# Patient Record
Sex: Male | Born: 1935
Health system: Southern US, Community
[De-identification: ages and names within clinical notes are randomized; demographics above are authoritative.]

## PROBLEM LIST (undated history)

## (undated) DIAGNOSIS — C801 Malignant (primary) neoplasm, unspecified: Secondary | ICD-10-CM

## (undated) DIAGNOSIS — F411 Generalized anxiety disorder: Secondary | ICD-10-CM

## (undated) DIAGNOSIS — D649 Anemia, unspecified: Secondary | ICD-10-CM

## (undated) DIAGNOSIS — M1990 Unspecified osteoarthritis, unspecified site: Secondary | ICD-10-CM

## (undated) DIAGNOSIS — K219 Gastro-esophageal reflux disease without esophagitis: Secondary | ICD-10-CM

## (undated) DIAGNOSIS — I10 Essential (primary) hypertension: Secondary | ICD-10-CM

## (undated) HISTORY — DX: Malignant (primary) neoplasm, unspecified: C80.1

## (undated) HISTORY — DX: Unspecified osteoarthritis, unspecified site: M19.90

## (undated) HISTORY — DX: Gastro-esophageal reflux disease without esophagitis: K21.9

## (undated) HISTORY — DX: Anemia, unspecified: D64.9

## (undated) HISTORY — DX: Generalized anxiety disorder: F41.1

## (undated) HISTORY — DX: Essential (primary) hypertension: I10

---

## 1999-01-29 ENCOUNTER — Ambulatory Visit (HOSPITAL_COMMUNITY): Admission: RE | Admit: 1999-01-29 | Discharge: 1999-01-29 | Payer: Self-pay | Admitting: Cardiovascular Disease

## 2002-09-29 ENCOUNTER — Ambulatory Visit (HOSPITAL_COMMUNITY): Admission: RE | Admit: 2002-09-29 | Discharge: 2002-09-29 | Payer: Self-pay | Admitting: Internal Medicine

## 2002-09-29 ENCOUNTER — Encounter: Payer: Self-pay | Admitting: Internal Medicine

## 2002-10-27 ENCOUNTER — Ambulatory Visit (HOSPITAL_COMMUNITY): Admission: RE | Admit: 2002-10-27 | Discharge: 2002-10-28 | Payer: Self-pay | Admitting: Internal Medicine

## 2004-04-10 ENCOUNTER — Inpatient Hospital Stay (HOSPITAL_COMMUNITY): Admission: EM | Admit: 2004-04-10 | Discharge: 2004-04-15 | Payer: Self-pay | Admitting: Internal Medicine

## 2004-05-10 ENCOUNTER — Encounter: Admission: RE | Admit: 2004-05-10 | Discharge: 2004-05-10 | Payer: Self-pay | Admitting: Orthopedic Surgery

## 2004-06-06 ENCOUNTER — Encounter: Admission: RE | Admit: 2004-06-06 | Discharge: 2004-06-06 | Payer: Self-pay | Admitting: Orthopedic Surgery

## 2004-06-25 ENCOUNTER — Inpatient Hospital Stay (HOSPITAL_COMMUNITY): Admission: RE | Admit: 2004-06-25 | Discharge: 2004-06-29 | Payer: Self-pay | Admitting: Orthopedic Surgery

## 2004-06-25 ENCOUNTER — Encounter (INDEPENDENT_AMBULATORY_CARE_PROVIDER_SITE_OTHER): Payer: Self-pay | Admitting: Specialist

## 2004-07-13 ENCOUNTER — Encounter: Payer: Self-pay | Admitting: Internal Medicine

## 2004-07-22 ENCOUNTER — Ambulatory Visit: Payer: Self-pay | Admitting: Infectious Diseases

## 2004-07-25 ENCOUNTER — Ambulatory Visit (HOSPITAL_COMMUNITY): Admission: RE | Admit: 2004-07-25 | Discharge: 2004-07-25 | Payer: Self-pay | Admitting: Infectious Diseases

## 2004-08-12 ENCOUNTER — Ambulatory Visit: Payer: Self-pay | Admitting: Infectious Diseases

## 2004-09-16 ENCOUNTER — Ambulatory Visit: Payer: Self-pay | Admitting: Infectious Diseases

## 2004-11-18 ENCOUNTER — Ambulatory Visit: Payer: Self-pay | Admitting: Infectious Diseases

## 2004-11-21 ENCOUNTER — Ambulatory Visit: Payer: Self-pay | Admitting: Cardiology

## 2004-12-03 ENCOUNTER — Ambulatory Visit: Payer: Self-pay

## 2005-02-05 ENCOUNTER — Ambulatory Visit: Payer: Self-pay | Admitting: Cardiology

## 2005-02-20 ENCOUNTER — Ambulatory Visit: Payer: Self-pay

## 2005-03-06 ENCOUNTER — Ambulatory Visit: Payer: Self-pay | Admitting: Cardiology

## 2005-03-14 ENCOUNTER — Ambulatory Visit: Payer: Self-pay

## 2005-03-17 ENCOUNTER — Ambulatory Visit: Payer: Self-pay | Admitting: Infectious Diseases

## 2005-06-20 ENCOUNTER — Ambulatory Visit: Payer: Self-pay | Admitting: Internal Medicine

## 2005-09-22 ENCOUNTER — Ambulatory Visit: Payer: Self-pay | Admitting: Infectious Diseases

## 2006-03-30 ENCOUNTER — Ambulatory Visit: Payer: Self-pay | Admitting: Infectious Diseases

## 2006-06-24 ENCOUNTER — Ambulatory Visit: Payer: Self-pay | Admitting: Cardiology

## 2006-08-25 ENCOUNTER — Other Ambulatory Visit: Payer: Self-pay

## 2006-08-25 ENCOUNTER — Emergency Department: Payer: Self-pay | Admitting: Unknown Physician Specialty

## 2006-10-20 DIAGNOSIS — I2510 Atherosclerotic heart disease of native coronary artery without angina pectoris: Secondary | ICD-10-CM

## 2006-10-20 DIAGNOSIS — I499 Cardiac arrhythmia, unspecified: Secondary | ICD-10-CM | POA: Insufficient documentation

## 2006-10-20 DIAGNOSIS — I10 Essential (primary) hypertension: Secondary | ICD-10-CM | POA: Insufficient documentation

## 2006-10-20 DIAGNOSIS — M009 Pyogenic arthritis, unspecified: Secondary | ICD-10-CM | POA: Insufficient documentation

## 2006-10-22 ENCOUNTER — Encounter (INDEPENDENT_AMBULATORY_CARE_PROVIDER_SITE_OTHER): Payer: Self-pay | Admitting: Infectious Diseases

## 2006-11-16 ENCOUNTER — Ambulatory Visit: Payer: Self-pay | Admitting: Infectious Diseases

## 2007-04-19 ENCOUNTER — Telehealth: Payer: Self-pay | Admitting: Internal Medicine

## 2007-06-17 ENCOUNTER — Ambulatory Visit: Payer: Self-pay | Admitting: Infectious Diseases

## 2007-06-24 ENCOUNTER — Ambulatory Visit: Payer: Self-pay | Admitting: Cardiology

## 2008-08-11 ENCOUNTER — Ambulatory Visit: Payer: Self-pay | Admitting: Cardiology

## 2008-08-22 ENCOUNTER — Ambulatory Visit: Payer: Self-pay

## 2009-01-22 ENCOUNTER — Telehealth: Payer: Self-pay | Admitting: Cardiology

## 2009-05-23 ENCOUNTER — Encounter (INDEPENDENT_AMBULATORY_CARE_PROVIDER_SITE_OTHER): Payer: Self-pay | Admitting: *Deleted

## 2009-06-25 ENCOUNTER — Encounter: Payer: Self-pay | Admitting: Cardiology

## 2009-08-16 DIAGNOSIS — K219 Gastro-esophageal reflux disease without esophagitis: Secondary | ICD-10-CM | POA: Insufficient documentation

## 2009-08-16 DIAGNOSIS — M1990 Unspecified osteoarthritis, unspecified site: Secondary | ICD-10-CM | POA: Insufficient documentation

## 2009-08-16 DIAGNOSIS — E785 Hyperlipidemia, unspecified: Secondary | ICD-10-CM

## 2009-08-16 DIAGNOSIS — D649 Anemia, unspecified: Secondary | ICD-10-CM | POA: Insufficient documentation

## 2009-08-16 DIAGNOSIS — F411 Generalized anxiety disorder: Secondary | ICD-10-CM | POA: Insufficient documentation

## 2009-08-17 ENCOUNTER — Ambulatory Visit: Payer: Self-pay | Admitting: Cardiology

## 2009-12-25 ENCOUNTER — Encounter: Payer: Self-pay | Admitting: Cardiology

## 2010-08-01 ENCOUNTER — Encounter: Payer: Self-pay | Admitting: Cardiology

## 2010-08-20 ENCOUNTER — Ambulatory Visit: Payer: Self-pay | Admitting: Cardiology

## 2010-11-03 ENCOUNTER — Encounter: Payer: Self-pay | Admitting: Orthopedic Surgery

## 2010-11-14 NOTE — Assessment & Plan Note (Signed)
Summary: yearly   CC:  check up.  History of Present Illness: [name] is a very pleasant gentleman who has a history of coronary artery disease status post coronary bypassing graft in May 1997.  He also has a history of atrial flutter ablation. Last Myoview was performed in November of 2009. There was mild apical thinning but no scar or ischemia. Ejection fraction was 60%. Previous abdominal ultrasound in May 2006 showed no significant renal artery stenosis and normal caliber aorta.  I last saw him in Nov 2010. Since then he denies any dyspnea on exertion, orthopnea, PND, pedal edema, palpitations, syncope or exertional chest pain.  Current Medications (verified): 1)  Labetalol Hcl 200 Mg Tabs (Labetalol Hcl) .Marland Kitchen.. 1 By Mouth Daily 2)  Ranitidine Hcl 150 Mg Caps (Ranitidine Hcl) .Marland Kitchen.. 1 By Mouth Two Times A Day 3)  Amlodipine Besylate 10 Mg Tabs (Amlodipine Besylate) .Marland Kitchen.. 1 By Mouth Daily 4)  Klor-Con M20 20 Meq Cr-Tabs (Potassium Chloride Crys Cr) .Marland Kitchen.. 1 By Mouth Daily 5)  Lorazepam 2 Mg Tabs (Lorazepam) .... 1/2 By Mouth At Bedtime 6)  Aspirin 81 Mg  Tabs (Aspirin) .Marland Kitchen.. 1 By Mouth Daily 7)  Doxazosin Mesylate 2 Mg Tabs (Doxazosin Mesylate) .Marland Kitchen.. 1 By Mouth Daily 8)  Lipitor 40 Mg Tabs (Atorvastatin Calcium) .Marland Kitchen.. 1 By Mouth Daily 9)  Diazepam 2 Mg Tabs (Diazepam) .Marland Kitchen.. 1 By Mouth As Needed 10)  Losartan Potassium-Hctz 100-25 Mg Tabs (Losartan Potassium-Hctz) .Marland Kitchen.. 1 By Mouth Daily 11)  Eye Drops .... As Directed  Allergies: No Known Drug Allergies  Past History:  Past Medical History: HYPERTENSION (ICD-401.9) CORONARY ARTERY DISEASE (ICD-414.00) HYPERLIPIDEMIA (ICD-272.4) ANEMIA (ICD-285.9) ANXIETY (ICD-300.00) GERD (ICD-530.81) OSTEOARTHRITIS (ICD-715.90) Group B streptococcal septic arthritis  Past Surgical History: Status post coronary bypassing graft in May 1997.He had a LIMA to the LAD, a sequential  saphenous vein graft to the diagonal and obtuse marginal and a saphenous vein  graft to the right coronary artery and a saphenous vein graft to the second diagonal.  His most recent Myoview was performed in February  2006.  His ejection fraction was 57%.  There is apical thinning versus a small prior infarct, but there was no ischemia  History of atrial flutter ablation.  Right total hip replacement.  Social History: Reviewed history from 08/17/2009 and no changes required. Tobacco Use - No.  Alcohol Use - yes  Review of Systems       Problems with Low Back and Hip Pain but no fevers or chills, productive cough, hemoptysis, dysphasia, odynophagia, melena, hematochezia, dysuria, hematuria, rash, seizure activity, orthopnea, PND, pedal edema, claudication. Remaining systems are negative.   Vital Signs:  Patient profile:   77 year old male Height:      68 inches Weight:      205 pounds BMI:     31.28 Pulse rate:   75 / minute Resp:     14 per minute BP sitting:   115 / 69  (left arm)  Vitals Entered By: Kem Parkinson (August 20, 2010 10:27 AM)  Physical Exam  General:  Well-developed well-nourished in no acute distress.  Skin is warm and dry.  HEENT is normal.  Neck is supple. No thyromegaly.  Chest is clear to auscultation with normal expansion.  Cardiovascular exam is regular rate and rhythm.  Abdominal exam nontender or distended. No masses palpated. Extremities show no edema. neuro grossly intact    Impression & Recommendations:  Problem # 1:  HYPERTENSION (ICD-401.9) Blood pressure  controlled on present medications. Will continue. Potassium and renal function monitored by primary care. His updated medication list for this problem includes:    Labetalol Hcl 200 Mg Tabs (Labetalol hcl) .Marland Kitchen... 1 by mouth daily    Amlodipine Besylate 10 Mg Tabs (Amlodipine besylate) .Marland Kitchen... 1 by mouth daily    Aspirin 81 Mg Tabs (Aspirin) .Marland Kitchen... 1 by mouth daily    Doxazosin Mesylate 2 Mg Tabs (Doxazosin mesylate) .Marland Kitchen... 1 by mouth daily    Losartan  Potassium-hctz 100-25 Mg Tabs (Losartan potassium-hctz) .Marland Kitchen... 1 by mouth daily  Problem # 2:  CORONARY ARTERY DISEASE (ICD-414.00) Continue aspirin, beta blocker and statin. Plan Myoview when he returns in one year. His updated medication list for this problem includes:    Labetalol Hcl 200 Mg Tabs (Labetalol hcl) .Marland Kitchen... 1 by mouth daily    Amlodipine Besylate 10 Mg Tabs (Amlodipine besylate) .Marland Kitchen... 1 by mouth daily    Aspirin 81 Mg Tabs (Aspirin) .Marland Kitchen... 1 by mouth daily  Problem # 3:  HYPERLIPIDEMIA (ICD-272.4) Continue statin. Lipids and liver monitored by primary care. His updated medication list for this problem includes:    Lipitor 40 Mg Tabs (Atorvastatin calcium) .Marland Kitchen... 1 by mouth daily  Problem # 4:  GERD (ICD-530.81)  His updated medication list for this problem includes:    Ranitidine Hcl 150 Mg Caps (Ranitidine hcl) .Marland Kitchen... 1 by mouth two times a day  Patient Instructions: 1)  Your physician recommends that you continue on your current medications as directed. Please refer to the Current Medication list given to you today. 2)  Your physician wants you to follow-up in: 1 year. You will receive a reminder letter in the mail two months in advance. If you don't receive a letter, please call our office to schedule the follow-up appointment.

## 2011-02-25 NOTE — Assessment & Plan Note (Signed)
[facility]                            CARDIOLOGY OFFICE NOTE   DARRELLE, WIBERG                    MRN:          [id number]  DATE:08/11/2008                            DOB:          April 27, 1933    Mr. Cartaya is a very pleasant gentleman who has a history of coronary  artery disease status post coronary bypassing graft in May 1997.  He  also has a history of atrial flutter ablation.  Since I last saw him, he  is doing well from a symptomatic standpoint.  There is no dyspnea, chest  pain, palpitations, or syncope, and there is no pedal edema.  Note, he  does not smoke.  He is exercising routinely and following a diet.   His medications include:  1. Lipitor 40 mg p.o. daily.  2. Potassium 20 mEq p.o. daily.  3. Labetalol 200 mg p.o. b.i.d.  4. Doxazosin 2 mg p.o. daily.  5. Lorazepam 1 mg p.o. daily.  6. Benicar/hydrochlorothiazide 40/25 daily.  7. Aspirin 81 mg p.o. daily.  8. Zantac 150 mg p.o. daily.  9. Norvasc 10 mg p.o. daily.   PHYSICAL EXAMINATION:  VITAL SIGNS:  Blood pressure 115/70, his pulse is  63.  He weighs 205 pounds.  HEENT:  Normal.  NECK:  Supple with no bruits.  CHEST:  Clear.  CARDIOVASCULAR:  Regular rate and rhythm.  ABDOMEN:  No tenderness.  EXTREMITIES:  No edema.   His electrocardiogram shows a sinus rhythm at a rate of 63.  There are  no ST changes noted.   DIAGNOSES:  1. Coronary artery disease status post coronary bypassing graft - Mr.      Arboleda is doing well from a symptomatic standpoint.  However, his      most recent Myoview was performed on December 03, 2004.  We will      therefore proceed with a Myoview for risk stratification.  If he      has no ischemia, then we will continue with medical therapy.  This      will include aspirin, statin, beta-blocker, and angiotensin      receptor blocker.  2. History of atrial flutter ablation.  3. Hypertension - his blood pressure is adequately controlled on  his      present medications.  I will have his most recent BMET forwarded to      [place] from Dr. Elisabeth Cara office.  4..  Hyperlipidemia - he will continue on his statins.  We will have his  most recent lipids and liver forwarded to [place].   The patient continue with risk factor modification.  We will see him  back in 1 year.     Madolyn Frieze Jens Som, MD, [facility]  Electronically Signed    BSC/MedQ  DD: 08/11/2008  DT: 08/12/2008  Job #: [id number]   cc:   Julieanne Manson

## 2011-02-25 NOTE — Assessment & Plan Note (Signed)
[facility]                            CARDIOLOGY OFFICE NOTE   [name]                    MRN:          [id number]  DATE:06/24/2007                            DOB:          1932-11-13    [name] is a very pleasant gentleman who has a history of coronary  disease status post coronary bypass and graft.  He has also had a  previous atrial flutter ablation.  Note his coronary bypass and graft  was performed in May 1997.  He had a LIMA to the LAD, a sequential  saphenous vein graft to the diagonal and obtuse marginal and a saphenous  vein graft to the right coronary artery and a saphenous vein graft to  the second diagonal.  His most recent Myoview was performed in February  2006.  His ejection fraction was 57%.  There is apical thinning versus a  small prior infarct, but there was no ischemia.  Since I last saw him,  he denies any dyspnea on exertion, orthopnea, PND, pedal edema,  palpitations, presyncope, syncope, or chest pain.   MEDICATIONS:  1. Keflex 500 mg p.o. b.i.d.  2. Lipitor 40 mg p.o. daily.  3. KCl 20 mEq p.o. daily.  4. Labetalol 20 mg p.o. b.i.d.  5. Doxazosin 2 mg p.o. daily.  6. Lorazepam 1 mg p.o. daily.  7. Benicar HCT 40/25 daily.  8. Aspirin 81 mg p.o. daily.  9. Zantac 150 mg p.o. b.i.d.  10.Norvasc 10 mg p.o. daily.   PHYSICAL EXAM:  Blood pressure 130/79.  His pulse is 71.  He weighs 219  pounds.  HEENT:  Normal.  NECK:  Supple with no bruits.  CHEST:  Clear.  CARDIOVASCULAR:  Regular rate and rhythm.  ABDOMEN:  Benign.  EXTREMITIES:  No edema.   His electrocardiogram shows sinus rhythm at a rate of 68.  There is left  axis deviation, but there are no ST changes noted.   DIAGNOSES:  1. Coronary artery disease status post coronary artery bypass and      graft - [name] is doing well from a symptomatic standpoint with      no chest pain or shortness of breath.  We will continue with      medical  therapy including his aspirin, statin, beta blocker, and      adrenergic receptor binder.  We will most likely perform a Myoview      when I see him back.  2. History of atrial flutter ablation - The patient remains in sinus      rhythm.  3. Hypertension - His blood pressure is well controlled on his present      medications.  4. Hyperlipidemia - He will continue on his statin.   The patient will continue with risk factor modification including diet  and exercise.  Note, he does not smoke.  I will have his most recent  laboratories forwarded to [place] from Dr. Elisabeth Fowler office including his  most recent renal function, liver functions, and cholesterol panel.  We  will see him back in [place] in 1 year.  Madolyn Frieze Richard Som, MD, [facility]  Electronically Signed    BSC/MedQ  DD: 06/24/2007  DT: 06/24/2007  Job #: [id number]   cc:   [name]

## 2011-02-28 NOTE — Op Note (Signed)
   [name], [name]                       ACCOUNT NO.:  [id number]   MEDICAL RECORD NO.:  [id number]                   PATIENT TYPE:  OIB   LOCATION:  2887                                 FACILITY:  [facility]   PHYSICIAN:  Duke Salvia, M.D. [facility]           DATE OF BIRTH:  [birth year]   DATE OF PROCEDURE:  09/29/2002  DATE OF DISCHARGE:                                 OPERATIVE REPORT   PREOPERATIVE DIAGNOSIS:  Atrial flutter.   POSTOPERATIVE DIAGNOSIS:  Sinus rhythm.   DESCRIPTION OF PROCEDURE:  The patient was sedated under the care of Dr.  Noreene Larsson, receiving 150 mg of sodium penthothal.  A 200-joule shock delivered  synchronously into atrial flutter resulted in termination of flutter and  restoration of sinus rhythm.   The patient tolerated the procedure without apparent complications.   The patient will be discharged.                                               Duke Salvia, M.D. [facility]    SCK/MEDQ  D:  09/29/2002  T:  09/30/2002  Job:  [id number]   cc:   Julieanne Manson  [address]  [place]  [place] [address]  Fax: [phone]

## 2011-02-28 NOTE — Assessment & Plan Note (Signed)
[facility]                              CARDIOLOGY OFFICE NOTE   Richard, Fowler                    MRN:          [id number]  DATE:06/24/2006                            DOB:          Feb 12, 1933    [name] is a pleasant gentleman who has a history of coronary artery  disease, status-post coronary artery bypass grafting.  Since I last saw him  there is no dyspnea, chest pain, palpitations, or syncope.  He is exercising  routinely and trying to follow a diet.  His medications include:  1. Cephalexin 500 mg p.o. b.i.d.  2. Lipitor 40 mg p.o. daily.  3. Potassium 20 mEq p.o. q day.  4. Labetalol 200 mg p.o. b.i.d.  5. Doxazosin 2 mg p.o. q day.  6. Zantac 300 mg p.o. b.i.d.  7. Lorazepam 1 mg p.o. q day.  8. Norvasc 5 mg p.o. q day.  9. Benicar HCT 40/25 q day.  10.Aspirin 81 mg p.o. q day.   PHYSICAL EXAM:  GENERAL:  Exam today shows a blood pressure of 130/80 and a  pulse of 67.  NECK:  His neck is supple with no bruits.  CHEST:  His chest is clear.  CARDIOVASCULAR:  His cardiovascular exam is regular rate and rhythm.  EXTREMITIES:  His extremities show no edema.   His echocardiogram shows normal sinus rhythm at a rate of 67.  There are no  significant ST changes noted.   DIAGNOSES:  1. Coronary artery disease status-post coronary artery bypass grafting.  2. History of atrial flutter ablation.  3. Hypertension.  4. Hyperlipidemia.   PLAN:  [name] is doing well form a symptomatic standpoint with no chest  pain or shortness of breath.  His blood pressure is well-controlled today.  His Lipitor was recently increased and we will have his most-recent lipids  and liver forwarded to [place] for our records.  Our goal LDL  should be less than 70 given his history of coronary disease.  He will  continue with his exercise program and we discussed the importance of diet.  He will see [place] back in 12 months.     Madolyn Frieze Richard Som, MD, [facility]    BSC/MedQ  DD:  06/24/2006  DT:  06/24/2006  Job #:  [id number]   cc:   Julieanne Manson

## 2011-02-28 NOTE — Discharge Summary (Signed)
NAMECLARENCE, [name]                       ACCOUNT NO.:  [id number]   MEDICAL RECORD NO.:  [id number]                   PATIENT TYPE:  INP   LOCATION:  0472                                 FACILITY:  [facility]   PHYSICIAN:  Madlyn Frankel. Charlann Boxer, M.D.               DATE OF BIRTH:  [birth year]   DATE OF ADMISSION:  06/25/2004  DATE OF DISCHARGE:  06/29/2004                                 DISCHARGE SUMMARY   ADMISSION DIAGNOSES:  1.  Severe osteoarthritis of the right hip.  2.  Gastroesophageal reflux disease.  3.  Hypertension.  4.  Status post ablation for arrhythmias.  5.  Anxiety.   DISCHARGE DIAGNOSES:  1.  Severe osteoarthritis of the right hip.  2.  Gastroesophageal reflux disease.  3.  Hypertension.  4.  Status post ablation for arrhythmias.  5.  Anxiety.  6.  Postoperative anemia.   OPERATION:  On June 25, 2004, the patient underwent right total hip  replacement arthroplasty with metal on metal system, D.L. Idolina Primer, P.A.-C.  assisted.   BRIEF HISTORY:  This 77 year old white male with progressive and  deteriorating __________ arthritis of the  right hip has had more and more  difficulty getting about. He is a relatively young man as far as his day to  day activity is concerned but he has had interference with his activities  and is quite upset with this. He was cleared preoperatively both by his  cardiologist at [facility] as well as Julieanne Manson and his family  physician in [place].  He has had Cortizone injections to the hip but  this has not helped him at all. After failed conservative measures and  severe osteoarthritis noted on x-ray and MRI.  It was felt that he would  benefit from surgical intervention and was admitted for the above procedure.   It was noted that he had complete destruction of the femoral head when x-  rays compared between July of this year and September.  It was planned to  have cultures done at the time of surgery.   HOSPITAL COURSE:  The patient tolerated the surgical procedure quite well.  Intraoperative cultures were taken, the Gram's stain was negative; however,  isolated cultures showed group B strep sensitive to penicillin, ampicillin,  vancomycin.  The patient was on vancomycin throughout his hospital stay.  The patient had a moderate amount of pain  postoperatively which was  expected but had marked relief in his overall discomfort that he had  preoperatively.  He was able to perform physical therapy howbeit quite  slowly.  The patient developed a pronounced postoperative anemia with a  hemoglobin of 7.6, hematocrit was 23.0.  We transfused 3 units of bank blood  bringing his hemoglobin up to 10.7, hematocrit was 31.7.  The patient felt  better after his transfusion.   The patient was placed on Coumadin protocol, the calf remained soft  postoperatively and neurovascularly  he remained intact in the operative  extremity and wound remained dry.   The patient had made arrangements preoperatively to go to [facility] nursing  facility in [place].  The family was in favor of this as the patient is  single/divorced and lives alone.  All of his family works.  We therefore  made that our direction. It was found out that they could receive the  patient on a Saturday if the discharge summary was done today.  We therefore  anticipate his transfer there tomorrow.   Laboratory values in the hospital showed a CBC preoperatively showing a mild  anemia with hemoglobin of 10.9, hematocrit 32.3.  Final hemoglobin was as  mentioned above.  Blood chemistries stayed essentially normal.  The Gram's  smear was negative with no organism seen; however, the culture showed few  group B strep isolated.  Susceptibility to ampicillin, penicillin and  vancomycin was noted.  Chest x-ray showed no acute cardiopulmonary findings,  streaky areas of atelectasis with scarring change and post surgical changes  related to a bypass  surgery.  Electrocardiogram showed normal sinus rhythm.   CONDITION ON ANTICIPATED DISCHARGE:  Improved and stable.   PLAN:  The patient is transferred to [facility] by ambulance for his safety.  He was to continue with his home medications and diet under the direction of  Dr. Sullivan Lone.   DISCHARGE MEDICATIONS:  1.  Amoxicillin 500 mg, #50, 1 q.8 hours.  2.  Trinsicon #60, 1 b.i.d.  3.  Robaxin 500 mg #30, 1 q.6 hours.  4.  Vicodin #60, 1-2q. 4-6 hours p.r.n. pain.  5.  Prescriptions written for a walker or crutches and 3 in 1 commode. Also      a prescription written for Coumadin protocol to keep the INR around 2      for four weeks after date of surgery.   Dry dressing to the hip may be used p.r.n.  The patient is to continue his  hip precautions and is 50% weightbearing of the right lower extremity. TED  hose may be used until he is really up and walking. Return to see Dr. Charlann Boxer  approximately 2-3 weeks after date of surgery. He may shower four days from  the surgical date, use dry dressing p.r.n.     Dooley L. Cherlynn June.                 Madlyn Frankel Charlann Boxer, M.D.    DLU/MEDQ  D:  06/28/2004  T:  06/28/2004  Job:  [id number]   cc:   Julieanne Manson  [address]  [place]  [place] [address]  Fax: [phone]

## 2011-02-28 NOTE — Op Note (Signed)
[name], [name]                       ACCOUNT NO.:  [id number]   MEDICAL RECORD NO.:  [id number]                   PATIENT TYPE:  INP   LOCATION:  0472                                 FACILITY:  [facility]   PHYSICIAN:  Madlyn Frankel. Charlann Boxer, M.D.               DATE OF BIRTH:  September 16, 1933   DATE OF PROCEDURE:  06/25/2004  DATE OF DISCHARGE:                                 OPERATIVE REPORT   PREOPERATIVE DIAGNOSIS:  Right hip arthritis.   POSTOPERATIVE DIAGNOSIS:  Right hip arthritis associated with significant  collapse of the femoral head associated with a marked inflammatory process  within the synovium.   PROCEDURE:  Right total hip replacement.   COMPONENTS USED:  DePuy hip system with a 52 mm Pinnacle cup, a 36 neutral  metal liner, a 2015 36+8 Ashrom stem with a 20D large sleeve and a 36+0  metal ball.   SURGEON:  Madlyn Frankel. Charlann Boxer, M.D.   ASSISTANTDruscilla Brownie. Underwood, P.A.-C.   ANESTHESIA:  Spinal plus MAC.   BLOOD LOSS:  400.   IV FLUIDS:  Two liters.   DRAINS:  One.   COMPLICATIONS:  None apparent.   INDICATIONS FOR PROCEDURE:  [name] is a pleasant 77 year old gentleman  who has been followed over the past several months with an initial  consultation in the [facility] setting.  This is for acute onset of hip  pain.  The patient is followed extensively with extensive workup and  evaluation, which includes two MRIs, hip injection, two separate cortisone  injections in an effort to provide relief.  It has been prescribed to Mr.  Fowler that his presentation is rather atypical; however, based on his  presentation and failure of conservative measures and review of his  radiographs, it was concluded that he had a significant degenerative joint  disease process that was going to require surgical intervention.   Of note, in the interval between my last office visit and the procedure  today, radiographs were obtained at [facility] per protocol.  These  radiographs had indicated some interval collapse of the femoral head,  indicating avascular necrosis.  Upon review of these radiographs directly,  there was an incredibly significant difference between the x-rays that had  been obtained in our office two months ago to now.  Nonetheless, prior to  today's surgical procedure, risks and benefits of the procedure had been  outlined, and this included to the atypical presentation, including risks of  infection, hip dislocation, prostatic failure.  We did discuss bearing  surfaces, given his age and health as well as his activity level.  We  discussed bearing options for hard-bearing surfaces for potential longevity.  He decided he would like a metal component.  We also reviewed DVT  complications of anesthesia and nerve injury.  After reviewing these risks,  he consented for the procedure.   PROCEDURE IN DETAIL:  Patient was brought to  the operating theater.  Once  adequate anesthesia and preoperative antibiotics with 1 gm of Ancef were  administered, the patient was positioned in the left lateral decubitus  position with the right side up.   Please note that prior to the onset of the procedure, review of his  radiographs indicated a pretty significant and progressive collapse of the  femoral head.  Given the interval two month period of significant collapse,  I was concerned about the possibility of infection cause this versus some  avascular necrosis process or an inflammatory process.  For this nature, the  approach to the hip was carefully carried out with the idea of potentially  irrigating the hip with an open capsulotomy and closing it up if infection  was present.  Following exposure of the hip in a standard fashion with a  lateral-based incision, posterior approach to the hip and short external  rotators were taken down separate, at first, just the conjoint tendon and  piriformis.  With the capsule exposed, a capsulotomy was made.   There was  noted to be an enlarged effusion prior to entering the joint; however, upon  entering the joint, the fluid that was expressed was blood-based.  There was  no evidence of any purulent material.  There was evidence of debris,  probably cartilage fragments.  Despite this, several samples of fluid were  sent to pathology for a STAT review of Gram's stain culture.  Given the  significant process in the hip, the decision was made to proceed with the  case, the idea being that if it was infected, this would need to be treated  anyway, and that we would plan on placing his antibiotic-laden cemented  total hip components, both femoral and acetabulum, treat this for six weeks,  and return for a total hip replacement; however, upon the cultures coming  back negative, no evidence of any bacteria, and we would proceed with total  hip.  With this in mind, a neck osteotomy was made, based off the anatomic  landmarks and review of his radiographs on the contralateral side.  Attention was first directed to the acetabulum.  The patient's hip joint was  noted to be extensively involved with a boggy, inflamed, and hyperemic  synovium.  This was extensive around the joint.  This was also noted  preoperatively on MRIs.  Following debridement of the labrum and some of the  synovial lining, the acetabular cartilage was reamed.  Reaming commenced  with a 45 reamer and was carried up to a 51 reamer.  A 52 mm cup was then  impacted into position.  The two cancellous bone screws were then placed.  A  trial liner was placed.  At this point, attention was directed to the  femoral side.  The femur was prepared per Ashrom protocol.  Note please that  in the interval, the STAT Gram's stain came back as being negative for  bacteria and rare white blood cells.  With this in mind, it was felt that,  based on the blood-tinged fluid and lack of clinical concern for infection, we proceed with the case, as it was  noninfected.  The femur was prepared for  Ashrom component with distal reaming to axial reaming to 15-1/2, three-  quarters of the way down.  The proximal femur was prepared with reaming to a  size D.  Milling was carried out to a large.  The trial stem was placed.  Note that the sleeve was positioned to  approximately 15 degrees anteversion  relationship to the leg.  The stem was placed in a slight bit more of  anteversion.  Trial reduction was carried out and the hip found to be very  stable with a 36+0 ball.  At this point, trial components were removed.  A  final metal liner was placed.  Note that the acetabular position was  approximately 40 degrees of abduction and 15 degrees of anteversion.  This  was positioned in such a way to be just beneath the anterior wall.  A final  20D large sleeve was positioned, followed by the placement of a 2215 36+8  sleeve with approximately 5 degrees of anteversion added to the position of  the sleeve.  The final 36 posterior ball was then impacted onto a clean and  dried trunnion, and the hip reduced.  The hip was copiously irrigated, and  further debridement of any soft tissue was carried out.  The posterior  capsule was unable to be reapproximated to itself due to the debridement and  not able to be reapproximated to the posterior trochanter due to distance.  For this reason, it was excised.  The short external rotators were  reapproximated to the posterior gluteus medius tendon.  Following this, the  medium Hemovac drain was placed.  The iliotibial band was reapproximated  using #1 Ethibond, and the gluteus maximus fascia with a #1 Vicryl.  The  remainder of the wound was closed in layers with a 4-0  Monocryl in the skin.  The patient's hip was cleaned, dried, and dressed  sterilely with Steri-Strips, dressings, sponges, and tape.  The patient was  transferred to the recovery room in stable condition, tolerating the  procedure without  difficulty.                                               Madlyn Frankel Charlann Boxer, M.D.    MDO/MEDQ  D:  06/25/2004  T:  06/26/2004  Job:  [id number]

## 2011-02-28 NOTE — Discharge Summary (Signed)
NAMEWACO, FOERSTER                       ACCOUNT NO.:  [id number]   MEDICAL RECORD NO.:  [id number]                   PATIENT TYPE:  INP   LOCATION:  0472                                 FACILITY:  [facility]   PHYSICIAN:  Madlyn Frankel. Charlann Boxer, M.D.               DATE OF BIRTH:  [birth year]   DATE OF PROCEDURE:  DATE OF DISCHARGE:                      STAT - MUST CHANGE TO CORRECT WORK TYPE   ADDENDUM   DISCHARGE MEDICATIONS:  Robaxin 500 mg 1 p.o. q.6h. p.r.n. muscle spasm,  Restoril 15 mg h.s. p.r.n. sleep, Reglan 10 to 20 mg p.o. q.6h. p.r.n.  nausea, Tylenol 1-2 p.r.n., Percocet 5/325 1-2 q.4h. p.r.n. pain, Norvasc 10  mg 1 daily, Lotensin 20 mg 1 daily, Ativan 2 mg tab 1 daily, Lopressor 25 mg  1 b.i.d., Protonix 40 mg 1 daily w.c., Trinsicon 1 cap b.i.d., Coumadin  protocol per pharmacy to maintain INR at 2.  He is started on Amoxicillin  500 mg 1 b.i.d.   Please note that prescriptions written for amoxicillin, Trinsicon, Robaxin,  and Vicodin should accompany the patient.     Dooley L. Cherlynn June.                 Madlyn Frankel Charlann Boxer, M.D.    DLU/MEDQ  D:  06/28/2004  T:  06/28/2004  Job:  [id number]   cc:   Julieanne Manson  [address]  [place]  [place] [address]  Fax: [phone]

## 2011-02-28 NOTE — Discharge Summary (Signed)
[name], [name]                       ACCOUNT NO.:  [id number]   MEDICAL RECORD NO.:  [id number]                   PATIENT TYPE:  INP   LOCATION:  5709                                 FACILITY:  [facility]   PHYSICIAN:  Ellie Lunch, M.D.                   DATE OF BIRTH:  30-Dec-1932   DATE OF ADMISSION:  04/10/2004  DATE OF DISCHARGE:  04/15/2004                                 DISCHARGE SUMMARY   PRIMARY CARE PHYSICIAN:  Dr. Julieanne Manson in [facility],  [phone].   CONSULTATIONS:  1. Dr. Donalee Citrin who is a neurosurgeon with [facility], [phone].  2. Dr. Durene Romans who is an orthopedist, [phone].   DISCHARGE DIAGNOSES:  1. Back pain secondary to spinal stenosis.  2. Right hip effusion most likely inflammatory in nature.  3. Elevated liver function tests most likely secondary to changing Pravachol     to Lipitor.   PAST MEDICAL HISTORY:  1. Coronary artery disease, CABG eight years ago.  2. Atrial flutter status post ablation in January 2004.  3. Hypertension.  4. Hyperlipidemia.  5. Arthritis.  6. Chronic low back pain.   DISCHARGE MEDICATIONS:  1. Percocet 5/325 1 to 2 tablets every 6 hours as needed for pain.  2. Colace 100 mg p.o. b.i.d.  3. Norvasc 10 mg p.o. daily.  4. Lotensin 20 mg p.o. daily.  5. Prilosec 20 mg p.o. b.i.d. for 14 weeks and then p.o. daily.  6. Ativan 0.5 mg at bedtime.  7. Lopressor 25 mg p.o. b.i.d.  8. Cardura 8 mg p.o. q.i.d.  9. Prednisone 10 mg p.o. daily for the next 3 days.  10.      Naproxen 500 mg p.o. b.i.d. for 2 weeks.   PROCEDURES DONE:  1. Right hip aspiration performed by Dr. Wendi Snipes who is an interventional     radiologist on April 13, 2004.  Indicated synovial fluid which had a smear     positive for polymorphonuclear cells, but negative for organisms and     fluid was also negative for crystals.  2. Cortisone injection to the right hip performed on April 14, 2004 after     infection was ruled out.  3. Right upper quadrant ultrasound performed on April 12, 2004 showed a small     amount of layering sludge in the gallbladder and a left renal cyst, no     ascites and pancreas largely obscured by overlying bowel gas.   ADMISSION HISTORY AND PHYSICAL:  [name] is a 77 year old white male with  a history of chronic back pain for several years who was transferred from  [facility] to [facility] on April 10, 2004 in  order to obtain a neurosurgery consult.  He was admitted in [facility] on April 06, 2004 because of excruciating back pain that was not  relieved by Vicodin.  During the hospitalization  he subsequently got an MRI  of the back which revealed spinal stenosis as well as MRI of the right hip  with effusion and inflammatory changes.  The patient requested neurosurgical  evaluation which was not available at [facility] and thus the patient was  transferred to [facility] for neurosurgical evaluation.  Admission vital signs -- pulse 85, blood pressure 148/75, temperature 97.7,  respirations 16, O2 saturation is  94% on room air.  Pertinent positive exam  findings -- tenderness on external internal rotation of the right hip and  tender to palpation on the back from L1 downwards.   ADMISSION LABS:  Sodium 134, potassium 4, chloride 105, bicarb 24, BUN 27,  creatinine 1.1, glucose 122, bilirubin 0.6, alkaline phosphatase 173, SGOT  93, SGPT 241, protein 6, albumin 2.2, calcium 7.8, hemoglobin 13.5, white  blood count 10.8, platelets 274,000.   HOSPITAL COURSE:  1. Back pain secondary to spinal stenosis.  Since the patient was     transferred here to obtain a neurosurgical consult, Dr. Donalee Citrin from     [facility] was consulted.  He evaluated the patient and     recommended outpatient followup for nerve blocks to relieve the pain and     the patient has been advised to follow up with Dr. Wynetta Emery as an outpatient.     PT and OT was also  consulted and provided him with a walker and on the     day of discharge the patient has minimal pain on ambulation for basic     activities.  The pain is also well-controlled on Percocet tablets p.r.n.     The patient has also been advised to keep the MRI films of his back in     case they need to be referred to for any evaluation in the future.  2. Right hip pain:  Since MRI done in [facility]     indicated a right hip effusion, and the patient had severe pain on     internal and external rotation of the right hip, orthopedics was     consulted and Dr. Charlann Boxer evaluated the patient and recommended an     aspiration of the right hip, since the patient was started on Lovenox,     the patient underwent the right hip aspiration two days after holding the     Lovenox.  The aspiration was performed by Dr. Wendi Snipes who was an     interventional radiologist and only 2 to 3 drops of straw colored fluid     were drawn and sent for Gram stain and culture, the Gram smear was     positive for polymorphonuclear cells, but no organisms and no crystals     were seen.  Culture results are pending.  Since Gram smear of the     aspiration ruled out infection, a cortisone injection was performed by     Interventional Radiology into the right hip.  Post injection the patient     has much relief in the right hip and has increased mobility in the     joints.  He is to follow up with Dr. Charlann Boxer in the next week for followup.     His pain is well-controlled on Percocet p.r.n.  3. Elevated liver function tests:  A CMET was obtained as part of admission     labs on day of admission and the liver function tests were elevated.  The     primary  physician's office was contacted and liver function tests were     normal in December of 2004, thus further workup was warranted.  The     patient received the following tests -- a hepatis panel which was    negative, antimitochondrial antibodies for primary  biliary cirrhosis     which were negative, ferritin was elevated at 660 to evaluate for     hemachromatosis, thus a followup transferring saturation was obtained     which was 30% which thus ruled out hemachromatosis, finally a right upper     quadrant ultrasound was performed with findings as indicated above.  Thus     the elevated liver function tests was thought to be secondary to changing     the patient's statin regimen from Pravachol to Lipitor in December and     followup liver function tests are recommended to be done in four weeks by     the primary care physician.  A fasting lipid panel was obtained during     the hospitalization and was as follows:  cholesterol 114, triglyceride     66, HDL 39, LDL 62, the patient's Lipitor has been discontinued for the     moment and can be restarted by the primary care physician depending on     the liver function tests obtained during followup.   DISPOSITION AND FOLLOWUP:  The patient is to follow up with Dr. Charlann Boxer in one  week for follow up on the right hip effusion.  He is to follow up with Dr.  Donalee Citrin in [place] for the nerve blocks for back pain due to spinal  stenosis.  Finally, the patient is to follow up with the primary care  Sammi Stolarz, namely Dr. Sullivan Lone in two to three weeks for follow up on the  elevated liver function tests, and restarting the statin as indicated.   DISCHARGE LABS:  Sodium 134, potassium 4.6, chloride 100, bicarb 27, BUN 31,  creatinine 1.2, glucose 137, alkaline phosphatase 230, AST 69, ALT 221,  total protein 6.5, albumin 2.1, hemoglobin 12.9, WBC count 9.2, platelet  count 426,000.  C-reactive protein of 17.1, ESR of 83, cholesterol 114,  triglyceride 66, HDL 39, LDL 62.                                                Ellie Lunch, M.D.    PB/MEDQ  D:  04/15/2004  T:  04/16/2004  Job:  [id number]

## 2011-02-28 NOTE — H&P (Signed)
NAME:  [name], [name]                       ACCOUNT NO.:  [id number]   MEDICAL RECORD NO.:  [id number]                   PATIENT TYPE:  INP   LOCATION:  NA                                   FACILITY[facility]   PHYSICIAN:  Madlyn Frankel. Charlann Boxer, M.D.               DATE OF BIRTH:  [birth year]   DATE OF ADMISSION:  DATE OF DISCHARGE:                                HISTORY & PHYSICAL   CHIEF COMPLAINT:  [name] is a 77 year old male who comes in complaining  about right hip pain.   HISTORY OF PRESENT ILLNESS:  The patient states that his right hip pain  began fairly suddenly back in April 07, 2004.  He has tried multiple  Cortisone injections and physical therapy to help relieve his right hip  pain, and lumbar back pain has been ruled out as a source of his right hip  pain.  The patient's symptoms have been refractory to all these measures,  and thus he is electively selected right total hip arthroplasty to be  completed by Dr. Durene Romans.   ALLERGIES:  No known drug allergies.   MEDICATIONS:  1.  Lotrel 10/20 mg one tab daily.  2.  Vicodin 5 mg q.4-6h. p.r.n.  3.  Prilosec 20 mg daily.  4.  Metoprolol 50 mg 1-1/2 tablets daily.  5.  Diazepam 2 mg q.h.s. p.r.n.  6.  Naprosyn 500 mg b.i.d. to t.i.d. p.r.n.   PAST MEDICAL HISTORY:  1.  Bypass surgery in 1997.  2.  History of coronary artery disease.  3.  Hypertension.   FAMILY HISTORY:  Hypertension and congestive heart failure, coronary artery  disease, and diabetes.   REVIEW OF SYSTEMS:  The patient denies any bowel or bladder problems, any  problems eating or drinking.  No headaches, dizziness.  He does have painful  ambulation with that right hip and can only walk with a walker for very  short distances.  Denies any recent fevers, chills, or illnesses.   PHYSICAL EXAMINATION:  VITAL SIGNS:  Pulse was 92, respirations 18, blood  pressure 136/78.  GENERAL APPEARANCE:  The patient is a healthy 76 year old male in no acute  distress.  Pleasant mood and affect.  Alert and oriented x3.  HEENT:  Cranial nerves II-XII grossly intact.  NECK:  Full range of motion, no tenderness to palpation of the cervical,  thoracic, or lumbar spine.  No muscle spasms seen.  EXTREMITIES:  Bilateral upper extremities:  Full range of motion.  No gross  deformity.  Station grossly intact.  Grip strength is 5/5 bilaterally.  Reflexes are 2+ and equal bilaterally.  The patient has moderate pain with  right hip any type of internal/external rotation or weightbearing.  He has  no pedal edema.  Neurovascularly he is intact.  Skin is intact distally.  Deep tendon reflexes are 2+ and equal bilaterally in the Achilles.  CHEST:  Bilateral breath sounds are  equal.  No wheezes, rhonchi, or rales.  HEART:  Regular rate and rhythm, no murmur detected.  ABDOMEN:  Nontender, nondistended, active bowel sounds in all quadrants, no  pulsatile masses felt.  SKIN:  No rashes.  Completely intact.   LABORATORY DATA:  X-rays shows severe osteoarthritis, right hip.   IMPRESSION:  Severe osteoarthritis.   PLAN:  Dr. Charlann Boxer to complete a right total hip arthroplasty.  This is  scheduled for June 25, 2004.     Thomas B. Durwin Nora, P.A.                     Madlyn Frankel Charlann Boxer, M.D.    TBD/MEDQ  D:  06/19/2004  T:  06/19/2004  Job:  [id number]

## 2011-02-28 NOTE — Discharge Summary (Signed)
   NAME:  [name], [name]                       ACCOUNT NO.:  [id number]   MEDICAL RECORD NO.:  [id number]                   PATIENT TYPE:  OIB   LOCATION:  2014                                 FACILITY:  [facility]   PHYSICIAN:  Duke Salvia, M.D. [facility]           DATE OF BIRTH:  Apr 18, 1933   DATE OF ADMISSION:  10/27/2002  DATE OF DISCHARGE:                                 DISCHARGE SUMMARY   HISTORY OF PRESENT ILLNESS:  This is a 77 year old gentleman with past  medical history of atrial flutter, MI, hypertension, CAD, status post CABG  three years ago with an EF 68% who was admitted for an atrial flutter  ablation.   HOSPITAL COURSE:  The patient underwent radiofrequency ablation of his  atrial flutter.  He had no postoperative complications and was discharged to  home the following day in stable condition.   DISCHARGE MEDICATIONS:  1. Pravachol 40 mg nightly.  2. Nexium 40 daily.  3. Enteric coated aspirin 81 daily.  4. Lopressor 25 b.i.d.  5. Lotrel 5/20 mg daily.  6. Cardura 8 mg daily.  7. Antibiotics prior to any dental, bowel or urine procedures within the     next three months.  8. He is to take Tylenol one to two tablets every four to six hours as     needed for pain.   ACTIVITY:  No heavy lifting or strenuous activity for four days.  No driving  for two days.   DIET:  Low fat, low salt, low cholesterol diet.   WOUND CARE:  He was to call if he developed any drainage or lump in his  groin.   FOLLOW UP:  He is scheduled for follow-up visit with Dr. Graciela Husbands on November 25, 2002, at 10 a.m.     Chinita Pester, C.R.N.P. [facility]                 Duke Salvia, M.D. [facility]    DS/MEDQ  D:  10/28/2002  T:  10/28/2002  Job:  [id number]   cc:   Julieanne Manson  [address]  [place]  [place] [address]  Fax: [phone]   Willa Rough, M.D. [facility]

## 2011-02-28 NOTE — Op Note (Signed)
NAME:  [name], [name]                       ACCOUNT NO.:  [id number]   MEDICAL RECORD NO.:  [id number]                   PATIENT TYPE:  OIB   LOCATION:  NA                                   FACILITY:  [facility]   PHYSICIAN:  Duke Salvia, M.D. [facility]           DATE OF BIRTH:  1932/12/02   DATE OF PROCEDURE:  10/27/2002  DATE OF DISCHARGE:                                 OPERATIVE REPORT   PREOPERATIVE DIAGNOSIS:  Atrial flutter.   POSTOPERATIVE DIAGNOSIS:  Atrial flutter.   PROCEDURE PERFORMED:  Invasive electrophysiologic study, electroanatomical  mapping with radio frequency catheter ablation.   DESCRIPTION OF PROCEDURE:  Following obtaining informed consent, the patient  was brought to the electrophysiology laboratory and placed on the  fluoroscopic table in the supine position.  The submitted to general  anesthesia under the care of Burna Forts, M.D.  After routine prep  drape, cardiac catheterization was performed.  At the end of the procedure,  the catheters were removed, hemostasis was obtained and the patient was  transferred to the recovery room in stable condition.   Catheters.  A 5 French quadripolar catheter was inserted via the left  femoral vein to the AV junction.  A 7 French duodecapolar catheter was  inserted via the left femoral vein to the tricuspid annulus on the coronary  sinus.  A 7 French 8 mm deflectable tip radio frequency catheter was  inserted via a Swartz ([id number]) active fixation posterior septal space.   Surface leads 1, aVF and V1 were monitored continuously throughout the  procedure.  Following insertion of the catheters, the stimulation protocol  included incremental atrial pacing, incremental ventricular pacing, single  atrial electrical stimuli with a pace cycle length of 600 ms.  Burst of  coronary sinus pacing.   RESULTS:  Surface electrocardiogram.  Final.  Rhythm is sinus initial and  sinus rhythm final.  Cycle length is  1136  msec initial and 1075 msec final.  PR intervals.  206 msec initial and  194 msec final.  QRS duration 79 msec initial and 165 msec final.  QT  interval is 385 msec initial and 403 msec final.  P-wave duration is 135  msec initial and 149 msec final.  Bundle branch block was absent initial and  present-right final.  Preexcitation was absent and absent.   AV nodal function.  AH interval was 96 msec initial and 104 msec final.  AV Wenckebach was 500  msec.  AV nodal effective refractory related pace cycle length of 600 msec  with 360 msec without evidence of AV nodal discontinuity.  VA conduction was  associative at 600 msec.   His Purkinje.  HV interval was 61 msec initial and 57 msec final.  His bundle duration was  8 msec initial and 20 msec final.   Accessory pathway function.  No evidence of an accessory pathway was  identified.   Arrhythmias induced.  Typical atrial flutter was induced via coronary sinus  pacing with a cycle length of 230 msec.  The cycle length was 251 msec.  Mapping demonstrated a counterclockwise rotation.  Attempts to entrain it  were complicated by difficulties in capture and so entrainment mapping was  not undertaken.   Fluoroscopy time.  A total of 10 minutes of fluoroscopy time was utilized at  15 frames per second.   Radio frequency energy.  A total of 8 minutes and 32 seconds of radio wave  energy were applied at mapping sites between the tricuspid annulus and the  inferior vena cava at approximately 6 o'clock on the tricuspid annulus.  A  number of pops occurred and the sheath was then changed to a flutter sheath  which allowed for a much more continuous and linear lesion across the  isthmus.  At a site just caudal to and posterior to the CS os bidirectional  block was generated.  After observation of 25 minutes without recurrence of  conduction across the isthmus, the procedure was terminated.   ESI-electroanatomical mapping was used in conjunction  to identify the course  of our linear applications.  The ESI ray was not utilized.   IMPRESSION:  1. Normal sinus function apart from bradycardia.  2. Abnormal atrial function with sustained atrial flutter and prolonged     interatrial conduction times.  3. Normal AV nodal function with a modest prolongation of AV nodal     Wenckebach cycle length.  4. Normal His Purkinje system function with a new element of intraprocedural     right bundle branch block.  5. No accessory pathway.  6. Normal ventricular response to programmed stimulation as described above.   COMPLICATIONS:  The patient developed transient hypotension that was  unexplained.  The patient had evidence of a CDT through one of the long  sheaths noted to be about 5 cm.  Stat echo was obtained with the assistance  of Dr. Veneda Melter which demonstrated no evidence of pericardial effusion.  At this juncture, the catheters were removed and the procedure was  terminated.   SUMMARY/CONCLUSION:  Results of electrophysiologic testing identified a  substrate for sustained typical atrial flutter.  Radiowave energy applied  across the isthmus successfully interrupted bidirectional conduction across  the isthmus eliminating the substrate for the patient's atrial flutter.   Given that the patient came into the procedure in sinus rhythm, we will  discontinue his Coumadin at this time and anticipate discharge in the  morning.                                               Duke Salvia, M.D. [facility]    SCK/MEDQ  D:  10/27/2002  T:  10/27/2002  Job:  [id number]

## 2011-04-28 ENCOUNTER — Telehealth: Payer: Self-pay | Admitting: Cardiology

## 2011-04-28 MED ORDER — POTASSIUM CHLORIDE CRYS ER 20 MEQ PO TBCR: 20.0000 meq | EXTENDED_RELEASE_TABLET | Freq: Every day | ORAL | Status: DC

## 2011-04-28 NOTE — Telephone Encounter (Signed)
Pt calling back re klor-con [facility] not at [facility] [address], can we resend?

## 2011-04-28 NOTE — Telephone Encounter (Signed)
[facility] to check and see if [facility] is [facility]. [facility] [place] [facility].  Pt took last pill this am.   Faxed RX refill in for Klor-con m20 tablet  [facility] [phone]

## 2011-08-21 ENCOUNTER — Ambulatory Visit: Payer: BC Managed Care – PPO | Admitting: Unknown Physician Specialty

## 2011-08-21 LAB — HM COLONOSCOPY

## 2011-08-26 ENCOUNTER — Encounter: Payer: Self-pay | Admitting: Cardiology

## 2011-08-29 ENCOUNTER — Ambulatory Visit: Payer: Self-pay | Admitting: Cardiology

## 2011-09-23 ENCOUNTER — Ambulatory Visit (INDEPENDENT_AMBULATORY_CARE_PROVIDER_SITE_OTHER): Payer: Medicare Other | Admitting: Cardiology

## 2011-09-23 ENCOUNTER — Encounter: Payer: Self-pay | Admitting: Cardiology

## 2011-09-23 VITALS — BP 115/71 | HR 77 | Ht 68.0 in | Wt 213.0 lb

## 2011-09-23 DIAGNOSIS — E785 Hyperlipidemia, unspecified: Secondary | ICD-10-CM

## 2011-09-23 DIAGNOSIS — I10 Essential (primary) hypertension: Secondary | ICD-10-CM

## 2011-09-23 DIAGNOSIS — I2510 Atherosclerotic heart disease of native coronary artery without angina pectoris: Secondary | ICD-10-CM

## 2011-09-23 NOTE — Assessment & Plan Note (Signed)
Blood pressure controlled. Continue present medications. Potassium and renal function monitored by primary care. 

## 2011-09-23 NOTE — Progress Notes (Signed)
HPI:Mr. Paglia is a very pleasant gentleman who has a history of coronary artery disease status post coronary bypassing graft in May 1997.  He also has a history of atrial flutter ablation. Last Myoview was performed in November of 2009. There was mild apical thinning but no scar or ischemia. Ejection fraction was 60%. Previous abdominal ultrasound in May 2006 showed no significant renal artery stenosis and normal caliber aorta.  I last saw him in Nov 2011. Since then he denies any dyspnea on exertion, orthopnea, PND, pedal edema, palpitations, syncope or exertional chest pain.  Current Outpatient Prescriptions  Medication Sig Dispense Refill  . amLODipine (NORVASC) 10 MG tablet Take 10 mg by mouth daily.        Marland Kitchen aspirin 81 MG tablet Take 81 mg by mouth daily.        Marland Kitchen atorvastatin (LIPITOR) 40 MG tablet Take 40 mg by mouth daily.        Marland Kitchen doxazosin (CARDURA) 2 MG tablet Take 2 mg by mouth at bedtime.        Marland Kitchen labetalol (NORMODYNE) 200 MG tablet Take 200 mg by mouth 2 (two) times daily.        Marland Kitchen latanoprost (XALATAN) 0.005 % ophthalmic solution 1 drop at bedtime.        Marland Kitchen LORazepam (ATIVAN) 2 MG tablet Take 2 mg by mouth every 6 (six) hours as needed.        Marland Kitchen losartan-hydrochlorothiazide (HYZAAR) 100-25 MG per tablet Take 1 tablet by mouth daily.        Marland Kitchen omeprazole (PRILOSEC) 20 MG capsule Take 20 mg by mouth daily.        . potassium chloride SA (K-DUR,KLOR-CON) 20 MEQ tablet Take 1 tablet (20 mEq total) by mouth daily.  30 tablet  10     Past Medical History  Diagnosis Date  . Unspecified essential hypertension   . Coronary atherosclerosis   . Anemia, unspecified   . Anxiety state, unspecified   . Osteoarthritis     group B streptococcal septic arthritis  . GERD (gastroesophageal reflux disease)     No past surgical history on file.  History   Social History  . Marital Status: Divorced    Spouse Name: N/A    Number of Children: N/A  . Years of Education: N/A   Occupational  History  . Not on file.   Social History Main Topics  . Smoking status: Never Smoker   . Smokeless tobacco: Not on file  . Alcohol Use: Not on file  . Drug Use: Not on file  . Sexually Active: Not on file   Other Topics Concern  . Not on file   Social History Narrative  . No narrative on file    ROS: Some numbness in right hand from carpel tunnel but no fevers or chills, productive cough, hemoptysis, dysphasia, odynophagia, melena, hematochezia, dysuria, hematuria, rash, seizure activity, orthopnea, PND, pedal edema, claudication. Remaining systems are negative.  Physical Exam: Well-developed well-nourished in no acute distress.  Skin is warm and dry.  HEENT is normal.  Neck is supple. No thyromegaly.  Chest is clear to auscultation with normal expansion. S/P sternotomy Cardiovascular exam is regular rate and rhythm.  Abdominal exam nontender or distended. No masses palpated. Extremities show no edema. neuro grossly intact  ECG NSR, first degree AV block, RBBB

## 2011-09-23 NOTE — Patient Instructions (Signed)
Your physician wants you to follow-up in: ONE YEAR You will receive a reminder letter in the mail two months in advance. If you don't receive a letter, please call our office to schedule the follow-up appointment.   Your physician has requested that you have en exercise stress myoview. For further information please visit [email or web address]. Please follow instruction sheet, as given.   

## 2011-09-23 NOTE — Assessment & Plan Note (Signed)
Continue statin. Lipids and liver monitored by primary care. 

## 2011-09-23 NOTE — Assessment & Plan Note (Signed)
Continue aspirin and statin. Schedule Myoview for risk stratification. 

## 2011-09-30 ENCOUNTER — Other Ambulatory Visit: Payer: Self-pay | Admitting: *Deleted

## 2011-09-30 MED ORDER — POTASSIUM CHLORIDE CRYS ER 20 MEQ PO TBCR: 20.0000 meq | EXTENDED_RELEASE_TABLET | Freq: Every day | ORAL | Status: DC

## 2011-10-13 ENCOUNTER — Other Ambulatory Visit: Payer: Self-pay | Admitting: Cardiology

## 2011-10-13 MED ORDER — POTASSIUM CHLORIDE CRYS ER 20 MEQ PO TBCR: 20.0000 meq | EXTENDED_RELEASE_TABLET | Freq: Every day | ORAL | Status: DC

## 2011-11-04 ENCOUNTER — Encounter (HOSPITAL_COMMUNITY): Payer: BC Managed Care – PPO | Admitting: Radiology

## 2011-12-01 ENCOUNTER — Ambulatory Visit (HOSPITAL_COMMUNITY): Payer: Medicare Other | Attending: Cardiology | Admitting: Radiology

## 2011-12-01 VITALS — BP 149/80 | HR 74 | Ht 68.0 in | Wt 212.0 lb

## 2011-12-01 DIAGNOSIS — I10 Essential (primary) hypertension: Secondary | ICD-10-CM | POA: Insufficient documentation

## 2011-12-01 DIAGNOSIS — I4949 Other premature depolarization: Secondary | ICD-10-CM

## 2011-12-01 DIAGNOSIS — E785 Hyperlipidemia, unspecified: Secondary | ICD-10-CM | POA: Insufficient documentation

## 2011-12-01 DIAGNOSIS — Z951 Presence of aortocoronary bypass graft: Secondary | ICD-10-CM | POA: Insufficient documentation

## 2011-12-01 DIAGNOSIS — I4510 Unspecified right bundle-branch block: Secondary | ICD-10-CM | POA: Insufficient documentation

## 2011-12-01 DIAGNOSIS — Z8249 Family history of ischemic heart disease and other diseases of the circulatory system: Secondary | ICD-10-CM | POA: Insufficient documentation

## 2011-12-01 DIAGNOSIS — I2510 Atherosclerotic heart disease of native coronary artery without angina pectoris: Secondary | ICD-10-CM | POA: Insufficient documentation

## 2011-12-01 MED ORDER — TECHNETIUM TC 99M TETROFOSMIN IV KIT
30.0000 | PACK | Freq: Once | INTRAVENOUS | Status: AC | PRN
  Administered 2011-12-01: 30 via INTRAVENOUS

## 2011-12-01 MED ORDER — TECHNETIUM TC 99M TETROFOSMIN IV KIT
10.0000 | PACK | Freq: Once | INTRAVENOUS | Status: AC | PRN
  Administered 2011-12-01: 10 via INTRAVENOUS

## 2011-12-01 NOTE — Progress Notes (Signed)
[facility] NUCLEAR MED [address] [place] [address] [phone]  Cardiology Nuclear Med [name] is a 78 y.o. male [id number] December 29, 1932   Nuclear Med Background Indication for Stress Test:  Evaluation for Ischemia and Graft Patency History:  '97 MI>CABG; '04 Ablation; '09 ION:GEXBMW, EF=60% Cardiac Risk Factors: Family History - CAD, Hypertension, Lipids and RBBB  Symptoms:  No cardiac complaints.   Nuclear Pre-Procedure Caffeine/Decaff Intake:  None NPO After: 9:00pm   Lungs:  clear IV 0.9% NS with Angio Cath:  20g  IV Site: R Antecubital  IV Started by:  Stanton Kidney, EMT-P  Chest Size (in):  48 Cup Size: n/a  Height: 5\' 8"  (1.727 m)  Weight:  212 lb (96.163 kg)  BMI:  Body mass index is 32.23 kg/(m^2). Tech Comments:  Labetolol held > 24 hours, per patient.    Nuclear Med Study 1 or 2 day study: 1 day  Stress Test Type:  Stress  Reading MD: Marca Ancona, MD  Order Authorizing Provider:  B. CRENSHAW, MD  Resting Radionuclide: Technetium 90m Tetrofosmin  Resting Radionuclide Dose: 10.7 mCi   Stress Radionuclide:  Technetium 57m Tetrofosmin  Stress Radionuclide Dose: 33.0 mCi           Stress Protocol Rest HR: 74 Stress HR: 144  Rest BP: Sitting:149/80  Standing:162/85 Stress BP: 202/60  Exercise Time (min): 5:30 METS: 7.0   Predicted Max HR: 144 bpm % Max HR: 100 bpm Rate Pressure Product: 41324   Dose of Adenosine (mg):  n/a Dose of Lexiscan: n/a mg  Dose of Atropine (mg): n/a Dose of Dobutamine: n/a mcg/kg/min (at max HR)  Stress Test Technologist: Smiley Houseman, CMA-N  Nuclear Technologist:  Doyne Keel, CNMT     Rest Procedure:  Myocardial perfusion imaging was performed at rest 45 minutes following the intravenous administration of Technetium 26m Tetrofosmin.  Rest ECG: RBBB with occasional PVC's and rare couplets.  Stress Procedure:  The patient exercised for 5:30 on the treadmill utilizing the Bruce  protocol.  The patient stopped due to fatigue and dyspnea.  He denied any chest pain.  There were no diagnostic ST-T wave changes.  There were occasional PAC's and PVC's with couplets.  He had a hypertensive response to exercise, 202/60, Labetalol was held for 24-hours.  Technetium 87m Tetrofosmin was injected at peak exercise and myocardial perfusion imaging was performed after a brief delay.  Stress ECG: ST depression in V2 and V3 of borderline significance.   QPS Raw Data Images:  Normal; no motion artifact; normal heart/lung ratio. Stress Images:  Small apical perfusion defect.  Rest Images:  Small apical perfusion defect.  Subtraction (SDS):  Small apical perfusion defect.  Transient Ischemic Dilatation (Normal <1.22):  0.85 Lung/Heart Ratio (Normal <0.45):  0.28  Quantitative Gated Spect Images QGS EDV:  82 ml QGS ESV:  30 ml QGS cine images:  NL LV Function; NL Wall Motion QGS EF: 64%  Impression Exercise Capacity:  Fair exercise capacity. BP Response:  Hypertensive blood pressure response. Clinical Symptoms:  Exertional dyspnea. ECG Impression:  ST depression in V2 and V3 of borderline significance.  Comparison with Prior Nuclear Study: No significant change from previous study  Overall Impression:  Normal stress nuclear study.  Small fixed apical perfusion defect likely represents apical thinning.   [name] [employer]

## 2011-12-02 ENCOUNTER — Telehealth: Payer: Self-pay | Admitting: Cardiology

## 2011-12-02 NOTE — Telephone Encounter (Signed)
Pt rtn calling re results, can be reached between 4p and 430p

## 2011-12-02 NOTE — Telephone Encounter (Signed)
Fu call °Pt calling back again °

## 2011-12-02 NOTE — Telephone Encounter (Signed)
Spoke with pt, aware of nuclear results 

## 2012-01-14 ENCOUNTER — Ambulatory Visit: Payer: Self-pay | Admitting: Specialist

## 2012-01-14 LAB — HEMOGLOBIN: HGB: 14.7 g/dL (ref 13.0–18.0)

## 2012-01-15 ENCOUNTER — Telehealth: Payer: Self-pay | Admitting: Cardiology

## 2012-01-15 NOTE — Telephone Encounter (Signed)
LOV,Stress,12 lead faxed to [facility] @ [phone] 01/15/12/KM

## 2012-01-20 ENCOUNTER — Ambulatory Visit: Payer: Self-pay | Admitting: Specialist

## 2012-07-12 ENCOUNTER — Emergency Department: Payer: Self-pay | Admitting: Emergency Medicine

## 2012-07-12 LAB — CBC
HCT: 41.8 % (ref 40.0–52.0)
MCH: 32.4 pg (ref 26.0–34.0)
MCHC: 35.1 g/dL (ref 32.0–36.0)
Platelet: 201 10*3/uL (ref 150–440)
RBC: 4.53 10*6/uL (ref 4.40–5.90)
RDW: 14.1 % (ref 11.5–14.5)

## 2012-07-12 LAB — COMPREHENSIVE METABOLIC PANEL
Anion Gap: 10 (ref 7–16)
Calcium, Total: 9.3 mg/dL (ref 8.5–10.1)
Chloride: 107 mmol/L (ref 98–107)
Creatinine: 1.52 mg/dL — ABNORMAL HIGH (ref 0.60–1.30)
EGFR (African American): 50 — ABNORMAL LOW
Glucose: 128 mg/dL — ABNORMAL HIGH (ref 65–99)
Potassium: 3.7 mmol/L (ref 3.5–5.1)
SGOT(AST): 26 U/L (ref 15–37)
SGPT (ALT): 38 U/L (ref 12–78)

## 2012-07-12 LAB — SEDIMENTATION RATE: Erythrocyte Sed Rate: 8 mm/hr (ref 0–20)

## 2012-07-12 LAB — TROPONIN I: Troponin-I: <0.02 ng/mL

## 2012-11-11 ENCOUNTER — Other Ambulatory Visit: Payer: Self-pay

## 2012-11-11 MED ORDER — POTASSIUM CHLORIDE CRYS ER 20 MEQ PO TBCR: 20.0000 meq | EXTENDED_RELEASE_TABLET | Freq: Every day | ORAL | Status: DC

## 2012-11-23 ENCOUNTER — Ambulatory Visit: Payer: Medicare Other | Admitting: Cardiology

## 2013-01-04 ENCOUNTER — Encounter: Payer: Self-pay | Admitting: Cardiology

## 2013-01-04 ENCOUNTER — Ambulatory Visit (INDEPENDENT_AMBULATORY_CARE_PROVIDER_SITE_OTHER): Payer: Medicare Other | Admitting: Cardiology

## 2013-01-04 VITALS — BP 144/75 | HR 70 | Ht 68.0 in | Wt 213.0 lb

## 2013-01-04 DIAGNOSIS — I10 Essential (primary) hypertension: Secondary | ICD-10-CM

## 2013-01-04 DIAGNOSIS — I2510 Atherosclerotic heart disease of native coronary artery without angina pectoris: Secondary | ICD-10-CM

## 2013-01-04 DIAGNOSIS — E785 Hyperlipidemia, unspecified: Secondary | ICD-10-CM

## 2013-01-04 DIAGNOSIS — I499 Cardiac arrhythmia, unspecified: Secondary | ICD-10-CM

## 2013-01-04 NOTE — Assessment & Plan Note (Signed)
Continue statin. Lipids and liver monitored by primary care. 

## 2013-01-04 NOTE — Patient Instructions (Addendum)
Your physician recommends that you continue on your current medications as directed. Please refer to the Current Medication list given to you today.  Your physician wants you to follow-up in: 1 year. You will receive a reminder letter in the mail two months in advance. If you don't receive a letter, please call our office to schedule the follow-up appointment.  

## 2013-01-04 NOTE — Assessment & Plan Note (Signed)
Blood pressure borderline. Continue present medications. He will monitor and we will increase medications as needed.

## 2013-01-04 NOTE — Assessment & Plan Note (Addendum)
Continue aspirin and statin. 

## 2013-01-04 NOTE — Progress Notes (Signed)
   HPI: [name] is a very pleasant gentleman who has a history of coronary artery disease status post coronary bypassing graft in May 1997. He also has a history of atrial flutter ablation. Last Myoview was performed in Feb 2013. There was mild apical thinning but no scar or ischemia. Ejection fraction was 64%. Previous abdominal ultrasound in May 2006 showed no significant renal artery stenosis and normal caliber aorta. I last saw him in Dec 2012. Since then he denies any dyspnea on exertion, orthopnea, PND, pedal edema, palpitations, syncope or exertional chest pain.   Current Outpatient Prescriptions  Medication Sig Dispense Refill  . amLODipine (NORVASC) 10 MG tablet Take 10 mg by mouth daily.        Marland Kitchen aspirin 81 MG tablet Take 81 mg by mouth daily.        Marland Kitchen atorvastatin (LIPITOR) 40 MG tablet Take 40 mg by mouth daily.        Marland Kitchen doxazosin (CARDURA) 2 MG tablet Take 2 mg by mouth at bedtime.        Marland Kitchen labetalol (NORMODYNE) 200 MG tablet Take 200 mg by mouth 2 (two) times daily.        Marland Kitchen latanoprost (XALATAN) 0.005 % ophthalmic solution 1 drop at bedtime.        Marland Kitchen LORazepam (ATIVAN) 2 MG tablet Take 2 mg by mouth every 6 (six) hours as needed.        Marland Kitchen losartan-hydrochlorothiazide (HYZAAR) 100-25 MG per tablet Take 1 tablet by mouth daily.        Marland Kitchen omeprazole (PRILOSEC) 20 MG capsule Take 20 mg by mouth daily.        . potassium chloride SA (K-DUR,KLOR-CON) 20 MEQ tablet Take 1 tablet (20 mEq total) by mouth daily.  90 tablet  3   No current facility-administered medications for this visit.     Past Medical History  Diagnosis Date  . Unspecified essential hypertension   . Coronary atherosclerosis   . Anemia, unspecified   . Anxiety state, unspecified   . Osteoarthritis     group B streptococcal septic arthritis  . GERD (gastroesophageal reflux disease)     History reviewed. No pertinent past surgical history.  History   Social History  . Marital Status: Divorced    Spouse  Name: N/A    Number of Children: N/A  . Years of Education: N/A   Occupational History  . Not on file.   Social History Main Topics  . Smoking status: Never Smoker   . Smokeless tobacco: Not on file  . Alcohol Use: Not on file  . Drug Use: Not on file  . Sexually Active: Not on file   Other Topics Concern  . Not on file   Social History Narrative  . No narrative on file    ROS: occasional back pain but no fevers or chills, productive cough, hemoptysis, dysphasia, odynophagia, melena, hematochezia, dysuria, hematuria, rash, seizure activity, orthopnea, PND, pedal edema, claudication. Remaining systems are negative.  Physical Exam: Well-developed well-nourished in no acute distress.  Skin is warm and dry.  HEENT is normal.  Neck is supple.  Chest is clear to auscultation with normal expansion.  Cardiovascular exam is regular rate and rhythm.  Abdominal exam nontender or distended. No masses palpated. Extremities show no edema. neuro grossly intact  ECG sinus rhythm, left axis deviation, right bundle branch block.

## 2013-02-15 ENCOUNTER — Ambulatory Visit: Payer: Self-pay | Admitting: Family Medicine

## 2013-10-17 ENCOUNTER — Other Ambulatory Visit: Payer: Self-pay

## 2013-10-17 MED ORDER — POTASSIUM CHLORIDE CRYS ER 20 MEQ PO TBCR: 20.0000 meq | EXTENDED_RELEASE_TABLET | Freq: Every day | ORAL | Status: DC

## 2013-11-03 ENCOUNTER — Other Ambulatory Visit: Payer: Self-pay

## 2013-11-03 MED ORDER — POTASSIUM CHLORIDE CRYS ER 20 MEQ PO TBCR: 20.0000 meq | EXTENDED_RELEASE_TABLET | Freq: Every day | ORAL | Status: DC

## 2014-01-05 ENCOUNTER — Ambulatory Visit: Payer: Medicare Other | Admitting: Cardiology

## 2014-01-19 ENCOUNTER — Ambulatory Visit: Payer: Self-pay | Admitting: Unknown Physician Specialty

## 2014-02-07 ENCOUNTER — Ambulatory Visit (INDEPENDENT_AMBULATORY_CARE_PROVIDER_SITE_OTHER): Payer: Commercial Managed Care - HMO | Admitting: Cardiology

## 2014-02-07 ENCOUNTER — Encounter: Payer: Self-pay | Admitting: Cardiology

## 2014-02-07 VITALS — BP 146/87 | HR 71 | Ht 68.0 in | Wt 220.0 lb

## 2014-02-07 DIAGNOSIS — E785 Hyperlipidemia, unspecified: Secondary | ICD-10-CM

## 2014-02-07 DIAGNOSIS — I10 Essential (primary) hypertension: Secondary | ICD-10-CM

## 2014-02-07 DIAGNOSIS — I2510 Atherosclerotic heart disease of native coronary artery without angina pectoris: Secondary | ICD-10-CM

## 2014-02-07 NOTE — Progress Notes (Signed)
      HPI: FU coronary artery disease status post coronary bypassing graft in May 1997. He also has a history of atrial flutter ablation. Last Myoview was performed in Feb 2013. There was mild apical thinning but no scar or ischemia. Ejection fraction was 64%. Previous abdominal ultrasound in May 2006 showed no significant renal artery stenosis and normal caliber aorta. I last saw him in March 2014. Since then he denies any dyspnea on exertion, orthopnea, PND, pedal edema, palpitations, syncope or exertional chest pain.   Current Outpatient Prescriptions  Medication Sig Dispense Refill  . amLODipine (NORVASC) 10 MG tablet Take 10 mg by mouth daily.        Marland Kitchen aspirin 81 MG tablet Take 81 mg by mouth daily.        Marland Kitchen atorvastatin (LIPITOR) 40 MG tablet Take 40 mg by mouth daily.        . dorzolamide (TRUSOPT) 2 % ophthalmic solution       . doxazosin (CARDURA) 2 MG tablet Take 2 mg by mouth at bedtime.        Marland Kitchen labetalol (NORMODYNE) 200 MG tablet Take 200 mg by mouth 2 (two) times daily.        Marland Kitchen latanoprost (XALATAN) 0.005 % ophthalmic solution 1 drop at bedtime.        Marland Kitchen LORazepam (ATIVAN) 2 MG tablet Take 2 mg by mouth every 6 (six) hours as needed.        Marland Kitchen losartan-hydrochlorothiazide (HYZAAR) 100-25 MG per tablet Take 1 tablet by mouth daily.        Marland Kitchen omeprazole (PRILOSEC) 20 MG capsule Take 20 mg by mouth daily.        . potassium chloride SA (K-DUR,KLOR-CON) 20 MEQ tablet Take 1 tablet (20 mEq total) by mouth daily.  90 tablet  3   No current facility-administered medications for this visit.     Past Medical History  Diagnosis Date  . Unspecified essential hypertension   . Coronary atherosclerosis   . Anemia, unspecified   . Anxiety state, unspecified   . Osteoarthritis     group B streptococcal septic arthritis  . GERD (gastroesophageal reflux disease)     History reviewed. No pertinent past surgical history.  History   Social History  . Marital Status: Divorced   Spouse Name: N/A    Number of Children: N/A  . Years of Education: N/A   Occupational History  . Not on file.   Social History Main Topics  . Smoking status: Never Smoker   . Smokeless tobacco: Not on file  . Alcohol Use: Not on file  . Drug Use: Not on file  . Sexual Activity: Not on file   Other Topics Concern  . Not on file   Social History Narrative  . No narrative on file    ROS: no fevers or chills, productive cough, hemoptysis, dysphasia, odynophagia, melena, hematochezia, dysuria, hematuria, rash, seizure activity, orthopnea, PND, pedal edema, claudication. Remaining systems are negative.  Physical Exam: Well-developed well-nourished in no acute distress.  Skin is warm and dry.  HEENT is normal.  Neck is supple.  Chest is clear to auscultation with normal expansion.  Cardiovascular exam is regular rate and rhythm.  Abdominal exam nontender or distended. No masses palpated. Extremities show no edema. neuro grossly intact  ECG Sinus rhythm at a rate of 71. Right bundle branch block. Left anterior fascicular block. First degree AV block.

## 2014-02-07 NOTE — Assessment & Plan Note (Signed)
Continue Present blood pressure medications.

## 2014-02-07 NOTE — Assessment & Plan Note (Signed)
Continue statin. 

## 2014-02-07 NOTE — Patient Instructions (Signed)
Your physician wants you to follow-up in: ONE YEAR WITH DR CRENSHAW You will receive a reminder letter in the mail two months in advance. If you don't receive a letter, please call our office to schedule the follow-up appointment.  

## 2014-02-07 NOTE — Assessment & Plan Note (Signed)
Continue aspirin and statin. 

## 2014-02-23 ENCOUNTER — Telehealth: Payer: Self-pay | Admitting: Cardiology

## 2014-02-23 NOTE — Telephone Encounter (Signed)
New message     Pt has 1 pill left of potassian CL ER 58meg--will take last pill tomorrow.  Mail order pharmacy will send pills this weekend.  Will it be OK to miss a day or two until the presc is mailed?  He does not want to buy 5 pills unless he has to.

## 2014-02-23 NOTE — Telephone Encounter (Signed)
Spoke with pt, he will increase potassium rich foods until his tablets get here.

## 2014-04-11 ENCOUNTER — Ambulatory Visit: Payer: Self-pay | Admitting: Family Medicine

## 2014-11-20 ENCOUNTER — Other Ambulatory Visit: Payer: Self-pay | Admitting: Cardiology

## 2014-11-20 ENCOUNTER — Other Ambulatory Visit: Payer: Self-pay

## 2014-11-20 MED ORDER — POTASSIUM CHLORIDE CRYS ER 20 MEQ PO TBCR: 20.0000 meq | EXTENDED_RELEASE_TABLET | Freq: Every day | ORAL | Status: DC

## 2014-11-20 NOTE — Telephone Encounter (Signed)
Pt need a new prescription for his Potassium #90 and refills. Please send or call to [facility].Pt need this asap.

## 2014-11-30 DIAGNOSIS — H4011X2 Primary open-angle glaucoma, moderate stage: Secondary | ICD-10-CM | POA: Diagnosis not present

## 2014-12-05 DIAGNOSIS — I10 Essential (primary) hypertension: Secondary | ICD-10-CM | POA: Diagnosis not present

## 2014-12-05 DIAGNOSIS — E785 Hyperlipidemia, unspecified: Secondary | ICD-10-CM | POA: Diagnosis not present

## 2014-12-05 DIAGNOSIS — M1990 Unspecified osteoarthritis, unspecified site: Secondary | ICD-10-CM | POA: Diagnosis not present

## 2014-12-05 DIAGNOSIS — I2510 Atherosclerotic heart disease of native coronary artery without angina pectoris: Secondary | ICD-10-CM | POA: Diagnosis not present

## 2014-12-05 DIAGNOSIS — B0229 Other postherpetic nervous system involvement: Secondary | ICD-10-CM | POA: Diagnosis not present

## 2014-12-07 DIAGNOSIS — I10 Essential (primary) hypertension: Secondary | ICD-10-CM | POA: Diagnosis not present

## 2014-12-07 DIAGNOSIS — E785 Hyperlipidemia, unspecified: Secondary | ICD-10-CM | POA: Diagnosis not present

## 2014-12-14 ENCOUNTER — Encounter: Payer: Self-pay | Admitting: Cardiology

## 2015-02-04 NOTE — Op Note (Signed)
PATIENT NAME:  [name], [name] MR#:  [id number] DATE OF BIRTH:  December 13, 1932  DATE OF PROCEDURE:  01/20/2012  PREOPERATIVE DIAGNOSIS: Right carpal tunnel syndrome.   POSTOPERATIVE DIAGNOSIS: Right carpal tunnel syndrome.   PROCEDURE: Right carpal tunnel release.   SURGEON: Lucas Mallow, MD  ANESTHESIA: General.   COMPLICATIONS: None.   TOURNIQUET TIME: 13 minutes.   DESCRIPTION OF PROCEDURE: After adequate induction of general anesthesia, the right upper extremity is thoroughly prepped with alcohol and ChloraPrep and draped in standard sterile fashion. Extremity is wrapped out with the Esmarch bandage and pneumatic tourniquet elevated to 250 mmHg. Under loupe magnification, standard volar carpal tunnel incision is made and the dissection carried down to the transverse retinacular ligament. This is incised in the midportion with the knife. The distal release is performed with the small scissors. The proximal release is performed with the carpal tunnel scissors. There is seen to be moderate compression of the nerve directly beneath the ligament. Careful check is made both proximally and distally to ensure that complete release had been obtained. The wound is thoroughly irrigated multiple times. Skin edges are infiltrated with 0.5% plain Marcaine. Skin is closed with 4-0 nylon. Soft bulky dressing is applied. Tourniquet is released. Patient is returned to recovery room in satisfactory condition having tolerated the procedure quite well.   ____________________________ Lucas Mallow, MD ces:cms D: 01/20/2012 11:53:03 ET T: 01/20/2012 13:08:47 ET JOB#: [id number] cc: Lucas Mallow, MD, <Dictator> Lucas Mallow MD ELECTRONICALLY SIGNED 01/21/2012 17:09

## 2015-02-05 NOTE — Progress Notes (Signed)
HPI: FU coronary artery disease status post coronary bypassing graft in May 1997. He also has a history of atrial flutter ablation. Last Myoview was performed in Feb 2013. There was mild apical thinning but no scar or ischemia. Ejection fraction was 64%. Previous abdominal ultrasound in May 2006 showed no significant renal artery stenosis and normal caliber aorta. Since I last saw him, he denies dyspnea, chest pain, palpitations or syncope.  Current Outpatient Prescriptions  Medication Sig Dispense Refill  . amLODipine (NORVASC) 10 MG tablet Take 10 mg by mouth daily.      Marland Kitchen aspirin 81 MG tablet Take 81 mg by mouth daily.      Marland Kitchen atorvastatin (LIPITOR) 40 MG tablet Take 40 mg by mouth daily.      . dorzolamide (TRUSOPT) 2 % ophthalmic solution     . doxazosin (CARDURA) 2 MG tablet Take 2 mg by mouth at bedtime.      Marland Kitchen ibuprofen (ADVIL,MOTRIN) 200 MG tablet Take 200 mg by mouth daily as needed for moderate pain.    Marland Kitchen labetalol (NORMODYNE) 200 MG tablet Take 200 mg by mouth 2 (two) times daily.      Marland Kitchen latanoprost (XALATAN) 0.005 % ophthalmic solution 1 drop at bedtime.      Marland Kitchen loratadine (CLARITIN) 10 MG tablet Take 10 mg by mouth daily.    Marland Kitchen LORazepam (ATIVAN) 2 MG tablet Take 2 mg by mouth every 6 (six) hours as needed.      Marland Kitchen losartan-hydrochlorothiazide (HYZAAR) 100-25 MG per tablet Take 1 tablet by mouth daily.      . meclizine (ANTIVERT) 25 MG tablet Take 1 tablet by mouth daily as needed for dizziness.     Marland Kitchen omeprazole (PRILOSEC) 20 MG capsule Take 20 mg by mouth daily.      . potassium chloride SA (K-DUR,KLOR-CON) 20 MEQ tablet Take 1 tablet (20 mEq total) by mouth daily. 90 tablet 3   No current facility-administered medications for this visit.     Past Medical History  Diagnosis Date  . Unspecified essential hypertension   . Coronary atherosclerosis   . Anemia, unspecified   . Anxiety state, unspecified   . Osteoarthritis     group B streptococcal septic arthritis  .  GERD (gastroesophageal reflux disease)     History reviewed. No pertinent past surgical history.  History   Social History  . Marital Status: Divorced    Spouse Name: N/A  . Number of Children: N/A  . Years of Education: N/A   Occupational History  . Not on file.   Social History Main Topics  . Smoking status: Never Smoker   . Smokeless tobacco: Not on file  . Alcohol Use: Not on file  . Drug Use: Not on file  . Sexual Activity: Not on file   Other Topics Concern  . Not on file   Social History Narrative    ROS: arthralgias but no fevers or chills, productive cough, hemoptysis, dysphasia, odynophagia, melena, hematochezia, dysuria, hematuria, rash, seizure activity, orthopnea, PND, pedal edema, claudication. Remaining systems are negative.  Physical Exam: Well-developed well-nourished in no acute distress.  Skin is warm and dry.  HEENT is normal.  Neck is supple.  Chest is clear to auscultation with normal expansion.  Cardiovascular exam is regular rate and rhythm.  Abdominal exam nontender or distended. No masses palpated. Extremities show no edema. neuro grossly intact  ECG sinus rhythm at a rate of 76. Right bundle branch block. Left anterior  fascicular block. First degree AV block.

## 2015-02-07 ENCOUNTER — Encounter: Payer: Self-pay | Admitting: Cardiology

## 2015-02-07 ENCOUNTER — Ambulatory Visit (INDEPENDENT_AMBULATORY_CARE_PROVIDER_SITE_OTHER): Payer: Commercial Managed Care - HMO | Admitting: Cardiology

## 2015-02-07 ENCOUNTER — Encounter: Payer: Self-pay | Admitting: *Deleted

## 2015-02-07 VITALS — BP 122/74 | HR 76 | Ht 68.0 in | Wt 217.5 lb

## 2015-02-07 DIAGNOSIS — I2510 Atherosclerotic heart disease of native coronary artery without angina pectoris: Secondary | ICD-10-CM

## 2015-02-07 DIAGNOSIS — I25810 Atherosclerosis of coronary artery bypass graft(s) without angina pectoris: Secondary | ICD-10-CM

## 2015-02-07 DIAGNOSIS — I10 Essential (primary) hypertension: Secondary | ICD-10-CM | POA: Diagnosis not present

## 2015-02-07 NOTE — Patient Instructions (Addendum)
Your physician wants you to follow-up in: [place] will receive a reminder letter in the mail two months in advance. If you don't receive a letter, please call our office to schedule the follow-up appointment.   Your physician has requested that you have en exercise stress myoview. For further information please visit [email or web address]. Please follow instruction sheet, as given.TAKE ALL MEDICINES

## 2015-02-07 NOTE — Assessment & Plan Note (Signed)
Blood pressure controlled. Continue present medications. Potassium and renal function monitored by primary care. 

## 2015-02-07 NOTE — Assessment & Plan Note (Signed)
Continue aspirin and statin. Schedule nuclear study for risk stratification. 

## 2015-02-07 NOTE — Assessment & Plan Note (Signed)
Continue statin. Lipids and liver monitored by primary care. 

## 2015-02-20 DIAGNOSIS — X32XXXA Exposure to sunlight, initial encounter: Secondary | ICD-10-CM | POA: Diagnosis not present

## 2015-02-20 DIAGNOSIS — L821 Other seborrheic keratosis: Secondary | ICD-10-CM | POA: Diagnosis not present

## 2015-02-20 DIAGNOSIS — L570 Actinic keratosis: Secondary | ICD-10-CM | POA: Diagnosis not present

## 2015-02-20 DIAGNOSIS — Z85828 Personal history of other malignant neoplasm of skin: Secondary | ICD-10-CM | POA: Diagnosis not present

## 2015-02-22 ENCOUNTER — Telehealth (HOSPITAL_COMMUNITY): Payer: Self-pay

## 2015-02-22 DIAGNOSIS — M1990 Unspecified osteoarthritis, unspecified site: Secondary | ICD-10-CM | POA: Diagnosis not present

## 2015-02-22 DIAGNOSIS — B0229 Other postherpetic nervous system involvement: Secondary | ICD-10-CM | POA: Diagnosis not present

## 2015-02-22 DIAGNOSIS — I2510 Atherosclerotic heart disease of native coronary artery without angina pectoris: Secondary | ICD-10-CM | POA: Diagnosis not present

## 2015-02-22 DIAGNOSIS — I872 Venous insufficiency (chronic) (peripheral): Secondary | ICD-10-CM | POA: Diagnosis not present

## 2015-02-22 DIAGNOSIS — I10 Essential (primary) hypertension: Secondary | ICD-10-CM | POA: Diagnosis not present

## 2015-02-22 NOTE — Telephone Encounter (Signed)
Encounter complete. 

## 2015-02-23 NOTE — Telephone Encounter (Signed)
Spoke with pt, he got a letter from his insurance company about his stress test on Tuesday. Answered questions as best as I could. He wants to know the cost of the test. Number given to the patient for the billing office.

## 2015-02-23 NOTE — Telephone Encounter (Signed)
New Message    Patient needs a callback in regards to the test that he is going to have on Tues. Please give patient a call.

## 2015-02-27 ENCOUNTER — Ambulatory Visit (HOSPITAL_COMMUNITY)
Admission: RE | Admit: 2015-02-27 | Discharge: 2015-02-27 | Disposition: A | Discharge status: Home / Self Care | Payer: Commercial Managed Care - HMO | Source: Ambulatory Visit | Attending: Cardiovascular Disease | Admitting: Cardiovascular Disease

## 2015-02-27 DIAGNOSIS — I25810 Atherosclerosis of coronary artery bypass graft(s) without angina pectoris: Secondary | ICD-10-CM | POA: Insufficient documentation

## 2015-02-27 LAB — MYOCARDIAL PERFUSION IMAGING
CHL CUP NUCLEAR SSS: 6
CHL CUP STRESS STAGE 1 DBP: 90 mmHg
CHL CUP STRESS STAGE 1 HR: 88 {beats}/min
CHL CUP STRESS STAGE 1 SPEED: 0 mph
CHL CUP STRESS STAGE 2 SPEED: 1 mph
CHL CUP STRESS STAGE 3 SPEED: 1 mph
CHL CUP STRESS STAGE 4 HR: 120 {beats}/min
CHL CUP STRESS STAGE 5 HR: 141 {beats}/min
CHL CUP STRESS STAGE 5 SBP: 212 mmHg
CHL CUP STRESS STAGE 6 HR: 142 {beats}/min
CHL CUP STRESS STAGE 7 SBP: 215 mmHg
CHL CUP STRESS STAGE 7 SPEED: 0 mph
CHL CUP STRESS STAGE 8 DBP: 70 mmHg
CHL CUP STRESS STAGE 8 GRADE: 0 %
CHL RATE OF PERCEIVED EXERTION: 29892
Estimated workload: 7 METS
Exercise duration (min): 6 min
LV sys vol: 40 mL
LVDIAVOL: 102 mL
MPHR: 141 {beats}/min
NUC STRESS EF: 61 %
NUC STRESS TID: 0.98
Peak HR: 142 {beats}/min
Percent HR: 100 %
Percent of predicted max HR: 100 %
Rest HR: 88 {beats}/min
SDS: 5
SRS: 2
Stage 1 Grade: 0 %
Stage 1 SBP: 160 mmHg
Stage 2 Grade: 0 %
Stage 2 HR: 91 {beats}/min
Stage 3 Grade: 0 %
Stage 3 HR: 92 {beats}/min
Stage 4 DBP: 62 mmHg
Stage 4 Grade: 10 %
Stage 4 SBP: 210 mmHg
Stage 4 Speed: 1.7 mph
Stage 5 DBP: 65 mmHg
Stage 5 Grade: 12 %
Stage 5 Speed: 2.5 mph
Stage 6 Grade: 12 %
Stage 6 Speed: 2.5 mph
Stage 7 DBP: 74 mmHg
Stage 7 Grade: 0 %
Stage 7 HR: 129 {beats}/min
Stage 8 HR: 95 {beats}/min
Stage 8 SBP: 168 mmHg
Stage 8 Speed: 0 mph

## 2015-02-27 MED ORDER — TECHNETIUM TC 99M SESTAMIBI GENERIC - CARDIOLITE
32.6000 | Freq: Once | INTRAVENOUS | Status: AC | PRN
  Administered 2015-02-27: 33 via INTRAVENOUS

## 2015-02-27 MED ORDER — TECHNETIUM TC 99M SESTAMIBI GENERIC - CARDIOLITE
9.5000 | Freq: Once | INTRAVENOUS | Status: AC | PRN
  Administered 2015-02-27: 10 via INTRAVENOUS

## 2015-04-12 DIAGNOSIS — H4011X3 Primary open-angle glaucoma, severe stage: Secondary | ICD-10-CM | POA: Diagnosis not present

## 2015-05-16 NOTE — Progress Notes (Signed)
      HPI: FU coronary artery disease status post coronary bypassing graft in May 1997. He also has a history of atrial flutter ablation. Ejection fraction was 64%. Previous abdominal ultrasound in May 2006 showed no significant renal artery stenosis and normal caliber aorta. Nuclear study 5/16 showed inferior ischemia and EF 61. Since I last saw him, the patient denies any dyspnea on exertion, orthopnea, PND, pedal edema, palpitations, syncope or chest pain.   Current Outpatient Prescriptions  Medication Sig Dispense Refill  . amLODipine (NORVASC) 10 MG tablet Take 10 mg by mouth daily.      Marland Kitchen aspirin 81 MG tablet Take 81 mg by mouth daily.      Marland Kitchen atorvastatin (LIPITOR) 40 MG tablet Take 40 mg by mouth daily.      . dorzolamide (TRUSOPT) 2 % ophthalmic solution     . doxazosin (CARDURA) 2 MG tablet Take 2 mg by mouth at bedtime.      Marland Kitchen ibuprofen (ADVIL,MOTRIN) 200 MG tablet Take 200 mg by mouth daily as needed for moderate pain.    Marland Kitchen labetalol (NORMODYNE) 200 MG tablet Take 200 mg by mouth 2 (two) times daily.      Marland Kitchen latanoprost (XALATAN) 0.005 % ophthalmic solution 1 drop at bedtime.      Marland Kitchen loratadine (CLARITIN) 10 MG tablet Take 10 mg by mouth daily.    Marland Kitchen LORazepam (ATIVAN) 2 MG tablet Take 2 mg by mouth every 6 (six) hours as needed.      Marland Kitchen losartan-hydrochlorothiazide (HYZAAR) 100-25 MG per tablet Take 1 tablet by mouth daily.      . meclizine (ANTIVERT) 25 MG tablet Take 1 tablet by mouth daily as needed for dizziness.     Marland Kitchen omeprazole (PRILOSEC) 20 MG capsule Take 20 mg by mouth daily.      . potassium chloride SA (K-DUR,KLOR-CON) 20 MEQ tablet Take 1 tablet (20 mEq total) by mouth daily. 90 tablet 3   No current facility-administered medications for this visit.     Past Medical History  Diagnosis Date  . Unspecified essential hypertension   . Coronary atherosclerosis   . Anemia, unspecified   . Anxiety state, unspecified   . Osteoarthritis     group B streptococcal septic  arthritis  . GERD (gastroesophageal reflux disease)     History reviewed. No pertinent past surgical history.  History   Social History  . Marital Status: Divorced    Spouse Name: N/A  . Number of Children: N/A  . Years of Education: N/A   Occupational History  . Not on file.   Social History Main Topics  . Smoking status: Never Smoker   . Smokeless tobacco: Not on file  . Alcohol Use: Not on file  . Drug Use: Not on file  . Sexual Activity: Not on file   Other Topics Concern  . Not on file   Social History Narrative    ROS: no fevers or chills, productive cough, hemoptysis, dysphasia, odynophagia, melena, hematochezia, dysuria, hematuria, rash, seizure activity, orthopnea, PND, pedal edema, claudication. Remaining systems are negative.  Physical Exam: Well-developed well-nourished in no acute distress.  Skin is warm and dry.  HEENT is normal.  Neck is supple.  Chest is clear to auscultation with normal expansion.  Cardiovascular exam is regular rate and rhythm.  Abdominal exam nontender or distended. No masses palpated. Extremities show no edema. neuro grossly intact

## 2015-05-18 ENCOUNTER — Encounter: Payer: Self-pay | Admitting: Cardiology

## 2015-05-18 ENCOUNTER — Ambulatory Visit (INDEPENDENT_AMBULATORY_CARE_PROVIDER_SITE_OTHER): Payer: Commercial Managed Care - HMO | Admitting: Cardiology

## 2015-05-18 VITALS — BP 142/78 | HR 76 | Ht 68.0 in | Wt 209.9 lb

## 2015-05-18 DIAGNOSIS — I2510 Atherosclerotic heart disease of native coronary artery without angina pectoris: Secondary | ICD-10-CM

## 2015-05-18 DIAGNOSIS — I10 Essential (primary) hypertension: Secondary | ICD-10-CM

## 2015-05-18 NOTE — Assessment & Plan Note (Signed)
Continue aspirin and statin. Patient's nuclear study shows mild inferior ischemia. I do not think it is a high risk study. However he is not having cardiac symptoms. Plan medical therapy. He will contact us if he develops dyspnea or chest pain.

## 2015-05-18 NOTE — Patient Instructions (Signed)
Your physician wants you to follow-up in: ONE YEAR WITH DR CRENSHAW You will receive a reminder letter in the mail two months in advance. If you don't receive a letter, please call our office to schedule the follow-up appointment.  

## 2015-05-18 NOTE — Assessment & Plan Note (Signed)
Continue present blood pressure medications. 

## 2015-05-18 NOTE — Assessment & Plan Note (Signed)
Continue statin. 

## 2015-05-24 ENCOUNTER — Other Ambulatory Visit: Payer: Self-pay | Admitting: Family Medicine

## 2015-05-29 ENCOUNTER — Other Ambulatory Visit: Payer: Self-pay | Admitting: Family Medicine

## 2015-06-11 ENCOUNTER — Encounter: Payer: Self-pay | Admitting: Family Medicine

## 2015-06-11 ENCOUNTER — Ambulatory Visit (INDEPENDENT_AMBULATORY_CARE_PROVIDER_SITE_OTHER): Payer: Commercial Managed Care - HMO | Admitting: Family Medicine

## 2015-06-11 VITALS — BP 132/60 | HR 78 | Temp 98.3°F | Resp 16 | Ht 67.5 in | Wt 216.0 lb

## 2015-06-11 DIAGNOSIS — E785 Hyperlipidemia, unspecified: Secondary | ICD-10-CM | POA: Insufficient documentation

## 2015-06-11 DIAGNOSIS — Z23 Encounter for immunization: Secondary | ICD-10-CM | POA: Diagnosis not present

## 2015-06-11 DIAGNOSIS — N529 Male erectile dysfunction, unspecified: Secondary | ICD-10-CM | POA: Insufficient documentation

## 2015-06-11 DIAGNOSIS — F411 Generalized anxiety disorder: Secondary | ICD-10-CM | POA: Insufficient documentation

## 2015-06-11 DIAGNOSIS — I872 Venous insufficiency (chronic) (peripheral): Secondary | ICD-10-CM | POA: Insufficient documentation

## 2015-06-11 DIAGNOSIS — Z0000 Encounter for general adult medical examination without abnormal findings: Secondary | ICD-10-CM | POA: Diagnosis not present

## 2015-06-11 DIAGNOSIS — K219 Gastro-esophageal reflux disease without esophagitis: Secondary | ICD-10-CM | POA: Insufficient documentation

## 2015-06-11 DIAGNOSIS — Z85828 Personal history of other malignant neoplasm of skin: Secondary | ICD-10-CM | POA: Insufficient documentation

## 2015-06-11 DIAGNOSIS — M1990 Unspecified osteoarthritis, unspecified site: Secondary | ICD-10-CM | POA: Insufficient documentation

## 2015-06-11 DIAGNOSIS — M19049 Primary osteoarthritis, unspecified hand: Secondary | ICD-10-CM | POA: Insufficient documentation

## 2015-06-11 DIAGNOSIS — E669 Obesity, unspecified: Secondary | ICD-10-CM | POA: Insufficient documentation

## 2015-06-11 DIAGNOSIS — I2510 Atherosclerotic heart disease of native coronary artery without angina pectoris: Secondary | ICD-10-CM | POA: Insufficient documentation

## 2015-06-11 NOTE — Progress Notes (Signed)
Patient ID: [name], male   DOB: 1933-08-23, 81 y.o.   MRN: [id number] Patient: Deacon Gadbois Ratchford, Male    DOB: 08-29-1933, 82 y.o.   MRN: [id number] Visit Date: 06/11/2015  Today's Provider: [name], MD   Chief Complaint  Patient presents with  . Annual Exam   Subjective:   Kou Gucciardo Caras is a 81 y.o. male who presents today for his Subsequent Annual Wellness Visit. He feels well. He reports exercising 3-4 times a week. He reports he is sleeping fairly well.  EKG- 08/30/14 Colonoscopy 08/21/11 Diverticulosis, hemorrhoids and otherwise normal  TD-2006, needs Tdap ( pt wanted to discuss with Dr. Rosanna Randy first because it will cost pt 62$ out of pocket today)  Review of Systems  Constitutional: Positive for fatigue.  HENT: Positive for congestion, drooling, hearing loss and sneezing.   Eyes: Positive for itching.  Endocrine: Positive for heat intolerance.  Musculoskeletal: Positive for back pain, arthralgias, neck pain and neck stiffness.  Neurological: Positive for dizziness and weakness.  Psychiatric/Behavioral: Positive for sleep disturbance.  All other systems reviewed and are negative.   Patient Active Problem List   Diagnosis Date Noted  . Arthropathy of hand 06/11/2015  . Arteriosclerosis of coronary artery 06/11/2015  . Impotence of organic origin 06/11/2015  . Generalized anxiety disorder 06/11/2015  . Acid reflux 06/11/2015  . History of other malignant neoplasm of skin 06/11/2015  . HLD (hyperlipidemia) 06/11/2015  . Arthritis, degenerative 06/11/2015  . Adiposity 06/11/2015  . Venous insufficiency of leg 06/11/2015  . HYPERLIPIDEMIA 08/16/2009  . ANEMIA 08/16/2009  . ANXIETY 08/16/2009  . GERD 08/16/2009  . OSTEOARTHRITIS 08/16/2009  . Umbilical hernia without obstruction and without gangrene 03/28/2008  . Essential hypertension 10/20/2006  . Coronary atherosclerosis 10/20/2006  . CARDIAC ARRHYTHMIA 10/20/2006  . SEPTIC ARTHRITIS 10/20/2006    Social  History   Social History  . Marital Status: Divorced    Spouse Name: N/A  . Number of Children: N/A  . Years of Education: N/A   Occupational History  . Not on file.   Social History Main Topics  . Smoking status: Never Smoker   . Smokeless tobacco: Not on file  . Alcohol Use: Yes     Comment: drinks a beer occasionally  . Drug Use: No  . Sexual Activity: Not on file   Other Topics Concern  . Not on file   Social History Narrative    History reviewed. No pertinent past surgical history.  His family history includes Arthritis in his brother; Cancer in his father; Dementia in his mother; Depression in his sister; Fibromyalgia in his sister; Heart disease in his mother; Parkinson's disease in his mother; Sleep apnea in his brother.    Outpatient Prescriptions Prior to Visit  Medication Sig Dispense Refill  . amLODipine (NORVASC) 10 MG tablet TAKE 1 TABLET EVERY EVENING 90 tablet 3  . aspirin 81 MG tablet Take 81 mg by mouth daily.      Marland Kitchen atorvastatin (LIPITOR) 40 MG tablet TAKE 1 TABLET EVERY DAY 90 tablet 3  . dorzolamide (TRUSOPT) 2 % ophthalmic solution     . doxazosin (CARDURA) 2 MG tablet TAKE 1 TABLET EVERY DAY 90 tablet 3  . ibuprofen (ADVIL,MOTRIN) 200 MG tablet Take 200 mg by mouth daily as needed for moderate pain.    Marland Kitchen labetalol (NORMODYNE) 200 MG tablet TAKE 1 TABLET TWICE DAILY 180 tablet 3  . latanoprost (XALATAN) 0.005 % ophthalmic solution 1 drop at bedtime.      Marland Kitchen  LORazepam (ATIVAN) 2 MG tablet Take 2 mg by mouth every 6 (six) hours as needed.      Marland Kitchen losartan-hydrochlorothiazide (HYZAAR) 100-25 MG per tablet Take 1 tablet by mouth daily.      . meclizine (ANTIVERT) 25 MG tablet Take 1 tablet by mouth daily as needed for dizziness.     Marland Kitchen omeprazole (PRILOSEC) 20 MG capsule Take 20 mg by mouth daily.      . potassium chloride SA (K-DUR,KLOR-CON) 20 MEQ tablet Take 1 tablet (20 mEq total) by mouth daily. 90 tablet 3  . loratadine (CLARITIN) 10 MG tablet Take 10  mg by mouth daily.     No facility-administered medications prior to visit.    No Known Allergies  Patient Care Team: Jerrol Banana., MD as PCP - General (Unknown Physician Specialty) Lelon Perla, MD (Cardiology)  Objective:   Vitals:  Filed Vitals:   06/11/15 1413  BP: 132/60  Pulse: 78  Temp: 98.3 F (36.8 C)  TempSrc: Oral  Resp: 16  Height: 5' 7.5" (1.715 m)  Weight: 216 lb (97.977 kg)    Physical Exam  Constitutional: He is oriented to person, place, and time. He appears well-developed.  HENT:  Head: Normocephalic and atraumatic.  Right Ear: External ear normal.  Left Ear: External ear normal.  Nose: Nose normal.  Mouth/Throat: Oropharynx is clear and moist.  Eyes: Conjunctivae are normal. Pupils are equal, round, and reactive to light.  Neck: Neck supple.  Cardiovascular: Normal rate, regular rhythm, normal heart sounds and intact distal pulses.   Pulmonary/Chest: Effort normal and breath sounds normal.  Abdominal: Soft. Bowel sounds are normal.  Genitourinary: Rectum normal, prostate normal and penis normal. Guaiac negative stool.  Neurological: He is alert and oriented to person, place, and time.  Skin: Skin is warm and dry.  Multiple seborrheic keratosis on trunk  Psychiatric: He has a normal mood and affect. His behavior is normal. Judgment and thought content normal.    Activities of Daily Living In your present state of health, do you have any difficulty performing the following activities: 06/11/2015  Hearing? Y  Vision? N  Difficulty concentrating or making decisions? N  Walking or climbing stairs? N  Dressing or bathing? N  Doing errands, shopping? N    Fall Risk Assessment Fall Risk  06/11/2015  Falls in the past year? No     Depression Screen PHQ 2/9 Scores 06/11/2015  PHQ - 2 Score 0    Cognitive Testing - 6-CIT    Year: 0 points  Month: 0 points  Memorize "Pia Mau, [address] [place]"  Time (within 1 hour:) 0  points  Count backwards from 20: 0 points  Name months of year: 0 points  Repeat Address: 4 points   Total Score: 4/28  Interpretation : Normal (0-7) Abnormal (8-28)    Assessment & Plan:     Annual Wellness Visit  Reviewed patient's Family Medical History Reviewed and updated list of patient's medical providers Assessment of cognitive impairment was done Assessed patient's functional ability Established a written schedule for health screening [facility] Completed and Reviewed  Exercise Activities and Dietary recommendations Goals    None      Immunization History  Administered Date(s) Administered  . Influenza-Unspecified 07/13/2013    Health Maintenance  Topic Date Due  . TETANUS/TDAP  03/20/1954  . COLONOSCOPY  03/20/1985  . ZOSTAVAX  03/21/1995  . PNA vac Low Risk Adult (1 of 2 - PCV13)  03/20/2000  . INFLUENZA VACCINE  05/14/2015      Discussed health benefits of physical activity, and encouraged him to engage in regular exercise appropriate for his age and condition.  CAD Followed by Dr. Stanford Breed .patient had normal ETT in April 2016  [facility] 06/11/2015 2:20 PM  ------------------------------------------------------------------------------------------------------------

## 2015-06-12 DIAGNOSIS — Z23 Encounter for immunization: Secondary | ICD-10-CM | POA: Diagnosis not present

## 2015-06-12 DIAGNOSIS — Z0000 Encounter for general adult medical examination without abnormal findings: Secondary | ICD-10-CM | POA: Diagnosis not present

## 2015-07-18 ENCOUNTER — Other Ambulatory Visit: Payer: Self-pay | Admitting: Family Medicine

## 2015-07-31 DIAGNOSIS — H524 Presbyopia: Secondary | ICD-10-CM | POA: Diagnosis not present

## 2015-07-31 DIAGNOSIS — H5210 Myopia, unspecified eye: Secondary | ICD-10-CM | POA: Diagnosis not present

## 2015-07-31 DIAGNOSIS — H401132 Primary open-angle glaucoma, bilateral, moderate stage: Secondary | ICD-10-CM | POA: Diagnosis not present

## 2015-08-08 DIAGNOSIS — C44629 Squamous cell carcinoma of skin of left upper limb, including shoulder: Secondary | ICD-10-CM | POA: Diagnosis not present

## 2015-08-08 DIAGNOSIS — Q828 Other specified congenital malformations of skin: Secondary | ICD-10-CM | POA: Diagnosis not present

## 2015-08-08 DIAGNOSIS — L570 Actinic keratosis: Secondary | ICD-10-CM | POA: Diagnosis not present

## 2015-08-08 DIAGNOSIS — L821 Other seborrheic keratosis: Secondary | ICD-10-CM | POA: Diagnosis not present

## 2015-08-08 DIAGNOSIS — D485 Neoplasm of uncertain behavior of skin: Secondary | ICD-10-CM | POA: Diagnosis not present

## 2015-08-08 DIAGNOSIS — Z85828 Personal history of other malignant neoplasm of skin: Secondary | ICD-10-CM | POA: Diagnosis not present

## 2015-08-09 ENCOUNTER — Other Ambulatory Visit: Payer: Self-pay | Admitting: Family Medicine

## 2015-08-09 ENCOUNTER — Ambulatory Visit (INDEPENDENT_AMBULATORY_CARE_PROVIDER_SITE_OTHER): Payer: Commercial Managed Care - HMO

## 2015-08-09 DIAGNOSIS — Z23 Encounter for immunization: Secondary | ICD-10-CM | POA: Diagnosis not present

## 2015-08-10 NOTE — Telephone Encounter (Signed)
Dr.Gilbert's patient.

## 2015-08-10 NOTE — Telephone Encounter (Signed)
Please call in lorazepam.  

## 2015-08-10 NOTE — Telephone Encounter (Signed)
Prescription called into pharmacy ( [facility]).

## 2015-08-22 ENCOUNTER — Other Ambulatory Visit: Payer: Self-pay | Admitting: Cardiology

## 2015-09-04 DIAGNOSIS — C44629 Squamous cell carcinoma of skin of left upper limb, including shoulder: Secondary | ICD-10-CM | POA: Diagnosis not present

## 2015-09-04 DIAGNOSIS — L821 Other seborrheic keratosis: Secondary | ICD-10-CM | POA: Diagnosis not present

## 2015-10-22 ENCOUNTER — Other Ambulatory Visit: Payer: Self-pay | Admitting: Family Medicine

## 2015-11-05 ENCOUNTER — Ambulatory Visit (INDEPENDENT_AMBULATORY_CARE_PROVIDER_SITE_OTHER): Payer: Commercial Managed Care - HMO | Admitting: Family Medicine

## 2015-11-05 ENCOUNTER — Telehealth: Payer: Self-pay

## 2015-11-05 VITALS — BP 100/64 | HR 76 | Temp 97.5°F | Resp 14 | Wt 220.0 lb

## 2015-11-05 DIAGNOSIS — K219 Gastro-esophageal reflux disease without esophagitis: Secondary | ICD-10-CM | POA: Diagnosis not present

## 2015-11-05 MED ORDER — RANITIDINE HCL 150 MG PO TABS: 150.0000 mg | ORAL_TABLET | Freq: Two times a day (BID) | ORAL | Status: DC

## 2015-11-05 MED ORDER — RANITIDINE HCL 150 MG PO TABS: 150.0000 mg | ORAL_TABLET | Freq: Every day | ORAL | Status: DC

## 2015-11-05 NOTE — Telephone Encounter (Signed)
Pt was prescribed Zantac today in the office.  The directions say to take one tablet two times a day but was only dispensed 30 to the local pharmacy and 90 to mail order.  He does remember you saying to take it only at night.  Please advised.   Thanks,   -Mickel Baas

## 2015-11-05 NOTE — Telephone Encounter (Signed)
Zantac Should be just at night. He is taking the Prilosec in the morning.

## 2015-11-05 NOTE — Progress Notes (Signed)
Patient ID: [name], male   DOB: [birth year], 82 y.o.   MRN: [id number]   [name]  MRN: [id number] DOB: 04-09-1933  Subjective:  HPI   1. Gastroesophageal reflux disease, esophagitis presence not specified The patient is an 82 year old male who presents for worsening of his acid reflux.  He is currently on Omeprazole but feels he needs to take 2 a day.  He had cardiac work up in the fall with stress test and said that went well.  Patient states his symptoms are mostly at night and wakes him.  He takes 2 TUMS for relief.  Patient Active Problem List   Diagnosis Date Noted  . Arthropathy of hand 06/11/2015  . Arteriosclerosis of coronary artery 06/11/2015  . Impotence of organic origin 06/11/2015  . Generalized anxiety disorder 06/11/2015  . Acid reflux 06/11/2015  . History of other malignant neoplasm of skin 06/11/2015  . HLD (hyperlipidemia) 06/11/2015  . Arthritis, degenerative 06/11/2015  . Adiposity 06/11/2015  . Venous insufficiency of leg 06/11/2015  . HYPERLIPIDEMIA 08/16/2009  . ANEMIA 08/16/2009  . ANXIETY 08/16/2009  . GERD 08/16/2009  . OSTEOARTHRITIS 08/16/2009  . Umbilical hernia without obstruction and without gangrene 03/28/2008  . Essential hypertension 10/20/2006  . Coronary atherosclerosis 10/20/2006  . CARDIAC ARRHYTHMIA 10/20/2006  . SEPTIC ARTHRITIS 10/20/2006    Past Medical History  Diagnosis Date  . Unspecified essential hypertension   . Coronary atherosclerosis   . Anemia, unspecified   . Anxiety state, unspecified   . Osteoarthritis     group B streptococcal septic arthritis  . GERD (gastroesophageal reflux disease)     Social History   Social History  . Marital Status: Divorced    Spouse Name: N/A  . Number of Children: N/A  . Years of Education: N/A   Occupational History  . Not on file.   Social History Main Topics  . Smoking status: Never Smoker   . Smokeless tobacco: Not on file  . Alcohol Use: Yes     Comment: drinks a  beer occasionally  . Drug Use: No  . Sexual Activity: Not on file   Other Topics Concern  . Not on file   Social History Narrative    Outpatient Prescriptions Prior to Visit  Medication Sig Dispense Refill  . amLODipine (NORVASC) 10 MG tablet TAKE 1 TABLET EVERY EVENING 90 tablet 3  . aspirin 81 MG tablet Take 81 mg by mouth daily.      Marland Kitchen atorvastatin (LIPITOR) 40 MG tablet TAKE 1 TABLET EVERY DAY 90 tablet 3  . dorzolamide (TRUSOPT) 2 % ophthalmic solution     . doxazosin (CARDURA) 2 MG tablet TAKE 1 TABLET EVERY DAY 90 tablet 3  . ibuprofen (ADVIL,MOTRIN) 200 MG tablet Take 200 mg by mouth daily as needed for moderate pain.    Marland Kitchen labetalol (NORMODYNE) 200 MG tablet TAKE 1 TABLET TWICE DAILY 180 tablet 3  . latanoprost (XALATAN) 0.005 % ophthalmic solution 1 drop at bedtime.      Marland Kitchen LORazepam (ATIVAN) 2 MG tablet TAKE 1 TABLET AT BEDTIME AS NEEDED  FOR  SLEEP 90 tablet 0  . losartan-hydrochlorothiazide (HYZAAR) 100-25 MG tablet TAKE 1 TABLET EVERY DAY 90 tablet 3  . meclizine (ANTIVERT) 25 MG tablet Take 1 tablet by mouth daily as needed for dizziness.     Marland Kitchen omeprazole (PRILOSEC) 20 MG capsule TAKE 1 CAPSULE EVERY DAY 90 capsule 3  . potassium chloride SA (K-DUR,KLOR-CON) 20 MEQ tablet TAKE 1  TABLET EVERY DAY 90 tablet 2   No facility-administered medications prior to visit.    No Known Allergies  Review of Systems  Constitutional: Negative for fever, chills and malaise/fatigue.  Respiratory: Positive for cough. Negative for hemoptysis, sputum production, shortness of breath and wheezing.   Cardiovascular: Negative for chest pain, palpitations, orthopnea and leg swelling.  Gastrointestinal: Positive for heartburn. Negative for nausea, vomiting, abdominal pain, diarrhea, constipation, blood in stool and melena.  Neurological: Negative for dizziness and weakness.   Objective:  BP 100/64 mmHg  Pulse 76  Temp(Src) 97.5 F (36.4 C) (Oral)  Resp 14  Wt 220 lb (99.791  kg)  Physical Exam  Assessment and Plan :   1. Gastroesophageal reflux disease, esophagitis presence not specified Add antihistamine/Zantac. If that does not work we'll consider different treatment or further workup. - ranitidine (ZANTAC) 150 MG tablet; Take 1 tablet (150 mg total) by mouth 2 (two) times daily.  Dispense: 90 tablet; Refill: 3 - ranitidine (ZANTAC) 150 MG tablet; Take 1 tablet (150 mg total) by mouth 2 (two) times daily.  Dispense: 30 tablet; Refill: 0 2. CAD All risk factors treated Patient had routine follow up in 3 days, will cancel that appointment and see him back in 2-3 months.   Miguel Aschoff MD [facility] 11/05/2015 10:07 AM

## 2015-11-05 NOTE — Telephone Encounter (Signed)
Pt advised; I fixed the rx to read one in the evening.   Thanks,   -Mickel Baas

## 2015-11-08 ENCOUNTER — Ambulatory Visit: Payer: Commercial Managed Care - HMO | Admitting: Family Medicine

## 2015-11-27 DIAGNOSIS — H401132 Primary open-angle glaucoma, bilateral, moderate stage: Secondary | ICD-10-CM | POA: Diagnosis not present

## 2015-12-05 ENCOUNTER — Other Ambulatory Visit: Payer: Self-pay | Admitting: Family Medicine

## 2015-12-06 NOTE — Telephone Encounter (Signed)
This was sent to Dr. Caryn Section.  It will need your authroization.  Thanks ED

## 2015-12-11 DIAGNOSIS — D485 Neoplasm of uncertain behavior of skin: Secondary | ICD-10-CM | POA: Diagnosis not present

## 2015-12-11 DIAGNOSIS — Z08 Encounter for follow-up examination after completed treatment for malignant neoplasm: Secondary | ICD-10-CM | POA: Diagnosis not present

## 2015-12-11 DIAGNOSIS — L570 Actinic keratosis: Secondary | ICD-10-CM | POA: Diagnosis not present

## 2015-12-11 DIAGNOSIS — C44629 Squamous cell carcinoma of skin of left upper limb, including shoulder: Secondary | ICD-10-CM | POA: Diagnosis not present

## 2015-12-11 DIAGNOSIS — X32XXXA Exposure to sunlight, initial encounter: Secondary | ICD-10-CM | POA: Diagnosis not present

## 2015-12-11 DIAGNOSIS — Z85828 Personal history of other malignant neoplasm of skin: Secondary | ICD-10-CM | POA: Diagnosis not present

## 2016-02-04 ENCOUNTER — Ambulatory Visit: Payer: Commercial Managed Care - HMO | Admitting: Family Medicine

## 2016-02-05 DIAGNOSIS — D2262 Melanocytic nevi of left upper limb, including shoulder: Secondary | ICD-10-CM | POA: Diagnosis not present

## 2016-02-05 DIAGNOSIS — L570 Actinic keratosis: Secondary | ICD-10-CM | POA: Diagnosis not present

## 2016-02-05 DIAGNOSIS — L821 Other seborrheic keratosis: Secondary | ICD-10-CM | POA: Diagnosis not present

## 2016-02-05 DIAGNOSIS — C44629 Squamous cell carcinoma of skin of left upper limb, including shoulder: Secondary | ICD-10-CM | POA: Diagnosis not present

## 2016-02-25 ENCOUNTER — Other Ambulatory Visit: Payer: Self-pay | Admitting: Family Medicine

## 2016-03-24 ENCOUNTER — Other Ambulatory Visit: Payer: Self-pay | Admitting: Cardiology

## 2016-03-27 DIAGNOSIS — H40153 Residual stage of open-angle glaucoma, bilateral: Secondary | ICD-10-CM | POA: Diagnosis not present

## 2016-04-02 ENCOUNTER — Other Ambulatory Visit: Payer: Self-pay | Admitting: Family Medicine

## 2016-04-03 NOTE — Telephone Encounter (Signed)
LOV 11/05/2015

## 2016-04-29 ENCOUNTER — Ambulatory Visit (INDEPENDENT_AMBULATORY_CARE_PROVIDER_SITE_OTHER): Payer: Commercial Managed Care - HMO | Admitting: Family Medicine

## 2016-04-29 VITALS — BP 104/62 | HR 80 | Temp 97.8°F | Resp 14 | Wt 219.0 lb

## 2016-04-29 DIAGNOSIS — L821 Other seborrheic keratosis: Secondary | ICD-10-CM | POA: Diagnosis not present

## 2016-04-29 MED ORDER — LORATADINE 10 MG PO TABS: 10.0000 mg | ORAL_TABLET | Freq: Every day | ORAL | Status: AC

## 2016-04-29 NOTE — Progress Notes (Signed)
Subjective:  HPI  Patient states that he has had skin sensitivity on his forehead for a few months now. Forehead gets itchy and breaks out in red bumps at times. He mentioned it to Dr. Evorn Gong before but he did not seem concerned with it.  Also wanted to get his lump under left breast re checked. He addressed this before years ago but wanted to have it looked at again. Lump does not hurt and he is not sure if the area got bigger since then.  Prior to Admission medications   Medication Sig Start Date End Date Taking? Authorizing Provider  amLODipine (NORVASC) 10 MG tablet TAKE 1 TABLET EVERY EVENING 02/26/16  Yes Jerrol Banana., MD  aspirin 81 MG tablet Take 81 mg by mouth daily.     Yes Historical Provider, MD  atorvastatin (LIPITOR) 40 MG tablet TAKE 1 TABLET EVERY DAY 02/26/16  Yes Jerrol Banana., MD  dorzolamide (TRUSOPT) 2 % ophthalmic solution  12/21/13  Yes Historical Provider, MD  doxazosin (CARDURA) 2 MG tablet TAKE 1 TABLET EVERY DAY 02/26/16  Yes Jerrol Banana., MD  ibuprofen (ADVIL,MOTRIN) 200 MG tablet Take 200 mg by mouth daily as needed for moderate pain.   Yes Historical Provider, MD  labetalol (NORMODYNE) 200 MG tablet TAKE 1 TABLET TWICE DAILY 02/26/16  Yes Richard Maceo Pro., MD  latanoprost (XALATAN) 0.005 % ophthalmic solution 1 drop at bedtime.     Yes Historical Provider, MD  LORazepam (ATIVAN) 2 MG tablet TAKE 1 TABLET AT BEDTIME AS NEEDED FOR SLEEP 12/06/15  Yes Jerrol Banana., MD  losartan-hydrochlorothiazide [facility]) 100-25 MG tablet TAKE 1 TABLET EVERY DAY 04/04/16  Yes Jerrol Banana., MD  meclizine (ANTIVERT) 25 MG tablet Take 1 tablet by mouth daily as needed for dizziness.  11/22/14  Yes Historical Provider, MD  omeprazole (PRILOSEC) 20 MG capsule TAKE 1 CAPSULE EVERY DAY 10/22/15  Yes Richard Maceo Pro., MD  potassium chloride SA (K-DUR,KLOR-CON) 20 MEQ tablet TAKE 1 TABLET EVERY DAY 03/25/16  Yes Lelon Perla, MD  ranitidine  (ZANTAC) 150 MG tablet Take 1 tablet (150 mg total) by mouth 2 (two) times daily. 11/05/15  Yes Richard Maceo Pro., MD  ranitidine (ZANTAC) 150 MG tablet Take 1 tablet (150 mg total) by mouth at bedtime. 11/05/15  Yes Richard Maceo Pro., MD    Patient Active Problem List   Diagnosis Date Noted  . Arthropathy of hand 06/11/2015  . Arteriosclerosis of coronary artery 06/11/2015  . Impotence of organic origin 06/11/2015  . Generalized anxiety disorder 06/11/2015  . Acid reflux 06/11/2015  . History of other malignant neoplasm of skin 06/11/2015  . HLD (hyperlipidemia) 06/11/2015  . Arthritis, degenerative 06/11/2015  . Adiposity 06/11/2015  . Venous insufficiency of leg 06/11/2015  . HYPERLIPIDEMIA 08/16/2009  . ANEMIA 08/16/2009  . ANXIETY 08/16/2009  . GERD 08/16/2009  . OSTEOARTHRITIS 08/16/2009  . Umbilical hernia without obstruction and without gangrene 03/28/2008  . Essential hypertension 10/20/2006  . Coronary atherosclerosis 10/20/2006  . CARDIAC ARRHYTHMIA 10/20/2006  . SEPTIC ARTHRITIS 10/20/2006    Past Medical History  Diagnosis Date  . Unspecified essential hypertension   . Coronary atherosclerosis   . Anemia, unspecified   . Anxiety state, unspecified   . Osteoarthritis     group B streptococcal septic arthritis  . GERD (gastroesophageal reflux disease)     Social History   Social History  . Marital Status: Divorced  Spouse Name: N/A  . Number of Children: N/A  . Years of Education: N/A   Occupational History  . Not on file.   Social History Main Topics  . Smoking status: Never Smoker   . Smokeless tobacco: Not on file  . Alcohol Use: Yes     Comment: drinks a beer occasionally  . Drug Use: No  . Sexual Activity: Not on file   Other Topics Concern  . Not on file   Social History Narrative    No Known Allergies  Review of Systems  Constitutional: Negative.   HENT: Negative.   Eyes: Negative.   Respiratory: Negative.     Cardiovascular: Negative.   Gastrointestinal: Negative.   Musculoskeletal: Negative.   Skin: Positive for itching and rash.  Neurological: Negative.   Endo/Heme/Allergies: Negative.   Psychiatric/Behavioral: Negative.     Immunization History  Administered Date(s) Administered  . Influenza, High Dose Seasonal PF 08/09/2015  . Influenza-Unspecified 07/13/2013  . Pneumococcal Conjugate-13 04/11/2014  . Pneumococcal Polysaccharide-23 08/19/2006  . Td 05/01/2005  . Tdap 06/12/2015  . Zoster 02/05/2011   Objective:  BP 104/62 mmHg  Pulse 80  Temp(Src) 97.8 F (36.6 C)  Resp 14  Wt 219 lb (99.338 kg)  Physical Exam  Constitutional: He is oriented to person, place, and time and well-developed, well-nourished, and in no distress.  HENT:  Head: Normocephalic and atraumatic.  Right Ear: External ear normal.  Left Ear: External ear normal.  Eyes: Conjunctivae are normal. Pupils are equal, round, and reactive to light.  Neck: Normal range of motion. Neck supple.  Cardiovascular: Normal rate, regular rhythm, normal heart sounds and intact distal pulses.   No murmur heard. Pulmonary/Chest: Effort normal and breath sounds normal. No respiratory distress. He has no wheezes.  Abdominal: Soft. He exhibits no distension and no mass. There is no tenderness.  Left lower anterior ribs more protruding then right side, assymetrical.  Musculoskeletal: He exhibits no edema or tenderness.  Lower left anterior ribs protrude a little bit. No tenderness or no abnormal-appearing growth. Mild asymmetry compared to right side.  Neurological: He is alert and oriented to person, place, and time. Gait normal.  Skin: Skin is warm and dry.  SK and some mild erythema of the forehead.    Lab Results  Component Value Date   WBC 6.3 07/12/2012   HGB 14.7 07/12/2012   HCT 41.8 07/12/2012   PLT 201 07/12/2012   GLUCOSE 128* 07/12/2012    CMP     Component Value Date/Time   NA 143 07/12/2012 1600    K 3.7 07/12/2012 1600   CL 107 07/12/2012 1600   CO2 26 07/12/2012 1600   GLUCOSE 128* 07/12/2012 1600   BUN 22* 07/12/2012 1600   CREATININE 1.52* 07/12/2012 1600   CALCIUM 9.3 07/12/2012 1600   PROT 7.2 07/12/2012 1600   ALBUMIN 3.9 07/12/2012 1600   AST 26 07/12/2012 1600   ALT 38 07/12/2012 1600   ALKPHOS 116 07/12/2012 1600   BILITOT 0.3 07/12/2012 1600   GFRNONAA 44* 07/12/2012 1600   GFRAA 50* 07/12/2012 1600    Assessment and Plan :   1. SK of the forehead Advised to use Johnson shampoo once daily and wash his forehead. And try Loratadine. Follow as needed. Go ahead and refer to dermatology. 2. Asymmetrical ribs Advised patient that this does not seem to be worrisome at this time, this has been present for 4 years per patient. Follow as needed. Do not need Chest xray  in my opinion at this time and patient agrees. 3. CAD All risk factors treated. I have done the exam and reviewed the above chart and it is accurate to the best of my knowledge.  Patient was seen and examined by Dr. Eulas Post and note was scribed by Theressa Millard, RMA.  Miguel Aschoff MD [facility] 04/29/2016 10:07 AM

## 2016-05-06 DIAGNOSIS — D0462 Carcinoma in situ of skin of left upper limb, including shoulder: Secondary | ICD-10-CM | POA: Diagnosis not present

## 2016-05-06 DIAGNOSIS — D485 Neoplasm of uncertain behavior of skin: Secondary | ICD-10-CM | POA: Diagnosis not present

## 2016-05-06 DIAGNOSIS — Z85828 Personal history of other malignant neoplasm of skin: Secondary | ICD-10-CM | POA: Diagnosis not present

## 2016-05-06 DIAGNOSIS — L570 Actinic keratosis: Secondary | ICD-10-CM | POA: Diagnosis not present

## 2016-05-06 DIAGNOSIS — Z08 Encounter for follow-up examination after completed treatment for malignant neoplasm: Secondary | ICD-10-CM | POA: Diagnosis not present

## 2016-05-06 DIAGNOSIS — X32XXXA Exposure to sunlight, initial encounter: Secondary | ICD-10-CM | POA: Diagnosis not present

## 2016-05-06 DIAGNOSIS — L821 Other seborrheic keratosis: Secondary | ICD-10-CM | POA: Diagnosis not present

## 2016-05-14 ENCOUNTER — Telehealth: Payer: Self-pay | Admitting: Family Medicine

## 2016-05-14 NOTE — Telephone Encounter (Signed)
Error

## 2016-05-28 ENCOUNTER — Other Ambulatory Visit: Payer: Self-pay | Admitting: Cardiology

## 2016-05-28 NOTE — Telephone Encounter (Signed)
Medication Detail    Disp Refills Start End   potassium chloride SA (K-DUR,KLOR-CON) 20 MEQ tablet 90 tablet 0 03/25/2016    Sig: TAKE 1 TABLET EVERY DAY   E-Prescribing Status: Receipt confirmed by pharmacy (03/25/2016 2:13 PM EDT)   Pharmacy   [facility], [place]

## 2016-06-02 ENCOUNTER — Encounter: Payer: Self-pay | Admitting: Family Medicine

## 2016-06-02 ENCOUNTER — Ambulatory Visit (INDEPENDENT_AMBULATORY_CARE_PROVIDER_SITE_OTHER): Payer: Commercial Managed Care - HMO | Admitting: Family Medicine

## 2016-06-02 VITALS — BP 122/72 | HR 80 | Temp 97.7°F | Resp 16 | Wt 226.0 lb

## 2016-06-02 DIAGNOSIS — R5382 Chronic fatigue, unspecified: Secondary | ICD-10-CM | POA: Diagnosis not present

## 2016-06-02 NOTE — Progress Notes (Signed)
Patient: [name] Male    DOB: 07/03/1933   82 y.o.   MRN: [id number] Visit Date: 06/02/2016  Today's Provider: [name], MD   Chief Complaint  Patient presents with  . Fatigue    worsening over the last month.    Subjective:    HPI Patient comes in today c/o feeling fatigued. Patient reports that he has had symptoms for about 1 month. Patient reports that some days are worse than others. He reports that he does sleep well, but he has to get up at least 2 times to urinate. Patient reports that this is normal for him. Patient also reports that some days he is too tired to even get out of bed. Patient is unaware if he snores while he is sleeping. Patient is wanting to discuss the use of Vitamin B12, and if it would beneficial to take.     No Known Allergies Current Meds  Medication Sig  . amLODipine (NORVASC) 10 MG tablet TAKE 1 TABLET EVERY EVENING  . aspirin 81 MG tablet Take 81 mg by mouth daily.    Marland Kitchen atorvastatin (LIPITOR) 40 MG tablet TAKE 1 TABLET EVERY DAY  . dorzolamide (TRUSOPT) 2 % ophthalmic solution   . doxazosin (CARDURA) 2 MG tablet TAKE 1 TABLET EVERY DAY  . ibuprofen (ADVIL,MOTRIN) 200 MG tablet Take 200 mg by mouth daily as needed for moderate pain.  Marland Kitchen labetalol (NORMODYNE) 200 MG tablet TAKE 1 TABLET TWICE DAILY  . latanoprost (XALATAN) 0.005 % ophthalmic solution 1 drop at bedtime.    Marland Kitchen loratadine (CLARITIN) 10 MG tablet Take 1 tablet (10 mg total) by mouth daily.  Marland Kitchen LORazepam (ATIVAN) 2 MG tablet TAKE 1 TABLET AT BEDTIME AS NEEDED FOR SLEEP  . losartan-hydrochlorothiazide (HYZAAR) 100-25 MG tablet TAKE 1 TABLET EVERY DAY  . meclizine (ANTIVERT) 25 MG tablet Take 1 tablet by mouth daily as needed for dizziness.   Marland Kitchen omeprazole (PRILOSEC) 20 MG capsule TAKE 1 CAPSULE EVERY DAY  . potassium chloride SA (K-DUR,KLOR-CON) 20 MEQ tablet TAKE 1 TABLET EVERY DAY  . ranitidine (ZANTAC) 150 MG tablet Take 1 tablet (150 mg total) by mouth 2 (two) times  daily.  . ranitidine (ZANTAC) 150 MG tablet Take 1 tablet (150 mg total) by mouth at bedtime.    Review of Systems  Constitutional: Positive for activity change and fatigue. Negative for appetite change, chills, diaphoresis, fever and unexpected weight change.  Eyes: Negative.   Respiratory: Negative.   Cardiovascular: Negative.   Gastrointestinal: Negative.   Endocrine: Negative.   Musculoskeletal: Negative.   Skin: Negative.   Allergic/Immunologic: Negative.   Neurological: Negative.   Psychiatric/Behavioral: Negative.     Social History  Substance Use Topics  . Smoking status: Never Smoker  . Smokeless tobacco: Not on file  . Alcohol use Yes     Comment: drinks a beer occasionally   Objective:   BP 122/72 (BP Location: Right Arm, Patient Position: Sitting, Cuff Size: Normal)   Pulse 80   Temp 97.7 F (36.5 C)   Resp 16   Wt 226 lb (102.5 kg)   BMI 34.87 kg/m   Physical Exam  Constitutional: He is oriented to person, place, and time. He appears well-developed and well-nourished.  HENT:  Head: Normocephalic and atraumatic.  Right Ear: External ear normal.  Left Ear: External ear normal.  Nose: Nose normal.  Eyes: Conjunctivae and EOM are normal. Pupils are equal, round, and reactive to light.  Neck: Neck supple. No thyromegaly present.  Cardiovascular: Normal rate, regular rhythm and normal heart sounds.   Pulmonary/Chest: Effort normal and breath sounds normal.  Abdominal: Soft. Bowel sounds are normal.  Lymphadenopathy:    He has no cervical adenopathy.  Neurological: He is alert and oriented to person, place, and time. A cranial nerve deficit is present. He exhibits normal muscle tone.  Skin: Skin is warm and dry.  Psychiatric: He has a normal mood and affect. His behavior is normal. Judgment and thought content normal.        Assessment & Plan:     1. Chronic fatigue Will obtain labs as below. Patient will discontinue Lipitor for the next 2 weeks to see  if this has been contributing to his symptoms. We will follow up in 2-4 weeks to reassess. No obvious etiology presently. - CBC with Differential/Platelet - TSH - Comprehensive metabolic panel - VITAMIN D 25 Hydroxy (Vit-D Deficiency, Fractures) - CK (Creatine Kinase) - Sed Rate (ESR) - B12 - EKG 12-Lead 2.CAD All risk factors treated. 3.HLD Stop statin for now.This could be etiology. I have done the exam and reviewed the above chart and it is accurate to the best of my knowledge.       Islam Villescas Cranford Mon, MD  [facility]

## 2016-06-02 NOTE — Patient Instructions (Signed)
Stop Lipitor (atorvastatin) for the next 2 weeks.

## 2016-06-03 ENCOUNTER — Telehealth: Payer: Self-pay

## 2016-06-03 LAB — CBC WITH DIFFERENTIAL/PLATELET
BASOS ABS: 0 10*3/uL (ref 0.0–0.2)
Basos: 1 %
EOS (ABSOLUTE): 0.3 10*3/uL (ref 0.0–0.4)
Eos: 4 %
Hematocrit: 42.4 % (ref 37.5–51.0)
Hemoglobin: 14.4 g/dL (ref 12.6–17.7)
IMMATURE GRANS (ABS): 0 10*3/uL (ref 0.0–0.1)
IMMATURE GRANULOCYTES: 1 %
LYMPHS: 22 %
Lymphocytes Absolute: 1.3 10*3/uL (ref 0.7–3.1)
MCH: 31.3 pg (ref 26.6–33.0)
MCHC: 34 g/dL (ref 31.5–35.7)
MCV: 92 fL (ref 79–97)
Monocytes Absolute: 0.6 10*3/uL (ref 0.1–0.9)
Monocytes: 10 %
NEUTROS PCT: 62 %
Neutrophils Absolute: 3.6 10*3/uL (ref 1.4–7.0)
PLATELETS: 227 10*3/uL (ref 150–379)
RBC: 4.6 x10E6/uL (ref 4.14–5.80)
RDW: 14.6 % (ref 12.3–15.4)
WBC: 5.7 10*3/uL (ref 3.4–10.8)

## 2016-06-03 LAB — COMPREHENSIVE METABOLIC PANEL
ALT: 37 IU/L (ref 0–44)
AST: 20 IU/L (ref 0–40)
Albumin/Globulin Ratio: 2 (ref 1.2–2.2)
Albumin: 4.5 g/dL (ref 3.5–4.7)
Alkaline Phosphatase: 92 IU/L (ref 39–117)
BUN/Creatinine Ratio: 15 (ref 10–24)
BUN: 21 mg/dL (ref 8–27)
Bilirubin Total: 0.3 mg/dL (ref 0.0–1.2)
CALCIUM: 9.6 mg/dL (ref 8.6–10.2)
CHLORIDE: 102 mmol/L (ref 96–106)
CO2: 24 mmol/L (ref 18–29)
Creatinine, Ser: 1.42 mg/dL — ABNORMAL HIGH (ref 0.76–1.27)
GFR calc non Af Amer: 46 mL/min/{1.73_m2} — ABNORMAL LOW (ref >59)
GFR, EST AFRICAN AMERICAN: 53 mL/min/{1.73_m2} — AB (ref >59)
GLUCOSE: 111 mg/dL — AB (ref 65–99)
Globulin, Total: 2.3 g/dL (ref 1.5–4.5)
POTASSIUM: 4.4 mmol/L (ref 3.5–5.2)
SODIUM: 142 mmol/L (ref 134–144)
Total Protein: 6.8 g/dL (ref 6.0–8.5)

## 2016-06-03 LAB — CK: CK TOTAL: 182 U/L (ref 24–204)

## 2016-06-03 LAB — VITAMIN D 25 HYDROXY (VIT D DEFICIENCY, FRACTURES): Vit D, 25-Hydroxy: 28.5 ng/mL — ABNORMAL LOW (ref 30.0–100.0)

## 2016-06-03 LAB — SEDIMENTATION RATE: SED RATE: 5 mm/h (ref 0–30)

## 2016-06-03 LAB — TSH: TSH: 2.11 u[IU]/mL (ref 0.450–4.500)

## 2016-06-03 LAB — VITAMIN B12: VITAMIN B 12: 385 pg/mL (ref 211–946)

## 2016-06-03 NOTE — Telephone Encounter (Signed)
Left message to call back  

## 2016-06-03 NOTE — Telephone Encounter (Signed)
Pt advised.   Thanks,   -Laura  

## 2016-06-03 NOTE — Telephone Encounter (Signed)
Pt returned call. Thanks TNP °

## 2016-06-03 NOTE — Telephone Encounter (Signed)
-----   Message from Jerrol Banana., MD sent at 06/03/2016 10:02 AM EDT ----- Labs all okay.

## 2016-06-05 ENCOUNTER — Other Ambulatory Visit: Payer: Self-pay | Admitting: *Deleted

## 2016-06-05 MED ORDER — POTASSIUM CHLORIDE CRYS ER 20 MEQ PO TBCR: 20.0000 meq | EXTENDED_RELEASE_TABLET | Freq: Every day | ORAL | 0 refills | Status: DC

## 2016-06-12 ENCOUNTER — Encounter: Payer: Self-pay | Admitting: Family Medicine

## 2016-06-12 ENCOUNTER — Ambulatory Visit (INDEPENDENT_AMBULATORY_CARE_PROVIDER_SITE_OTHER): Payer: Commercial Managed Care - HMO | Admitting: Family Medicine

## 2016-06-12 VITALS — BP 128/70 | HR 72 | Temp 98.7°F | Resp 16 | Wt 221.0 lb

## 2016-06-12 DIAGNOSIS — E785 Hyperlipidemia, unspecified: Secondary | ICD-10-CM | POA: Diagnosis not present

## 2016-06-12 DIAGNOSIS — R5382 Chronic fatigue, unspecified: Secondary | ICD-10-CM | POA: Diagnosis not present

## 2016-06-12 MED ORDER — ROSUVASTATIN CALCIUM 20 MG PO TABS: 20.0000 mg | ORAL_TABLET | Freq: Every day | ORAL | 3 refills | Status: DC

## 2016-06-12 NOTE — Patient Instructions (Signed)
Start Vitamin D and CoQ10 over the counter.  Have cardiologist recheck cholesterol at your visit.

## 2016-06-12 NOTE — Progress Notes (Signed)
Patient: [name] Male    DOB: [birth year]   83 y.o.   MRN: [id number] Visit Date: 06/12/2016  Today's Provider: [name], MD   Chief Complaint  Patient presents with  . Fatigue   Subjective:    HPI Patient comes in today for a follow up on fatigue. Patient had labs done on 06/02/16 and everything was normal. Patient was also advised to discontinue Lipitor to help with muscle weakness. Patient reports that he doesn't have the weakness in his legs anymore, but he does still feel tired.     No Known Allergies Current Meds  Medication Sig  . amLODipine (NORVASC) 10 MG tablet TAKE 1 TABLET EVERY EVENING  . aspirin 81 MG tablet Take 81 mg by mouth daily.    . dorzolamide (TRUSOPT) 2 % ophthalmic solution   . doxazosin (CARDURA) 2 MG tablet TAKE 1 TABLET EVERY DAY  . ibuprofen (ADVIL,MOTRIN) 200 MG tablet Take 200 mg by mouth daily as needed for moderate pain.  Marland Kitchen labetalol (NORMODYNE) 200 MG tablet TAKE 1 TABLET TWICE DAILY  . latanoprost (XALATAN) 0.005 % ophthalmic solution 1 drop at bedtime.    Marland Kitchen loratadine (CLARITIN) 10 MG tablet Take 1 tablet (10 mg total) by mouth daily.  Marland Kitchen LORazepam (ATIVAN) 2 MG tablet TAKE 1 TABLET AT BEDTIME AS NEEDED FOR SLEEP  . losartan-hydrochlorothiazide (HYZAAR) 100-25 MG tablet TAKE 1 TABLET EVERY DAY  . meclizine (ANTIVERT) 25 MG tablet Take 1 tablet by mouth daily as needed for dizziness.   Marland Kitchen omeprazole (PRILOSEC) 20 MG capsule TAKE 1 CAPSULE EVERY DAY  . potassium chloride SA (K-DUR,KLOR-CON) 20 MEQ tablet Take 1 tablet (20 mEq total) by mouth daily.  . ranitidine (ZANTAC) 150 MG tablet Take 1 tablet (150 mg total) by mouth 2 (two) times daily.  . ranitidine (ZANTAC) 150 MG tablet Take 1 tablet (150 mg total) by mouth at bedtime.    Review of Systems  Constitutional: Positive for activity change and fatigue. Negative for appetite change, chills, diaphoresis, fever and unexpected weight change.  Respiratory: Negative.     Cardiovascular: Negative.   Endocrine: Negative.   Musculoskeletal: Negative for arthralgias, back pain, gait problem, joint swelling, myalgias, neck pain and neck stiffness.  Skin: Negative.   Allergic/Immunologic: Negative.   Neurological: Negative.   Hematological: Negative.   Psychiatric/Behavioral: Negative.     Social History  Substance Use Topics  . Smoking status: Never Smoker  . Smokeless tobacco: Not on file  . Alcohol use Yes     Comment: drinks a beer occasionally   Objective:   BP 128/70   Pulse 72   Temp 98.7 F (37.1 C)   Resp 16   Wt 221 lb (100.2 kg)   BMI 34.10 kg/m   Physical Exam  Constitutional: He is oriented to person, place, and time. He appears well-developed and well-nourished.  HENT:  Head: Normocephalic and atraumatic.  Right Ear: External ear normal.  Left Ear: External ear normal.  Nose: Nose normal.  Eyes: Conjunctivae are normal. No scleral icterus.  Neck: No thyromegaly present.  Cardiovascular: Normal rate, regular rhythm and normal heart sounds.   Pulmonary/Chest: Effort normal and breath sounds normal.  Abdominal: Soft.  Musculoskeletal: He exhibits no tenderness.  Patient has asymmetry of his lower rib cage with the left being a little more prominent than the right. I think this is a normal variant. He is status post CABG. He has noticed his xiphoid process recently  and ask about this. I explained to him that this is a normal air anatomical finding  Neurological: He is alert and oriented to person, place, and time.  Skin: Skin is warm and dry.  Psychiatric: He has a normal mood and affect. His behavior is normal. Judgment and thought content normal.        Assessment & Plan:     1. 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 Her discussion with patient and in trying to treat his CAD aggressively will try rosuvastatin 20 mg daily and CoQ10 daily with this. Repeat lipids and CK in one to 2 months    3. CAD all  risk factors treated [name], MD  [facility]

## 2016-06-18 DIAGNOSIS — D0462 Carcinoma in situ of skin of left upper limb, including shoulder: Secondary | ICD-10-CM | POA: Diagnosis not present

## 2016-07-03 ENCOUNTER — Other Ambulatory Visit: Payer: Self-pay | Admitting: *Deleted

## 2016-07-03 ENCOUNTER — Other Ambulatory Visit: Payer: Self-pay | Admitting: Cardiology

## 2016-07-03 MED ORDER — POTASSIUM CHLORIDE CRYS ER 20 MEQ PO TBCR: 20.0000 meq | EXTENDED_RELEASE_TABLET | Freq: Every day | ORAL | 0 refills | Status: DC

## 2016-07-03 NOTE — Progress Notes (Signed)
HPI: FU coronary artery disease status post coronary bypassing graft in May 1997. He also has a history of atrial flutter ablation. Previous abdominal ultrasound in May 2006 showed no significant renal artery stenosis and normal caliber aorta. Nuclear study 5/16 showed inferior ischemia and EF 61; we are treating medically. Since I last saw him, the patient denies any dyspnea on exertion, orthopnea, PND, pedal edema, palpitations, syncope or chest pain.    Current Outpatient Prescriptions  Medication Sig Dispense Refill  . amLODipine (NORVASC) 10 MG tablet TAKE 1 TABLET EVERY EVENING 90 tablet 3  . aspirin 81 MG tablet Take 81 mg by mouth daily.      . dorzolamide (TRUSOPT) 2 % ophthalmic solution     . doxazosin (CARDURA) 2 MG tablet TAKE 1 TABLET EVERY DAY 90 tablet 3  . ibuprofen (ADVIL,MOTRIN) 200 MG tablet Take 200 mg by mouth daily as needed for moderate pain.    Marland Kitchen labetalol (NORMODYNE) 200 MG tablet TAKE 1 TABLET TWICE DAILY 180 tablet 3  . latanoprost (XALATAN) 0.005 % ophthalmic solution 1 drop at bedtime.      Marland Kitchen loratadine (CLARITIN) 10 MG tablet Take 1 tablet (10 mg total) by mouth daily. 30 tablet 11  . LORazepam (ATIVAN) 2 MG tablet TAKE 1 TABLET AT BEDTIME AS NEEDED FOR SLEEP 90 tablet 1  . losartan-hydrochlorothiazide (HYZAAR) 100-25 MG tablet TAKE 1 TABLET EVERY DAY 90 tablet 3  . meclizine (ANTIVERT) 25 MG tablet Take 1 tablet by mouth daily as needed for dizziness.     Marland Kitchen omeprazole (PRILOSEC) 20 MG capsule TAKE 1 CAPSULE EVERY DAY 90 capsule 3  . potassium chloride SA (K-DUR,KLOR-CON) 20 MEQ tablet Take 1 tablet (20 mEq total) by mouth daily. 90 tablet 0  . ranitidine (ZANTAC) 150 MG tablet Take 1 tablet (150 mg total) by mouth 2 (two) times daily. 30 tablet 0  . rosuvastatin (CRESTOR) 20 MG tablet Take 1 tablet (20 mg total) by mouth daily. 90 tablet 3   No current facility-administered medications for this visit.      Past Medical History:  Diagnosis Date  .  Anemia, unspecified   . Anxiety state, unspecified   . Coronary atherosclerosis   . GERD (gastroesophageal reflux disease)   . Osteoarthritis    group B streptococcal septic arthritis  . Unspecified essential hypertension     History reviewed. No pertinent surgical history.  Social History   Social History  . Marital status: Divorced    Spouse name: N/A  . Number of children: N/A  . Years of education: N/A   Occupational History  . Not on file.   Social History Main Topics  . Smoking status: Never Smoker  . Smokeless tobacco: Not on file  . Alcohol use Yes     Comment: drinks a beer occasionally  . Drug use: No  . Sexual activity: Not on file   Other Topics Concern  . Not on file   Social History Narrative  . No narrative on file    Family History  Problem Relation Age of Onset  . Parkinson's disease Mother   . Heart disease Mother   . Dementia Mother   . Cancer Father     lung cancer, smoker  . Depression Sister   . Fibromyalgia Sister   . Arthritis Brother     B/L knee replacement  . Sleep apnea Brother     ROS: back pain but no fevers or chills, productive cough, hemoptysis,  dysphasia, odynophagia, melena, hematochezia, dysuria, hematuria, rash, seizure activity, orthopnea, PND, pedal edema, claudication. Remaining systems are negative.  Physical Exam: Well-developed well-nourished in no acute distress.  Skin is warm and dry.  HEENT is normal.  Neck is supple.  Chest is clear to auscultation with normal expansion.  Cardiovascular exam is regular rate and rhythm.  Abdominal exam nontender or distended. No masses palpated. Extremities show no edema. neuro grossly intact  ECG-sinus, First degree AV block, left axis deviation, prior septal infarct cannot be excluded.  A/P  1 Coronary artery disease-continue aspirin and statin. Previous nuclear study showed mild inferior ischemia but we are treating medically as it was not a high risk study and  patient is asymptomatic.  2 hypertension-blood pressure controlled. Continue present medications.  3 hyperlipidemia-continue statin.  Richard Ruths, MD

## 2016-07-03 NOTE — Telephone Encounter (Signed)
Patient left a msg on the refill vm requesting that this be sent in for a ninety day supply as he stated that last time he only received a thirty day supply.

## 2016-07-03 NOTE — Telephone Encounter (Signed)
Rx(s) sent to pharmacy electronically.  

## 2016-07-07 ENCOUNTER — Encounter: Payer: Self-pay | Admitting: Cardiology

## 2016-07-07 ENCOUNTER — Ambulatory Visit (INDEPENDENT_AMBULATORY_CARE_PROVIDER_SITE_OTHER): Payer: Commercial Managed Care - HMO | Admitting: Cardiology

## 2016-07-07 VITALS — BP 108/68 | HR 68 | Ht 68.0 in | Wt 216.2 lb

## 2016-07-07 DIAGNOSIS — I10 Essential (primary) hypertension: Secondary | ICD-10-CM | POA: Diagnosis not present

## 2016-07-07 DIAGNOSIS — I2510 Atherosclerotic heart disease of native coronary artery without angina pectoris: Secondary | ICD-10-CM

## 2016-07-07 DIAGNOSIS — E785 Hyperlipidemia, unspecified: Secondary | ICD-10-CM | POA: Diagnosis not present

## 2016-07-07 MED ORDER — POTASSIUM CHLORIDE CRYS ER 20 MEQ PO TBCR: 20.0000 meq | EXTENDED_RELEASE_TABLET | Freq: Every day | ORAL | 3 refills | Status: DC

## 2016-07-07 NOTE — Patient Instructions (Signed)
Your physician wants you to follow-up in: ONE YEAR WITH DR CRENSHAW You will receive a reminder letter in the mail two months in advance. If you don't receive a letter, please call our office to schedule the follow-up appointment.   If you need a refill on your cardiac medications before your next appointment, please call your pharmacy.  

## 2016-07-21 ENCOUNTER — Encounter: Payer: Self-pay | Admitting: Family Medicine

## 2016-07-21 ENCOUNTER — Ambulatory Visit (INDEPENDENT_AMBULATORY_CARE_PROVIDER_SITE_OTHER): Payer: Commercial Managed Care - HMO | Admitting: Family Medicine

## 2016-07-21 ENCOUNTER — Ambulatory Visit
Admission: RE | Admit: 2016-07-21 | Discharge: 2016-07-21 | Disposition: A | Discharge status: Home / Self Care | Payer: Commercial Managed Care - HMO | Source: Ambulatory Visit | Attending: Family Medicine | Admitting: Family Medicine

## 2016-07-21 VITALS — BP 118/72 | HR 80 | Temp 98.2°F | Resp 16 | Wt 221.0 lb

## 2016-07-21 DIAGNOSIS — H40153 Residual stage of open-angle glaucoma, bilateral: Secondary | ICD-10-CM | POA: Diagnosis not present

## 2016-07-21 DIAGNOSIS — R222 Localized swelling, mass and lump, trunk: Secondary | ICD-10-CM | POA: Diagnosis not present

## 2016-07-21 DIAGNOSIS — R0789 Other chest pain: Secondary | ICD-10-CM | POA: Insufficient documentation

## 2016-07-21 DIAGNOSIS — I2510 Atherosclerotic heart disease of native coronary artery without angina pectoris: Secondary | ICD-10-CM

## 2016-07-21 DIAGNOSIS — H524 Presbyopia: Secondary | ICD-10-CM | POA: Diagnosis not present

## 2016-07-21 NOTE — Progress Notes (Signed)
[name]  MRN: [id number] DOB: 1933/06/08  Subjective:  HPI  Patient was seen in August and patient noted that he has had some discomfort on the left side in the rib area and physical exam was done and thought it is benign. He is still having discomfort in that area with certain movements. He has had this for about 2 to 3 years. He would like to go ahead and get this worked up now. Patient is specifically worried about Paget's disease.   Patient Active Problem List   Diagnosis Date Noted  . Arthropathy of hand 06/11/2015  . Arteriosclerosis of coronary artery 06/11/2015  . Impotence of organic origin 06/11/2015  . Generalized anxiety disorder 06/11/2015  . Acid reflux 06/11/2015  . History of other malignant neoplasm of skin 06/11/2015  . HLD (hyperlipidemia) 06/11/2015  . Arthritis, degenerative 06/11/2015  . Adiposity 06/11/2015  . Venous insufficiency of leg 06/11/2015  . HYPERLIPIDEMIA 08/16/2009  . ANEMIA 08/16/2009  . ANXIETY 08/16/2009  . GERD 08/16/2009  . OSTEOARTHRITIS 08/16/2009  . Umbilical hernia without obstruction and without gangrene 03/28/2008  . Essential hypertension 10/20/2006  . Coronary atherosclerosis 10/20/2006  . CARDIAC ARRHYTHMIA 10/20/2006  . SEPTIC ARTHRITIS 10/20/2006    Past Medical History:  Diagnosis Date  . Anemia, unspecified   . Anxiety state, unspecified   . Coronary atherosclerosis   . GERD (gastroesophageal reflux disease)   . Osteoarthritis    group B streptococcal septic arthritis  . Unspecified essential hypertension     Social History   Social History  . Marital status: Divorced    Spouse name: N/A  . Number of children: N/A  . Years of education: N/A   Occupational History  . Not on file.   Social History Main Topics  . Smoking status: Never Smoker  . Smokeless tobacco: Not on file  . Alcohol use Yes     Comment: drinks a beer occasionally  . Drug use: No  . Sexual activity: Not on file   Other Topics  Concern  . Not on file   Social History Narrative  . No narrative on file    Outpatient Encounter Prescriptions as of 07/21/2016  Medication Sig Note  . amLODipine (NORVASC) 10 MG tablet TAKE 1 TABLET EVERY EVENING   . aspirin 81 MG tablet Take 81 mg by mouth daily.     . dorzolamide (TRUSOPT) 2 % ophthalmic solution  02/07/2014: Received from: External Pharmacy  . doxazosin (CARDURA) 2 MG tablet TAKE 1 TABLET EVERY DAY   . ibuprofen (ADVIL,MOTRIN) 200 MG tablet Take 200 mg by mouth daily as needed for moderate pain.   Marland Kitchen labetalol (NORMODYNE) 200 MG tablet TAKE 1 TABLET TWICE DAILY   . latanoprost (XALATAN) 0.005 % ophthalmic solution 1 drop at bedtime.     Marland Kitchen loratadine (CLARITIN) 10 MG tablet Take 1 tablet (10 mg total) by mouth daily.   Marland Kitchen LORazepam (ATIVAN) 2 MG tablet TAKE 1 TABLET AT BEDTIME AS NEEDED FOR SLEEP   . losartan-hydrochlorothiazide (HYZAAR) 100-25 MG tablet TAKE 1 TABLET EVERY DAY   . meclizine (ANTIVERT) 25 MG tablet Take 1 tablet by mouth daily as needed for dizziness.  02/07/2015: Received from: External Pharmacy Received Sig:   . omeprazole (PRILOSEC) 20 MG capsule TAKE 1 CAPSULE EVERY DAY   . potassium chloride SA (K-DUR,KLOR-CON) 20 MEQ tablet Take 1 tablet (20 mEq total) by mouth daily.   . ranitidine (ZANTAC) 150 MG tablet Take 1 tablet (150 mg total) by  mouth 2 (two) times daily.   . rosuvastatin (CRESTOR) 20 MG tablet Take 1 tablet (20 mg total) by mouth daily.    No facility-administered encounter medications on file as of 07/21/2016.     No Known Allergies  Review of Systems  Constitutional: Negative.   Respiratory: Negative.   Cardiovascular: Negative.   Musculoskeletal: Positive for myalgias.       Rib area discomfort   Objective:  BP 118/72   Pulse 80   Temp 98.2 F (36.8 C)   Resp 16   Wt 221 lb (100.2 kg)   SpO2 95%   BMI 33.60 kg/m   Physical Exam  Constitutional: He is oriented to person, place, and time and well-developed,  well-nourished, and in no distress.  Cardiovascular: Normal rate, regular rhythm, normal heart sounds and intact distal pulses.   No murmur heard. Pulmonary/Chest: Effort normal and breath sounds normal. No respiratory distress. He has no wheezes.  Mild asymmetry with a little protrusion of the left ribs  Neurological: He is alert and oriented to person, place, and time.  The left lower anterior ribs protrude a little bit more than the mirror image on the right side. These are also known as "floating ribs".  Assessment and Plan :  1. Left-sided chest wall pain Will get chest xray, pending results. If the results are normal, we will follow, may need further plan of care. This is asymptomatic and lab work was normal. No hypercalcemia. I do not think patient needs CT scan. Happy to refer to specialist at any point in time at patient request. - DG Chest 2 View; Future  2. Atherosclerosis of native coronary artery of native heart without angina pectoris  HPI, Exam and A&P transcribed under direction and in the presence of Miguel Aschoff, MD.

## 2016-07-22 ENCOUNTER — Telehealth: Payer: Self-pay

## 2016-07-22 NOTE — Telephone Encounter (Signed)
-----   Message from Jerrol Banana., MD sent at 07/21/2016  3:45 PM EDT ----- Normal--including ribs

## 2016-07-22 NOTE — Telephone Encounter (Signed)
Pt advised.   Thanks,   -Laura  

## 2016-07-29 ENCOUNTER — Telehealth: Payer: Self-pay | Admitting: Family Medicine

## 2016-07-29 NOTE — Telephone Encounter (Signed)
Called Pt to schedule AWV with [facility] prior to CPE with PCP for 10/25 - knb

## 2016-08-04 ENCOUNTER — Other Ambulatory Visit: Payer: Self-pay | Admitting: Family Medicine

## 2016-08-06 ENCOUNTER — Ambulatory Visit (INDEPENDENT_AMBULATORY_CARE_PROVIDER_SITE_OTHER): Payer: Commercial Managed Care - HMO | Admitting: Family Medicine

## 2016-08-06 VITALS — BP 123/74 | HR 76 | Temp 97.8°F | Ht 67.0 in | Wt 219.2 lb

## 2016-08-06 VITALS — BP 123/74 | HR 76 | Temp 97.8°F | Resp 16 | Ht 67.0 in | Wt 219.0 lb

## 2016-08-06 DIAGNOSIS — Z1211 Encounter for screening for malignant neoplasm of colon: Secondary | ICD-10-CM | POA: Diagnosis not present

## 2016-08-06 DIAGNOSIS — I2510 Atherosclerotic heart disease of native coronary artery without angina pectoris: Secondary | ICD-10-CM | POA: Diagnosis not present

## 2016-08-06 DIAGNOSIS — E785 Hyperlipidemia, unspecified: Secondary | ICD-10-CM

## 2016-08-06 DIAGNOSIS — Z0000 Encounter for general adult medical examination without abnormal findings: Secondary | ICD-10-CM

## 2016-08-06 DIAGNOSIS — I10 Essential (primary) hypertension: Secondary | ICD-10-CM | POA: Diagnosis not present

## 2016-08-06 DIAGNOSIS — R7303 Prediabetes: Secondary | ICD-10-CM | POA: Diagnosis not present

## 2016-08-06 LAB — IFOBT (OCCULT BLOOD): IFOBT: NEGATIVE

## 2016-08-06 NOTE — Progress Notes (Signed)
Patient: [name], Male    DOB: 1933/08/09, 82 y.o.   MRN: [id number] Visit Date: 08/06/2016  Today's Provider: [name], MD   Chief Complaint  Patient presents with  . Annual Exam   Subjective:   [name] is a 82 y.o. male who presents today for his Subsequent Annual Wellness Visit. He feels well. He reports exercising 3 times weekly. He reports he is sleeping fairly well. He has been dating his current girlfriend for 20 years. She is having some problems now with early dementia. Immunization History  Administered Date(s) Administered  . Influenza, High Dose Seasonal PF 08/09/2015, 07/14/2016  . Influenza-Unspecified 07/13/2013  . Pneumococcal Conjugate-13 04/11/2014  . Pneumococcal Polysaccharide-23 08/19/2006  . Td 05/01/2005  . Tdap 06/12/2015  . Zoster 02/05/2011   08/31/2011 Colonoscopy diverticulosis, internal hemorrhoids, otherwise normal  Review of Systems  HENT: Positive for sneezing.   Eyes: Positive for itching.  Respiratory: Negative.   Cardiovascular: Negative.   Gastrointestinal: Negative.   Endocrine: Positive for cold intolerance.  Genitourinary: Positive for urgency.  Musculoskeletal: Positive for arthralgias.  Skin: Negative.   Allergic/Immunologic: Negative.   Neurological: Positive for weakness.  Hematological: Negative.   Psychiatric/Behavioral: Positive for sleep disturbance.    Patient Active Problem List   Diagnosis Date Noted  . Arthropathy of hand 06/11/2015  . Arteriosclerosis of coronary artery 06/11/2015  . Impotence of organic origin 06/11/2015  . Generalized anxiety disorder 06/11/2015  . Acid reflux 06/11/2015  . History of other malignant neoplasm of skin 06/11/2015  . HLD (hyperlipidemia) 06/11/2015  . Arthritis, degenerative 06/11/2015  . Adiposity 06/11/2015  . Venous insufficiency of leg 06/11/2015  . HYPERLIPIDEMIA 08/16/2009  . ANEMIA 08/16/2009  . ANXIETY 08/16/2009  . GERD 08/16/2009  . OSTEOARTHRITIS  08/16/2009  . Umbilical hernia without obstruction and without gangrene 03/28/2008  . Essential hypertension 10/20/2006  . Coronary atherosclerosis 10/20/2006  . CARDIAC ARRHYTHMIA 10/20/2006  . SEPTIC ARTHRITIS 10/20/2006    Social History   Social History  . Marital status: Divorced    Spouse name: N/A  . Number of children: N/A  . Years of education: N/A   Occupational History  . Not on file.   Social History Main Topics  . Smoking status: Never Smoker  . Smokeless tobacco: Never Used  . Alcohol use Yes     Comment: drinks a beer occasionally  . Drug use: No  . Sexual activity: Not on file   Other Topics Concern  . Not on file   Social History Narrative  . No narrative on file    No past surgical history on file.  His family history includes Arthritis in his brother; Cancer in his father; Dementia in his mother; Depression in his sister; Fibromyalgia in his sister; Heart disease in his mother; Parkinson's disease in his mother; Sleep apnea in his brother.    Outpatient Encounter Prescriptions as of 08/06/2016  Medication Sig Note  . amLODipine (NORVASC) 10 MG tablet TAKE 1 TABLET EVERY EVENING   . aspirin 81 MG tablet Take 81 mg by mouth daily.     . Cholecalciferol (VITAMIN D3) 2000 units TABS Take 1 tablet by mouth daily.   Marland Kitchen co-enzyme Q-10 30 MG capsule Take 30 mg by mouth daily.   . dorzolamide (TRUSOPT) 2 % ophthalmic solution  02/07/2014: Received from: External Pharmacy  . doxazosin (CARDURA) 2 MG tablet TAKE 1 TABLET EVERY DAY   . ibuprofen (ADVIL,MOTRIN) 200 MG tablet Take 200 mg by mouth  daily as needed for moderate pain.   Marland Kitchen labetalol (NORMODYNE) 200 MG tablet TAKE 1 TABLET TWICE DAILY   . latanoprost (XALATAN) 0.005 % ophthalmic solution 1 drop at bedtime.     Marland Kitchen loratadine (CLARITIN) 10 MG tablet Take 1 tablet (10 mg total) by mouth daily.   Marland Kitchen LORazepam (ATIVAN) 2 MG tablet TAKE 1 TABLET AT BEDTIME AS NEEDED FOR SLEEP   . losartan-hydrochlorothiazide  (HYZAAR) 100-25 MG tablet TAKE 1 TABLET EVERY DAY   . meclizine (ANTIVERT) 25 MG tablet Take 1 tablet by mouth daily as needed for dizziness.  02/07/2015: Received from: External Pharmacy Received Sig:   . Omega-3 Fatty Acids (FISH OIL) 1000 MG CAPS Take by mouth daily.   Marland Kitchen omeprazole (PRILOSEC) 20 MG capsule TAKE 1 CAPSULE EVERY DAY   . potassium chloride SA (K-DUR,KLOR-CON) 20 MEQ tablet Take 1 tablet (20 mEq total) by mouth daily.   . ranitidine (ZANTAC) 150 MG tablet Take 1 tablet (150 mg total) by mouth 2 (two) times daily.   . rosuvastatin (CRESTOR) 20 MG tablet Take 1 tablet (20 mg total) by mouth daily.    No facility-administered encounter medications on file as of 08/06/2016.     No Known Allergies  Patient Care Team: Jerrol Banana., MD as PCP - General (Unknown Physician Specialty) Lelon Perla, MD (Cardiology) Crista Curb. Gloriann Loan, MD as Consulting Physician (Ophthalmology) Beverly Gust, MD as Consulting Physician (Otolaryngology)  Objective:   Vitals:  Vitals:   08/06/16 0952  BP: 123/74  Pulse: 76  Resp: 16  Temp: 97.8 F (36.6 C)  TempSrc: Oral  Weight: 219 lb (99.3 kg)  Height: 5\' 7"  (1.702 m)    Physical Exam  Constitutional: He is oriented to person, place, and time. He appears well-developed and well-nourished.  HENT:  Head: Normocephalic and atraumatic.  Right Ear: External ear normal.  Left Ear: External ear normal.  Nose: Nose normal.  Mouth/Throat: Oropharynx is clear and moist.  Eyes: Conjunctivae and EOM are normal. Pupils are equal, round, and reactive to light.  Neck: Normal range of motion. Neck supple.  Cardiovascular: Normal rate, regular rhythm, normal heart sounds and intact distal pulses.   Pulmonary/Chest: Effort normal and breath sounds normal.  Abdominal: Soft. Bowel sounds are normal.  Genitourinary: Rectum normal, prostate normal and penis normal.  Musculoskeletal: Normal range of motion.  Neurological: He is alert and  oriented to person, place, and time.  Skin: Skin is warm and dry.  Multiple seborrheic keratosis on his trunk  Psychiatric: He has a normal mood and affect. His behavior is normal. Judgment and thought content normal.    Activities of Daily Living In your present state of health, do you have any difficulty performing the following activities: 08/06/2016 04/29/2016  Hearing? Y N  Vision? N N  Difficulty concentrating or making decisions? N N  Walking or climbing stairs? N N  Dressing or bathing? N N  Doing errands, shopping? N N  Preparing Food and eating ? N -  Using the Toilet? N -  In the past six months, have you accidently leaked urine? Y -  Do you have problems with loss of bowel control? N -  Managing your Medications? N -  Managing your Finances? N -  Housekeeping or managing your Housekeeping? N -  Some recent data might be hidden    Fall Risk Assessment Fall Risk  08/06/2016 06/11/2015  Falls in the past year? No No     Depression Screen  PHQ 2/9 Scores 08/06/2016 06/11/2015  PHQ - 2 Score 0 0       Assessment & Plan:     Annual Wellness Visit  Reviewed patient's Family Medical History Reviewed and updated list of patient's medical providers Assessment of cognitive impairment was done Assessed patient's functional ability Established a written schedule for health screening [place] Completed and Reviewed  Exercise Activities and Dietary recommendations Goals    . Increase water intake          Starting 08/06/16, I will increase to 4 glasses of water a day.       Immunization History  Administered Date(s) Administered  . Influenza, High Dose Seasonal PF 08/09/2015, 07/14/2016  . Influenza-Unspecified 07/13/2013  . Pneumococcal Conjugate-13 04/11/2014  . Pneumococcal Polysaccharide-23 08/19/2006  . Td 05/01/2005  . Tdap 06/12/2015  . Zoster 02/05/2011    Health Maintenance  Topic Date Due  . TETANUS/TDAP  06/11/2025  .  INFLUENZA VACCINE  Completed  . ZOSTAVAX  Completed  . PNA vac Low Risk Adult  Completed      Discussed health benefits of physical activity, and encouraged him to engage in regular exercise appropriate for his age and condition.  CAD/CABG 1997 HTN HLD Prediabetes Obtain Lipids/A1C--other lans UTD.  I have done the exam and reviewed the chart and it is accurate to the best of my knowledge. Miguel Aschoff M.D. [facility]

## 2016-08-06 NOTE — Patient Instructions (Signed)
[name] , Thank you for taking time to come for your Medicare Wellness Visit. I appreciate your ongoing commitment to your health goals. Please review the following plan we discussed and let me know if I can assist you in the future.   These are the goals we discussed: Goals    . Increase water intake          Starting 08/06/16, I will increase to 4 glasses of water a day.       This is a list of the screening recommended for you and due dates:  Health Maintenance  Topic Date Due  . Tetanus Vaccine  06/11/2025  . Flu Shot  Completed  . Shingles Vaccine  Completed  . Pneumonia vaccines  Completed   Preventive Care for Adults  A healthy lifestyle and preventive care can promote health and wellness. Preventive health guidelines for adults include the following key practices.  . A routine yearly physical is a good way to check with your health care provider about your health and preventive screening. It is a chance to share any concerns and updates on your health and to receive a thorough exam.  . Visit your dentist for a routine exam and preventive care every 6 months. Brush your teeth twice a day and floss once a day. Good oral hygiene prevents tooth decay and gum disease.  . The frequency of eye exams is based on your age, health, family medical history, use  of contact lenses, and other factors. Follow your health care provider's ecommendations for frequency of eye exams.  . Eat a healthy diet. Foods like vegetables, fruits, whole grains, low-fat dairy products, and lean protein foods contain the nutrients you need without too many calories. Decrease your intake of foods high in solid fats, added sugars, and salt. Eat the right amount of calories for you. Get information about a proper diet from your health care provider, if necessary.  . Regular physical exercise is one of the most important things you can do for your health. Most adults should get at least 150 minutes of  moderate-intensity exercise (any activity that increases your heart rate and causes you to sweat) each week. In addition, most adults need muscle-strengthening exercises on 2 or more days a week.  Silver Sneakers may be a benefit available to you. To determine eligibility, you may visit the website: www.silversneakers.com or contact program at 972-298-6619 Mon-Fri between 8AM-8PM.   . Maintain a healthy weight. The body mass index (BMI) is a screening tool to identify possible weight problems. It provides an estimate of body fat based on height and weight. Your health care provider can find your BMI and can help you achieve or maintain a healthy weight.   For adults 20 years and older: ? A BMI below 18.5 is considered underweight. ? A BMI of 18.5 to 24.9 is normal. ? A BMI of 25 to 29.9 is considered overweight. ? A BMI of 30 and above is considered obese.   . Maintain normal blood lipids and cholesterol levels by exercising and minimizing your intake of saturated fat. Eat a balanced diet with plenty of fruit and vegetables. Blood tests for lipids and cholesterol should begin at age 57 and be repeated every 5 years. If your lipid or cholesterol levels are high, you are over 50, or you are at high risk for heart disease, you may need your cholesterol levels checked more frequently. Ongoing high lipid and cholesterol levels should be treated with medicines  if diet and exercise are not working.  . If you smoke, find out from your health care provider how to quit. If you do not use tobacco, please do not start.  . If you choose to drink alcohol, please do not consume more than 2 drinks per day. One drink is considered to be 12 ounces (355 mL) of beer, 5 ounces (148 mL) of wine, or 1.5 ounces (44 mL) of liquor.  . If you are 68-68 years old, ask your health care provider if you should take aspirin to prevent strokes.  . Use sunscreen. Apply sunscreen liberally and repeatedly throughout the day. You  should seek shade when your shadow is shorter than you. Protect yourself by wearing long sleeves, pants, a wide-brimmed hat, and sunglasses year round, whenever you are outdoors.  . Once a month, do a whole body skin exam, using a mirror to look at the skin on your back. Tell your health care provider of new moles, moles that have irregular borders, moles that are larger than a pencil eraser, or moles that have changed in shape or color.

## 2016-08-06 NOTE — Progress Notes (Addendum)
Subjective:   Macguire Conquest Kingsford is a 82 y.o. male who presents for Medicare Annual/Subsequent preventive examination.  Review of Systems:  N/A Cardiac Risk Factors include: advanced age (>75men, >52 women);dyslipidemia;hypertension;male gender;obesity (BMI >30kg/m2)     Objective:    Vitals: BP 123/74   Pulse 76   Temp 97.8 F (36.6 C) (Oral)   Ht 5\' 7"  (1.702 m)   Wt 219 lb 4 oz (99.5 kg)   BMI 34.34 kg/m   Body mass index is 34.34 kg/m.  Tobacco History  Smoking Status  . Never Smoker  Smokeless Tobacco  . Never Used     Counseling given: No   Past Medical History:  Diagnosis Date  . Anemia, unspecified   . Anxiety state, unspecified   . Coronary atherosclerosis   . GERD (gastroesophageal reflux disease)   . Osteoarthritis    group B streptococcal septic arthritis  . Unspecified essential hypertension    History reviewed. No pertinent surgical history. Family History  Problem Relation Age of Onset  . Parkinson's disease Mother   . Heart disease Mother   . Dementia Mother   . Cancer Father     lung cancer, smoker  . Depression Sister   . Fibromyalgia Sister   . Arthritis Brother     B/L knee replacement  . Sleep apnea Brother    History  Sexual Activity  . Sexual activity: Not on file    Outpatient Encounter Prescriptions as of 08/06/2016  Medication Sig  . amLODipine (NORVASC) 10 MG tablet TAKE 1 TABLET EVERY EVENING  . aspirin 81 MG tablet Take 81 mg by mouth daily.    . Cholecalciferol (VITAMIN D3) 2000 units TABS Take 1 tablet by mouth daily.  Marland Kitchen co-enzyme Q-10 30 MG capsule Take 30 mg by mouth daily.  . dorzolamide (TRUSOPT) 2 % ophthalmic solution   . doxazosin (CARDURA) 2 MG tablet TAKE 1 TABLET EVERY DAY  . ibuprofen (ADVIL,MOTRIN) 200 MG tablet Take 200 mg by mouth daily as needed for moderate pain.  Marland Kitchen labetalol (NORMODYNE) 200 MG tablet TAKE 1 TABLET TWICE DAILY  . latanoprost (XALATAN) 0.005 % ophthalmic solution 1 drop at bedtime.    Marland Kitchen  loratadine (CLARITIN) 10 MG tablet Take 1 tablet (10 mg total) by mouth daily.  Marland Kitchen LORazepam (ATIVAN) 2 MG tablet TAKE 1 TABLET AT BEDTIME AS NEEDED FOR SLEEP  . losartan-hydrochlorothiazide (HYZAAR) 100-25 MG tablet TAKE 1 TABLET EVERY DAY  . meclizine (ANTIVERT) 25 MG tablet Take 1 tablet by mouth daily as needed for dizziness.   . Omega-3 Fatty Acids (FISH OIL) 1000 MG CAPS Take by mouth daily.  Marland Kitchen omeprazole (PRILOSEC) 20 MG capsule TAKE 1 CAPSULE EVERY DAY  . potassium chloride SA (K-DUR,KLOR-CON) 20 MEQ tablet Take 1 tablet (20 mEq total) by mouth daily.  . ranitidine (ZANTAC) 150 MG tablet Take 1 tablet (150 mg total) by mouth 2 (two) times daily.  . rosuvastatin (CRESTOR) 20 MG tablet Take 1 tablet (20 mg total) by mouth daily.   No facility-administered encounter medications on file as of 08/06/2016.     Activities of Daily Living In your present state of health, do you have any difficulty performing the following activities: 08/06/2016 04/29/2016  Hearing? Y N  Vision? N N  Difficulty concentrating or making decisions? N N  Walking or climbing stairs? N N  Dressing or bathing? N N  Doing errands, shopping? N N  Preparing Food and eating ? N -  Using the Toilet?  N -  In the past six months, have you accidently leaked urine? Y -  Do you have problems with loss of bowel control? N -  Managing your Medications? N -  Managing your Finances? N -  Housekeeping or managing your Housekeeping? N -  Some recent data might be hidden    Patient Care Team: Jerrol Banana., MD as PCP - General (Unknown Physician Specialty) Lelon Perla, MD (Cardiology) Crista Curb. Gloriann Loan, MD as Consulting Physician (Ophthalmology) Beverly Gust, MD as Consulting Physician (Otolaryngology)   Assessment:     Exercise Activities and Dietary recommendations Current Exercise Habits: Home exercise routine, Type of exercise: treadmill;strength training/weights, Time (Minutes): 25, Frequency  (Times/Week): 3, Weekly Exercise (Minutes/Week): 75, Intensity: Mild  Goals    . Increase water intake          Starting 08/06/16, I will increase to 4 glasses of water a day.      Fall Risk Fall Risk  08/06/2016 06/11/2015  Falls in the past year? No No   Depression Screen PHQ 2/9 Scores 08/06/2016 06/11/2015  PHQ - 2 Score 0 0    Cognitive Function     6CIT Screen 08/06/2016  What Year? 0 points  What month? 0 points  What time? 0 points  Count back from 20 0 points  Months in reverse 0 points  Repeat phrase 0 points  Total Score 0    Immunization History  Administered Date(s) Administered  . Influenza, High Dose Seasonal PF 08/09/2015, 07/14/2016  . Influenza-Unspecified 07/13/2013  . Pneumococcal Conjugate-13 04/11/2014  . Pneumococcal Polysaccharide-23 08/19/2006  . Td 05/01/2005  . Tdap 06/12/2015  . Zoster 02/05/2011   Screening Tests Health Maintenance  Topic Date Due  . TETANUS/TDAP  06/11/2025  . INFLUENZA VACCINE  Completed  . ZOSTAVAX  Completed  . PNA vac Low Risk Adult  Completed      Plan:    I have personally reviewed and addressed the Medicare Annual Wellness questionnaire and have noted the following in the patient's chart:  A. Medical and social history B. Use of alcohol, tobacco or illicit drugs  C. Current medications and supplements D. Functional ability and status E.  Nutritional status F.  Physical activity G. Advance directives H. List of other physicians I.  Hospitalizations, surgeries, and ER visits in previous 12 months J.  [place] such as hearing and vision if needed, cognitive and depression L. Referrals and appointments - none  In addition, I have reviewed and discussed with patient certain preventive protocols, quality metrics, and best practice recommendations. A written personalized care plan for preventive services as well as general preventive health recommendations were provided to patient.  See  attached scanned questionnaire for additional information.   Signed,  Fabio Neighbors, LPN Nurse Health Advisor I have reviewed the information as  put it by [facility] LPN and was available for any questions or concerns. Miguel Aschoff MD [facility]

## 2016-08-07 DIAGNOSIS — R7303 Prediabetes: Secondary | ICD-10-CM | POA: Diagnosis not present

## 2016-08-07 DIAGNOSIS — E785 Hyperlipidemia, unspecified: Secondary | ICD-10-CM | POA: Diagnosis not present

## 2016-08-08 LAB — LIPID PANEL WITH LDL/HDL RATIO
CHOLESTEROL TOTAL: 112 mg/dL (ref 100–199)
HDL: 35 mg/dL — ABNORMAL LOW (ref >39)
LDL CALC: 55 mg/dL (ref 0–99)
LDL/HDL RATIO: 1.6 ratio (ref 0.0–3.6)
Triglycerides: 108 mg/dL (ref 0–149)
VLDL CHOLESTEROL CAL: 22 mg/dL (ref 5–40)

## 2016-08-08 LAB — HEMOGLOBIN A1C
ESTIMATED AVERAGE GLUCOSE: 126 mg/dL
Hgb A1c MFr Bld: 6 % — ABNORMAL HIGH (ref 4.8–5.6)

## 2016-10-20 DIAGNOSIS — Z85828 Personal history of other malignant neoplasm of skin: Secondary | ICD-10-CM | POA: Diagnosis not present

## 2016-10-20 DIAGNOSIS — L821 Other seborrheic keratosis: Secondary | ICD-10-CM | POA: Diagnosis not present

## 2016-10-20 DIAGNOSIS — X32XXXA Exposure to sunlight, initial encounter: Secondary | ICD-10-CM | POA: Diagnosis not present

## 2016-10-20 DIAGNOSIS — Z08 Encounter for follow-up examination after completed treatment for malignant neoplasm: Secondary | ICD-10-CM | POA: Diagnosis not present

## 2016-10-20 DIAGNOSIS — L570 Actinic keratosis: Secondary | ICD-10-CM | POA: Diagnosis not present

## 2016-11-25 ENCOUNTER — Other Ambulatory Visit: Payer: Self-pay | Admitting: Family Medicine

## 2016-11-26 ENCOUNTER — Other Ambulatory Visit: Payer: Self-pay

## 2016-11-26 MED ORDER — LORAZEPAM 1 MG PO TABS: 1.0000 mg | ORAL_TABLET | Freq: Every evening | ORAL | 1 refills | Status: DC | PRN

## 2016-12-02 ENCOUNTER — Telehealth: Payer: Self-pay | Admitting: Family Medicine

## 2016-12-02 NOTE — Telephone Encounter (Signed)
Pt stated that he was taking LORazepam (ATIVAN) 2 MG tablet but [employer] advised him that the Rx we last sent in was for LORazepam (ATIVAN) 1 MG tablet. Pt is wanting to see if his dosage was changed or if it was an oversight. If it was an oversight pt would like a new Rx for the LORazepam (ATIVAN) 2 MG tablet to [facility] but if his dosage was changed he would be ok with that as long as the 1 mg works. Pt would like Elena to return his call. Please advise. Thanks TNP

## 2016-12-02 NOTE — Telephone Encounter (Signed)
LMTCB ED 

## 2016-12-03 NOTE — Telephone Encounter (Signed)
Patient reports that he is returning Elena's call. KW

## 2016-12-03 NOTE — Telephone Encounter (Signed)
FYI- Explained to the patient the dangers of long term use.  He questioned coming off of it all together.  I advised him the need to wean down on the medicine if he wants to come off of it.  He is going to start using 1/2 a pill of the 2 mg until he is out (he has about 10 pills).  Then when he gets the 1 mg pills he is going to take a whole one for another week or two and them might try decreasing to half a pill.  He will let us know how he does with this schedule. ED

## 2016-12-09 ENCOUNTER — Other Ambulatory Visit: Payer: Self-pay | Admitting: Family Medicine

## 2016-12-09 DIAGNOSIS — K219 Gastro-esophageal reflux disease without esophagitis: Secondary | ICD-10-CM

## 2016-12-15 DIAGNOSIS — H40153 Residual stage of open-angle glaucoma, bilateral: Secondary | ICD-10-CM | POA: Diagnosis not present

## 2016-12-24 DIAGNOSIS — H43813 Vitreous degeneration, bilateral: Secondary | ICD-10-CM | POA: Diagnosis not present

## 2017-01-13 ENCOUNTER — Other Ambulatory Visit: Payer: Self-pay | Admitting: Family Medicine

## 2017-03-11 DIAGNOSIS — G8929 Other chronic pain: Secondary | ICD-10-CM | POA: Diagnosis not present

## 2017-03-11 DIAGNOSIS — S39012D Strain of muscle, fascia and tendon of lower back, subsequent encounter: Secondary | ICD-10-CM | POA: Diagnosis not present

## 2017-03-11 DIAGNOSIS — Z96641 Presence of right artificial hip joint: Secondary | ICD-10-CM | POA: Diagnosis not present

## 2017-03-11 DIAGNOSIS — M5136 Other intervertebral disc degeneration, lumbar region: Secondary | ICD-10-CM | POA: Diagnosis not present

## 2017-03-11 DIAGNOSIS — M5441 Lumbago with sciatica, right side: Secondary | ICD-10-CM | POA: Diagnosis not present

## 2017-03-30 ENCOUNTER — Other Ambulatory Visit: Payer: Self-pay | Admitting: Family Medicine

## 2017-04-27 DIAGNOSIS — H40153 Residual stage of open-angle glaucoma, bilateral: Secondary | ICD-10-CM | POA: Diagnosis not present

## 2017-04-29 ENCOUNTER — Telehealth: Payer: Self-pay | Admitting: Family Medicine

## 2017-04-29 NOTE — Telephone Encounter (Signed)
ROI signed pt picked up records 02-12-17

## 2017-05-22 DIAGNOSIS — Z85828 Personal history of other malignant neoplasm of skin: Secondary | ICD-10-CM | POA: Diagnosis not present

## 2017-05-22 DIAGNOSIS — L821 Other seborrheic keratosis: Secondary | ICD-10-CM | POA: Diagnosis not present

## 2017-05-22 DIAGNOSIS — L570 Actinic keratosis: Secondary | ICD-10-CM | POA: Diagnosis not present

## 2017-05-22 DIAGNOSIS — C44329 Squamous cell carcinoma of skin of other parts of face: Secondary | ICD-10-CM | POA: Diagnosis not present

## 2017-05-22 DIAGNOSIS — D485 Neoplasm of uncertain behavior of skin: Secondary | ICD-10-CM | POA: Diagnosis not present

## 2017-05-22 DIAGNOSIS — Z08 Encounter for follow-up examination after completed treatment for malignant neoplasm: Secondary | ICD-10-CM | POA: Diagnosis not present

## 2017-05-22 DIAGNOSIS — X32XXXA Exposure to sunlight, initial encounter: Secondary | ICD-10-CM | POA: Diagnosis not present

## 2017-06-01 ENCOUNTER — Other Ambulatory Visit: Payer: Self-pay | Admitting: Family Medicine

## 2017-06-03 ENCOUNTER — Other Ambulatory Visit: Payer: Self-pay | Admitting: Cardiology

## 2017-06-10 ENCOUNTER — Other Ambulatory Visit: Payer: Self-pay | Admitting: Family Medicine

## 2017-06-17 DIAGNOSIS — C44329 Squamous cell carcinoma of skin of other parts of face: Secondary | ICD-10-CM | POA: Diagnosis not present

## 2017-06-17 DIAGNOSIS — D0439 Carcinoma in situ of skin of other parts of face: Secondary | ICD-10-CM | POA: Diagnosis not present

## 2017-08-03 NOTE — Progress Notes (Signed)
HPI: FU coronary artery disease status post coronary bypassing graft in May 1997. He also has a history of atrial flutter ablation. Previous abdominal ultrasound in May 2006 showed no significant renal artery stenosis and normal caliber aorta. Nuclear study 5/16 showed inferior ischemia and EF 61; we are treating medically. Since I last saw him, the patient denies any dyspnea on exertion, orthopnea, PND, pedal edema, palpitations, syncope or chest pain.   Current Outpatient Prescriptions  Medication Sig Dispense Refill  . amLODipine (NORVASC) 10 MG tablet TAKE 1 TABLET EVERY EVENING 90 tablet 3  . aspirin 81 MG tablet Take 81 mg by mouth daily.      . Cholecalciferol (VITAMIN D3) 2000 units TABS Take 1 tablet by mouth daily.    Marland Kitchen co-enzyme Q-10 30 MG capsule Take 30 mg by mouth daily.    . dorzolamide (TRUSOPT) 2 % ophthalmic solution 1 drop.     Marland Kitchen doxazosin (CARDURA) 2 MG tablet TAKE 1 TABLET EVERY DAY 90 tablet 3  . ibuprofen (ADVIL,MOTRIN) 200 MG tablet Take 200 mg by mouth daily as needed for moderate pain.    Marland Kitchen KLOR-CON M20 20 MEQ tablet TAKE 1 TABLET EVERY DAY 90 tablet 3  . labetalol (NORMODYNE) 200 MG tablet TAKE 1 TABLET TWICE DAILY 180 tablet 3  . latanoprost (XALATAN) 0.005 % ophthalmic solution 1 drop at bedtime.      Marland Kitchen loratadine (CLARITIN) 10 MG tablet Take 1 tablet (10 mg total) by mouth daily. 30 tablet 11  . LORazepam (ATIVAN) 1 MG tablet TAKE 1 TABLET AT BEDTIME AS NEEDED  FOR  SLEEP 90 tablet 1  . losartan-hydrochlorothiazide (HYZAAR) 100-25 MG tablet TAKE 1 TABLET EVERY DAY 90 tablet 3  . meclizine (ANTIVERT) 25 MG tablet Take 1 tablet by mouth daily as needed for dizziness.     . Omega-3 Fatty Acids (FISH OIL) 1000 MG CAPS Take by mouth daily.    Marland Kitchen omeprazole (PRILOSEC) 20 MG capsule TAKE 1 CAPSULE EVERY DAY 90 capsule 3  . ranitidine (ZANTAC) 150 MG tablet TAKE 1 TABLET AT BEDTIME 90 tablet 3  . rosuvastatin (CRESTOR) 20 MG tablet TAKE 1 TABLET EVERY DAY 90  tablet 3   No current facility-administered medications for this visit.      Past Medical History:  Diagnosis Date  . Anemia, unspecified   . Anxiety state, unspecified   . Coronary atherosclerosis   . GERD (gastroesophageal reflux disease)   . Osteoarthritis    group B streptococcal septic arthritis  . Unspecified essential hypertension     History reviewed. No pertinent surgical history.  Social History   Social History  . Marital status: Divorced    Spouse name: N/A  . Number of children: N/A  . Years of education: N/A   Occupational History  . Not on file.   Social History Main Topics  . Smoking status: Never Smoker  . Smokeless tobacco: Never Used  . Alcohol use Yes     Comment: drinks a beer occasionally  . Drug use: No  . Sexual activity: Not on file   Other Topics Concern  . Not on file   Social History Narrative  . No narrative on file    Family History  Problem Relation Age of Onset  . Parkinson's disease Mother   . Heart disease Mother   . Dementia Mother   . Cancer Father        lung cancer, smoker  . Depression Sister   .  Fibromyalgia Sister   . Arthritis Brother        B/L knee replacement  . Sleep apnea Brother     ROS: back pain but no fevers or chills, productive cough, hemoptysis, dysphasia, odynophagia, melena, hematochezia, dysuria, hematuria, rash, seizure activity, orthopnea, PND, pedal edema, claudication. Remaining systems are negative.  Physical Exam: Well-developed well-nourished in no acute distress.  Skin is warm and dry.  HEENT is normal.  Neck is supple.  Chest is clear to auscultation with normal expansion.  Cardiovascular exam is regular rate and rhythm.  Abdominal exam nontender or distended. No masses palpated. Extremities show no edema. neuro grossly intact  ECG- Sinus rhythm at a rate of 74. First degree AV block. personally reviewed  A/P  1 coronary artery disease status post coronary artery bypassing  graft-Pt is doing well from a symptomatic standpoint. Previous nuclear study not high risk. Continue medical therapy with aspirin and statin.   2 hypertension-blood pressure is controlled. Continue present medications. Check potassium and renal function.   3 hyperlipidemia-continue statin. Given documented coronary disease I will increase Crestor to 40 mg daily. Check lipids and liver in 4 weeks.  Kirk Ruths, MD

## 2017-08-11 ENCOUNTER — Ambulatory Visit (INDEPENDENT_AMBULATORY_CARE_PROVIDER_SITE_OTHER): Payer: Medicare HMO | Admitting: Cardiology

## 2017-08-11 ENCOUNTER — Encounter: Payer: Self-pay | Admitting: Cardiology

## 2017-08-11 VITALS — BP 132/52 | HR 74 | Ht 68.0 in | Wt 208.0 lb

## 2017-08-11 DIAGNOSIS — I10 Essential (primary) hypertension: Secondary | ICD-10-CM | POA: Diagnosis not present

## 2017-08-11 DIAGNOSIS — E7800 Pure hypercholesterolemia, unspecified: Secondary | ICD-10-CM

## 2017-08-11 DIAGNOSIS — I25810 Atherosclerosis of coronary artery bypass graft(s) without angina pectoris: Secondary | ICD-10-CM | POA: Diagnosis not present

## 2017-08-11 MED ORDER — ROSUVASTATIN CALCIUM 40 MG PO TABS: 40.0000 mg | ORAL_TABLET | Freq: Every day | ORAL | 3 refills | Status: DC

## 2017-08-11 NOTE — Patient Instructions (Signed)
Medication Instructions:   INCREASE ROSUVASTATIN TO 40 MG ONCE DAILY= 2 OF THE 20 MG TABLETS ONCE DAILY  Labwork:  Your physician recommends that you return for lab work in: [place]:  Your physician wants you to follow-up in: [place] will receive a reminder letter in the mail two months in advance. If you don't receive a letter, please call our office to schedule the follow-up appointment.   If you need a refill on your cardiac medications before your next appointment, please call your pharmacy.

## 2017-08-18 DIAGNOSIS — H40153 Residual stage of open-angle glaucoma, bilateral: Secondary | ICD-10-CM | POA: Diagnosis not present

## 2017-08-18 DIAGNOSIS — H524 Presbyopia: Secondary | ICD-10-CM | POA: Diagnosis not present

## 2017-08-19 ENCOUNTER — Encounter: Payer: Commercial Managed Care - HMO | Admitting: Family Medicine

## 2017-08-31 ENCOUNTER — Other Ambulatory Visit: Payer: Self-pay

## 2017-09-01 ENCOUNTER — Ambulatory Visit (INDEPENDENT_AMBULATORY_CARE_PROVIDER_SITE_OTHER): Payer: Medicare HMO

## 2017-09-01 ENCOUNTER — Other Ambulatory Visit: Payer: Self-pay

## 2017-09-01 ENCOUNTER — Ambulatory Visit (INDEPENDENT_AMBULATORY_CARE_PROVIDER_SITE_OTHER): Payer: Medicare HMO | Admitting: Family Medicine

## 2017-09-01 VITALS — BP 122/68 | HR 72 | Temp 97.9°F | Ht 68.0 in | Wt 213.0 lb

## 2017-09-01 VITALS — BP 122/68 | HR 72 | Temp 97.9°F | Ht 68.0 in | Wt 213.6 lb

## 2017-09-01 DIAGNOSIS — I10 Essential (primary) hypertension: Secondary | ICD-10-CM | POA: Diagnosis not present

## 2017-09-01 DIAGNOSIS — Z0000 Encounter for general adult medical examination without abnormal findings: Secondary | ICD-10-CM | POA: Diagnosis not present

## 2017-09-01 DIAGNOSIS — M791 Myalgia, unspecified site: Secondary | ICD-10-CM | POA: Diagnosis not present

## 2017-09-01 DIAGNOSIS — E785 Hyperlipidemia, unspecified: Secondary | ICD-10-CM

## 2017-09-01 DIAGNOSIS — Z1211 Encounter for screening for malignant neoplasm of colon: Secondary | ICD-10-CM

## 2017-09-01 LAB — IFOBT (OCCULT BLOOD): IFOBT: NEGATIVE

## 2017-09-01 NOTE — Progress Notes (Signed)
Patient: [name], Male    DOB: Sep 22, 1933, 83 y.o.   MRN: [id number] Visit Date: 09/01/2017  Today's Provider: [name], MD   Chief Complaint  Patient presents with  . Annual Exam   Subjective:   [name] is a 83 y.o. male who presents today for his Subsequent Annual Wellness Visit. He feels well. He reports exercising 2-3 timew weekly. He reports he is sleeping well.  08/21/11 Colonoscopy-diverticulosis, internal hemorrhoids, no further screening colonoscopy needed.  Immunization History  Administered Date(s) Administered  . Influenza, High Dose Seasonal PF 08/09/2015, 07/14/2016, 07/23/2017  . Influenza-Unspecified 07/13/2013  . Pneumococcal Conjugate-13 04/11/2014  . Pneumococcal Polysaccharide-23 08/19/2006  . Td 05/01/2005  . Tdap 06/12/2015  . Zoster 02/05/2011     Review of Systems  Constitutional: Positive for fatigue.  HENT: Positive for ear discharge.   Eyes: Negative.   Respiratory: Negative.   Cardiovascular: Negative.   Gastrointestinal: Negative.   Endocrine: Cold intolerance: patient thinks this is secondary to taking blood thinner.  Genitourinary: Negative.   Skin: Negative.   Allergic/Immunologic: Negative.   Neurological: Negative.   Hematological: Negative.   Psychiatric/Behavioral: Negative.     Patient Active Problem List   Diagnosis Date Noted  . Arthropathy of hand 06/11/2015  . Arteriosclerosis of coronary artery 06/11/2015  . Impotence of organic origin 06/11/2015  . Generalized anxiety disorder 06/11/2015  . Acid reflux 06/11/2015  . History of other malignant neoplasm of skin 06/11/2015  . HLD (hyperlipidemia) 06/11/2015  . Arthritis, degenerative 06/11/2015  . Adiposity 06/11/2015  . Venous insufficiency of leg 06/11/2015  . HYPERLIPIDEMIA 08/16/2009  . ANEMIA 08/16/2009  . ANXIETY 08/16/2009  . GERD 08/16/2009  . OSTEOARTHRITIS 08/16/2009  . Umbilical hernia without obstruction and without gangrene 03/28/2008  .  Essential hypertension 10/20/2006  . Coronary atherosclerosis 10/20/2006  . CARDIAC ARRHYTHMIA 10/20/2006  . SEPTIC ARTHRITIS 10/20/2006    Social History   Socioeconomic History  . Marital status: Divorced    Spouse name: Not on file  . Number of children: Not on file  . Years of education: Not on file  . Highest education level: Not on file  Social Needs  . Financial resource strain: Not on file  . Food insecurity - worry: Not on file  . Food insecurity - inability: Not on file  . Transportation needs - medical: Not on file  . Transportation needs - non-medical: Not on file  Occupational History  . Not on file  Tobacco Use  . Smoking status: Never Smoker  . Smokeless tobacco: Never Used  Substance and Sexual Activity  . Alcohol use: Yes    Comment: drinks a beer occasionally  . Drug use: No  . Sexual activity: Not on file  Other Topics Concern  . Not on file  Social History Narrative  . Not on file    No past surgical history on file.  His family history includes Arthritis in his brother; Cancer in his father; Dementia in his mother; Depression in his sister; Fibromyalgia in his sister; Heart disease in his mother; Parkinson's disease in his mother; Sleep apnea in his brother.     Outpatient Encounter Medications as of 09/01/2017  Medication Sig  . amLODipine (NORVASC) 10 MG tablet TAKE 1 TABLET EVERY EVENING  . aspirin 81 MG tablet Take 81 mg by mouth daily.    . Cholecalciferol (VITAMIN D3) 2000 units TABS Take 1 tablet by mouth daily.  Marland Kitchen co-enzyme Q-10 30 MG capsule Take 30  mg by mouth daily.  . dorzolamide (TRUSOPT) 2 % ophthalmic solution Place 1 drop 2 (two) times daily into both eyes.   Marland Kitchen doxazosin (CARDURA) 2 MG tablet TAKE 1 TABLET EVERY DAY  . FLUZONE HIGH-DOSE 0.5 ML injection   . ibuprofen (ADVIL,MOTRIN) 200 MG tablet Take 200 mg by mouth daily as needed for moderate pain.  Marland Kitchen KLOR-CON M20 20 MEQ tablet TAKE 1 TABLET EVERY DAY  . labetalol (NORMODYNE)  200 MG tablet TAKE 1 TABLET TWICE DAILY  . latanoprost (XALATAN) 0.005 % ophthalmic solution 1 drop at bedtime.    Marland Kitchen loratadine (CLARITIN) 10 MG tablet Take 1 tablet (10 mg total) by mouth daily.  Marland Kitchen LORazepam (ATIVAN) 1 MG tablet TAKE 1 TABLET AT BEDTIME AS NEEDED  FOR  SLEEP  . losartan-hydrochlorothiazide (HYZAAR) 100-25 MG tablet TAKE 1 TABLET EVERY DAY  . meclizine (ANTIVERT) 25 MG tablet Take 1 tablet by mouth daily as needed for dizziness.   . Omega-3 Fatty Acids (FISH OIL) 1000 MG CAPS Take by mouth daily.  Marland Kitchen omeprazole (PRILOSEC) 20 MG capsule TAKE 1 CAPSULE EVERY DAY  . ranitidine (ZANTAC) 150 MG tablet TAKE 1 TABLET AT BEDTIME  . rosuvastatin (CRESTOR) 40 MG tablet Take 1 tablet (40 mg total) by mouth daily.   No facility-administered encounter medications on file as of 09/01/2017.     No Known Allergies  Patient Care Team: Jerrol Banana., MD as PCP - General (Unknown Physician Specialty) Stanford Breed, Denice Bors, MD (Cardiology) Lorelee Cover., MD as Consulting Physician (Ophthalmology)   Objective:   Vitals:  Vitals:   09/01/17 0932  BP: 122/68  Pulse: 72  Temp: 97.9 F (36.6 C)  TempSrc: Oral  Weight: 213 lb (96.6 kg)  Height: 5\' 8"  (1.727 m)    Physical Exam  Constitutional: He is oriented to person, place, and time. He appears well-developed and well-nourished.  HENT:  Head: Normocephalic and atraumatic.  Right Ear: External ear normal.  Left Ear: External ear normal.  Nose: Nose normal.  Mouth/Throat: Oropharynx is clear and moist.  Eyes: Conjunctivae and EOM are normal. Pupils are equal, round, and reactive to light.  Neck: Normal range of motion. Neck supple.  Cardiovascular: Normal rate, regular rhythm, normal heart sounds and intact distal pulses.  Pulmonary/Chest: Effort normal and breath sounds normal.  Abdominal: Soft.  Genitourinary: Rectum normal, prostate normal and penis normal.  Musculoskeletal: Normal range of motion.  Neurological: He  is alert and oriented to person, place, and time.  Skin: Skin is warm and dry.  Many SKs on trunk.  Psychiatric: He has a normal mood and affect. His behavior is normal. Judgment and thought content normal.    Activities of Daily Living In your present state of health, do you have any difficulty performing the following activities: 09/01/2017  Hearing? Y  Comment has bilateral hearing aids, only wears right ear hearing aid  Vision? N  Difficulty concentrating or making decisions? N  Walking or climbing stairs? N  Dressing or bathing? N  Doing errands, shopping? N  Preparing Food and eating ? N  Using the Toilet? N  In the past six months, have you accidently leaked urine? N  Do you have problems with loss of bowel control? N  Managing your Medications? N  Managing your Finances? N  Housekeeping or managing your Housekeeping? N  Some recent data might be hidden    Fall Risk Assessment Fall Risk  09/01/2017 08/06/2016 06/11/2015  Falls in the past year?  Yes No No  Number falls in past yr: 1 - -  Comment slipped in rain - -  Injury with Fall? No - -  Follow up Falls prevention discussed - -     Depression Screen PHQ 2/9 Scores 09/01/2017 08/06/2016 06/11/2015  PHQ - 2 Score 2 0 0  PHQ- 9 Score 4 - -    6CIT Screen 09/01/2017 08/06/2016  What Year? 0 points 0 points  What month? 0 points 0 points  What time? 0 points 0 points  Count back from 20 0 points 0 points  Months in reverse 0 points 0 points  Repeat phrase 2 points 0 points  Total Score 2 0      Home Exercise  09/01/2017 08/06/2016  Current Exercise Habits Structured exercise class Home exercise routine  Type of exercise Other - see comments;strength training/weights treadmill;strength training/weights  Time (Minutes) 30 25  Frequency (Times/Week) 2 3  Weekly Exercise (Minutes/Week) 60 75  Intensity Mild Mild  Exercise limited by: orthopedic condition(s) -      Assessment & Plan:     Annual Wellness  Visit  Reviewed patient's Family Medical History Reviewed and updated list of patient's medical providers Assessment of cognitive impairment was done Assessed patient's functional ability Established a written schedule for health screening [place] Completed and Reviewed  Exercise Activities and Dietary recommendations Goals    . DIET - INCREASE WATER INTAKE     Recommend increasing water intake to 6 glasses a day.        Immunization History  Administered Date(s) Administered  . Influenza, High Dose Seasonal PF 08/09/2015, 07/14/2016, 07/23/2017  . Influenza-Unspecified 07/13/2013  . Pneumococcal Conjugate-13 04/11/2014  . Pneumococcal Polysaccharide-23 08/19/2006  . Td 05/01/2005  . Tdap 06/12/2015  . Zoster 02/05/2011    Health Maintenance  Topic Date Due  . TETANUS/TDAP  06/11/2025  . INFLUENZA VACCINE  Completed  . PNA vac Low Risk Adult  Completed     Discussed health benefits of physical activity, and encouraged him to engage in regular exercise appropriate for his age and condition.  CAD HTN HLD Myalgia SKs  I have done the exam and reviewed the chart and it is accurate to the best of my knowledge. Development worker, community has been used and  any errors in dictation or transcription are unintentional. Miguel Aschoff M.D. [facility]

## 2017-09-01 NOTE — Patient Instructions (Addendum)
[name] , Thank you for taking time to come for your Medicare Wellness Visit. I appreciate your ongoing commitment to your health goals. Please review the following plan we discussed and let me know if I can assist you in the future.   Screening recommendations/referrals: Colonoscopy: Up to date Recommended yearly ophthalmology/optometry visit for glaucoma screening and checkup Recommended yearly dental visit for hygiene and checkup  Vaccinations: Influenza vaccine: completed Pneumococcal vaccine: completed Tdap vaccine: up to date Shingles vaccine: completed  Advanced directives: Please bring a copy of your POA (Power of Attorney) and/or Living Will to your next appointment.   Conditions/risks identified: Fall risk prevention; Obesity- recommend increasing water intake to 6 glasses a day.   Next appointment: 9:30 AM today  Preventive Care 68 Years and Older, Male Preventive care refers to lifestyle choices and visits with your health care provider that can promote health and wellness. What does preventive care include?  A yearly physical exam. This is also called an annual well check.  Dental exams once or twice a year.  Routine eye exams. Ask your health care provider how often you should have your eyes checked.  Personal lifestyle choices, including:  Daily care of your teeth and gums.  Regular physical activity.  Eating a healthy diet.  Avoiding tobacco and drug use.  Limiting alcohol use.  Practicing safe sex.  Taking low doses of aspirin every day.  Taking vitamin and mineral supplements as recommended by your health care provider. What happens during an annual well check? The services and screenings done by your health care provider during your annual well check will depend on your age, overall health, lifestyle risk factors, and family history of disease. Counseling  Your health care provider may ask you questions about your:  Alcohol use.  Tobacco  use.  Drug use.  Emotional well-being.  Home and relationship well-being.  Sexual activity.  Eating habits.  History of falls.  Memory and ability to understand (cognition).  Work and work Statistician. Screening  You may have the following tests or measurements:  Height, weight, and BMI.  Blood pressure.  Lipid and cholesterol levels. These may be checked every 5 years, or more frequently if you are over 23 years old.  Skin check.  Lung cancer screening. You may have this screening every year starting at age 63 if you have a 30-pack-year history of smoking and currently smoke or have quit within the past 15 years.  Fecal occult blood test (FOBT) of the stool. You may have this test every year starting at age 38.  Flexible sigmoidoscopy or colonoscopy. You may have a sigmoidoscopy every 5 years or a colonoscopy every 10 years starting at age 62.  Prostate cancer screening. Recommendations will vary depending on your family history and other risks.  Hepatitis C blood test.  Hepatitis B blood test.  Sexually transmitted disease (STD) testing.  Diabetes screening. This is done by checking your blood sugar (glucose) after you have not eaten for a while (fasting). You may have this done every 1-3 years.  Abdominal aortic aneurysm (AAA) screening. You may need this if you are a current or former smoker.  Osteoporosis. You may be screened starting at age 47 if you are at high risk. Talk with your health care provider about your test results, treatment options, and if necessary, the need for more tests. Vaccines  Your health care provider may recommend certain vaccines, such as:  Influenza vaccine. This is recommended every year.  Tetanus,  diphtheria, and acellular pertussis (Tdap, Td) vaccine. You may need a Td booster every 10 years.  Zoster vaccine. You may need this after age 37.  Pneumococcal 13-valent conjugate (PCV13) vaccine. One dose is recommended after age  61.  Pneumococcal polysaccharide (PPSV23) vaccine. One dose is recommended after age 79. Talk to your health care provider about which screenings and vaccines you need and how often you need them. This information is not intended to replace advice given to you by your health care provider. Make sure you discuss any questions you have with your health care provider. Document Released: 10/26/2015 Document Revised: 06/18/2016 Document Reviewed: 07/31/2015 Elsevier Interactive Patient Education  2017 [place] Prevention in the Home Falls can cause injuries. They can happen to people of all ages. There are many things you can do to make your home safe and to help prevent falls. What can I do on the outside of my home?  Regularly fix the edges of walkways and driveways and fix any cracks.  Remove anything that might make you trip as you walk through a door, such as a raised step or threshold.  Trim any bushes or trees on the path to your home.  Use bright outdoor lighting.  Clear any walking paths of anything that might make someone trip, such as rocks or tools.  Regularly check to see if handrails are loose or broken. Make sure that both sides of any steps have handrails.  Any raised decks and porches should have guardrails on the edges.  Have any leaves, snow, or ice cleared regularly.  Use sand or salt on walking paths during winter.  Clean up any spills in your garage right away. This includes oil or grease spills. What can I do in the bathroom?  Use night lights.  Install grab bars by the toilet and in the tub and shower. Do not use towel bars as grab bars.  Use non-skid mats or decals in the tub or shower.  If you need to sit down in the shower, use a plastic, non-slip stool.  Keep the floor dry. Clean up any water that spills on the floor as soon as it happens.  Remove soap buildup in the tub or shower regularly.  Attach bath mats securely with double-sided  non-slip rug tape.  Do not have throw rugs and other things on the floor that can make you trip. What can I do in the bedroom?  Use night lights.  Make sure that you have a light by your bed that is easy to reach.  Do not use any sheets or blankets that are too big for your bed. They should not hang down onto the floor.  Have a firm chair that has side arms. You can use this for support while you get dressed.  Do not have throw rugs and other things on the floor that can make you trip. What can I do in the kitchen?  Clean up any spills right away.  Avoid walking on wet floors.  Keep items that you use a lot in easy-to-reach places.  If you need to reach something above you, use a strong step stool that has a grab bar.  Keep electrical cords out of the way.  Do not use floor polish or wax that makes floors slippery. If you must use wax, use non-skid floor wax.  Do not have throw rugs and other things on the floor that can make you trip. What can I do with  my stairs?  Do not leave any items on the stairs.  Make sure that there are handrails on both sides of the stairs and use them. Fix handrails that are broken or loose. Make sure that handrails are as long as the stairways.  Check any carpeting to make sure that it is firmly attached to the stairs. Fix any carpet that is loose or worn.  Avoid having throw rugs at the top or bottom of the stairs. If you do have throw rugs, attach them to the floor with carpet tape.  Make sure that you have a light switch at the top of the stairs and the bottom of the stairs. If you do not have them, ask someone to add them for you. What else can I do to help prevent falls?  Wear shoes that:  Do not have high heels.  Have rubber bottoms.  Are comfortable and fit you well.  Are closed at the toe. Do not wear sandals.  If you use a stepladder:  Make sure that it is fully opened. Do not climb a closed stepladder.  Make sure that both  sides of the stepladder are locked into place.  Ask someone to hold it for you, if possible.  Clearly mark and make sure that you can see:  Any grab bars or handrails.  First and last steps.  Where the edge of each step is.  Use tools that help you move around (mobility aids) if they are needed. These include:  Canes.  Walkers.  Scooters.  Crutches.  Turn on the lights when you go into a dark area. Replace any light bulbs as soon as they burn out.  Set up your furniture so you have a clear path. Avoid moving your furniture around.  If any of your floors are uneven, fix them.  If there are any pets around you, be aware of where they are.  Review your medicines with your doctor. Some medicines can make you feel dizzy. This can increase your chance of falling. Ask your doctor what other things that you can do to help prevent falls. This information is not intended to replace advice given to you by your health care provider. Make sure you discuss any questions you have with your health care provider. Document Released: 07/26/2009 Document Revised: 03/06/2016 Document Reviewed: 11/03/2014 Elsevier Interactive Patient Education  2017 [employer].

## 2017-09-01 NOTE — Progress Notes (Signed)
Subjective:   [name] is a 83 y.o. male who presents for Medicare Annual/Subsequent preventive examination.  Review of Systems:  N/A  Cardiac Risk Factors include: advanced age (>23men, >67 women);dyslipidemia;hypertension;male gender;obesity (BMI >30kg/m2)     Objective:    Vitals: BP 122/68 (BP Location: Left Arm)   Pulse 72   Temp 97.9 F (36.6 C) (Oral)   Ht 5\' 8"  (1.727 m)   Wt 213 lb 9.6 oz (96.9 kg)   BMI 32.48 kg/m   Body mass index is 32.48 kg/m.  Tobacco Social History   Tobacco Use  Smoking Status Never Smoker  Smokeless Tobacco Never Used     Counseling given: Not Answered   Past Medical History:  Diagnosis Date  . Anemia, unspecified   . Anxiety state, unspecified   . Coronary atherosclerosis   . GERD (gastroesophageal reflux disease)   . Osteoarthritis    group B streptococcal septic arthritis  . Unspecified essential hypertension    History reviewed. No pertinent surgical history. Family History  Problem Relation Age of Onset  . Parkinson's disease Mother   . Heart disease Mother   . Dementia Mother   . Cancer Father        lung cancer, smoker  . Depression Sister   . Fibromyalgia Sister   . Arthritis Brother        B/L knee replacement  . Sleep apnea Brother    Social History   Substance and Sexual Activity  Sexual Activity Not on file    Outpatient Encounter Medications as of 09/01/2017  Medication Sig  . amLODipine (NORVASC) 10 MG tablet TAKE 1 TABLET EVERY EVENING  . aspirin 81 MG tablet Take 81 mg by mouth daily.    . Cholecalciferol (VITAMIN D3) 2000 units TABS Take 1 tablet by mouth daily.  Marland Kitchen co-enzyme Q-10 30 MG capsule Take 30 mg by mouth daily.  . dorzolamide (TRUSOPT) 2 % ophthalmic solution Place 1 drop 2 (two) times daily into both eyes.   Marland Kitchen doxazosin (CARDURA) 2 MG tablet TAKE 1 TABLET EVERY DAY  . FLUZONE HIGH-DOSE 0.5 ML injection   . ibuprofen (ADVIL,MOTRIN) 200 MG tablet Take 200 mg by mouth daily as  needed for moderate pain.  Marland Kitchen KLOR-CON M20 20 MEQ tablet TAKE 1 TABLET EVERY DAY  . labetalol (NORMODYNE) 200 MG tablet TAKE 1 TABLET TWICE DAILY  . latanoprost (XALATAN) 0.005 % ophthalmic solution 1 drop at bedtime.    Marland Kitchen loratadine (CLARITIN) 10 MG tablet Take 1 tablet (10 mg total) by mouth daily.  Marland Kitchen LORazepam (ATIVAN) 1 MG tablet TAKE 1 TABLET AT BEDTIME AS NEEDED  FOR  SLEEP  . losartan-hydrochlorothiazide (HYZAAR) 100-25 MG tablet TAKE 1 TABLET EVERY DAY  . meclizine (ANTIVERT) 25 MG tablet Take 1 tablet by mouth daily as needed for dizziness.   . Omega-3 Fatty Acids (FISH OIL) 1000 MG CAPS Take by mouth daily.  Marland Kitchen omeprazole (PRILOSEC) 20 MG capsule TAKE 1 CAPSULE EVERY DAY  . ranitidine (ZANTAC) 150 MG tablet TAKE 1 TABLET AT BEDTIME  . rosuvastatin (CRESTOR) 40 MG tablet Take 1 tablet (40 mg total) by mouth daily.   No facility-administered encounter medications on file as of 09/01/2017.     Activities of Daily Living In your present state of health, do you have any difficulty performing the following activities: 09/01/2017  Hearing? Y  Comment has bilateral hearing aids, only wears right ear hearing aid  Vision? N  Difficulty concentrating or making decisions? N  Walking or climbing stairs? N  Dressing or bathing? N  Doing errands, shopping? N  Preparing Food and eating ? N  Using the Toilet? N  In the past six months, have you accidently leaked urine? N  Do you have problems with loss of bowel control? N  Managing your Medications? N  Managing your Finances? N  Housekeeping or managing your Housekeeping? N  Some recent data might be hidden    Patient Care Team: Jerrol Banana., MD as PCP - General (Unknown Physician Specialty) Stanford Breed Denice Bors, MD (Cardiology) Lorelee Cover., MD as Consulting Physician (Ophthalmology)   Assessment:     Exercise Activities and Dietary recommendations Current Exercise Habits: Structured exercise class, Type of exercise:  Other - see comments;strength training/weights(rides a stationary bicycle), Time (Minutes): 30, Frequency (Times/Week): 2(to 3 days a week), Weekly Exercise (Minutes/Week): 60, Intensity: Mild, Exercise limited by: orthopedic condition(s)(has back pain)  Goals    . DIET - INCREASE WATER INTAKE     Recommend increasing water intake to 6 glasses a day.       Fall Risk Fall Risk  09/01/2017 08/06/2016 06/11/2015  Falls in the past year? Yes No No  Number falls in past yr: 1 - -  Comment slipped in rain - -  Injury with Fall? No - -  Follow up Falls prevention discussed - -   Depression Screen PHQ 2/9 Scores 09/01/2017 08/06/2016 06/11/2015  PHQ - 2 Score 2 0 0  PHQ- 9 Score 4 - -    Cognitive Function     6CIT Screen 09/01/2017 08/06/2016  What Year? 0 points 0 points  What month? 0 points 0 points  What time? 0 points 0 points  Count back from 20 0 points 0 points  Months in reverse 0 points 0 points  Repeat phrase 2 points 0 points  Total Score 2 0    Immunization History  Administered Date(s) Administered  . Influenza, High Dose Seasonal PF 08/09/2015, 07/14/2016, 07/23/2017  . Influenza-Unspecified 07/13/2013  . Pneumococcal Conjugate-13 04/11/2014  . Pneumococcal Polysaccharide-23 08/19/2006  . Td 05/01/2005  . Tdap 06/12/2015  . Zoster 02/05/2011   Screening Tests Health Maintenance  Topic Date Due  . TETANUS/TDAP  06/11/2025  . INFLUENZA VACCINE  Completed  . PNA vac Low Risk Adult  Completed      Plan:  I have personally reviewed and addressed the Medicare Annual Wellness questionnaire and have noted the following in the patient's chart:  A. Medical and social history B. Use of alcohol, tobacco or illicit drugs  C. Current medications and supplements D. Functional ability and status E.  Nutritional status F.  Physical activity G. Advance directives H. List of other physicians I.  Hospitalizations, surgeries, and ER visits in previous 12 months J.    [place] such as hearing and vision if needed, cognitive and depression L. Referrals and appointments - none  In addition, I have reviewed and discussed with patient certain preventive protocols, quality metrics, and best practice recommendations. A written personalized care plan for preventive services as well as general preventive health recommendations were provided to patient.  See attached scanned questionnaire for additional information.   Signed,  Fabio Neighbors, LPN Nurse Health Advisor   MD Recommendations: None.

## 2017-09-02 ENCOUNTER — Other Ambulatory Visit: Payer: Self-pay | Admitting: Family Medicine

## 2017-09-02 DIAGNOSIS — I10 Essential (primary) hypertension: Secondary | ICD-10-CM | POA: Diagnosis not present

## 2017-09-02 DIAGNOSIS — K219 Gastro-esophageal reflux disease without esophagitis: Secondary | ICD-10-CM

## 2017-09-02 DIAGNOSIS — E785 Hyperlipidemia, unspecified: Secondary | ICD-10-CM | POA: Diagnosis not present

## 2017-09-02 DIAGNOSIS — M791 Myalgia, unspecified site: Secondary | ICD-10-CM | POA: Diagnosis not present

## 2017-09-02 LAB — COMPLETE METABOLIC PANEL WITH GFR
AG RATIO: 1.8 (calc) (ref 1.0–2.5)
ALBUMIN MSPROF: 4.2 g/dL (ref 3.6–5.1)
ALT: 19 U/L (ref 9–46)
AST: 16 U/L (ref 10–35)
Alkaline phosphatase (APISO): 65 U/L (ref 40–115)
BILIRUBIN TOTAL: 0.5 mg/dL (ref 0.2–1.2)
BUN / CREAT RATIO: 17 (calc) (ref 6–22)
BUN: 23 mg/dL (ref 7–25)
CHLORIDE: 106 mmol/L (ref 98–110)
CO2: 26 mmol/L (ref 20–32)
Calcium: 9.6 mg/dL (ref 8.6–10.3)
Creat: 1.32 mg/dL — ABNORMAL HIGH (ref 0.70–1.11)
GFR, EST AFRICAN AMERICAN: 58 mL/min/{1.73_m2} — AB (ref >=60)
GFR, EST NON AFRICAN AMERICAN: 50 mL/min/{1.73_m2} — AB (ref >=60)
GLOBULIN: 2.3 g/dL (ref 1.9–3.7)
Glucose, Bld: 106 mg/dL — ABNORMAL HIGH (ref 65–99)
POTASSIUM: 3.9 mmol/L (ref 3.5–5.3)
Sodium: 141 mmol/L (ref 135–146)
TOTAL PROTEIN: 6.5 g/dL (ref 6.1–8.1)

## 2017-09-02 LAB — CBC WITH DIFFERENTIAL/PLATELET
BASOS ABS: 39 {cells}/uL (ref 0–200)
Basophils Relative: 0.8 %
EOS ABS: 274 {cells}/uL (ref 15–500)
EOS PCT: 5.6 %
HCT: 40.6 % (ref 38.5–50.0)
Hemoglobin: 14.4 g/dL (ref 13.2–17.1)
Lymphs Abs: 828 cells/uL — ABNORMAL LOW (ref 850–3900)
MCH: 31.6 pg (ref 27.0–33.0)
MCHC: 35.5 g/dL (ref 32.0–36.0)
MCV: 89.2 fL (ref 80.0–100.0)
MONOS PCT: 10.3 %
MPV: 8.7 fL (ref 7.5–12.5)
NEUTROS PCT: 66.4 %
Neutro Abs: 3254 cells/uL (ref 1500–7800)
Platelets: 194 10*3/uL (ref 140–400)
RBC: 4.55 10*6/uL (ref 4.20–5.80)
RDW: 13.2 % (ref 11.0–15.0)
Total Lymphocyte: 16.9 %
WBC mixed population: 505 cells/uL (ref 200–950)
WBC: 4.9 10*3/uL (ref 3.8–10.8)

## 2017-09-02 LAB — CK: Total CK: 176 U/L (ref 44–196)

## 2017-09-02 LAB — LIPID PANEL
CHOLESTEROL: 102 mg/dL (ref <200)
HDL: 43 mg/dL (ref >40)
LDL Cholesterol (Calc): 40 mg/dL (calc)
NON-HDL CHOLESTEROL (CALC): 59 mg/dL (ref <130)
Total CHOL/HDL Ratio: 2.4 (calc) (ref <5.0)
Triglycerides: 106 mg/dL (ref <150)

## 2017-09-02 LAB — TSH: TSH: 2.26 mIU/L (ref 0.40–4.50)

## 2017-09-10 ENCOUNTER — Encounter: Payer: Commercial Managed Care - HMO | Admitting: Family Medicine

## 2017-09-16 DIAGNOSIS — Z85828 Personal history of other malignant neoplasm of skin: Secondary | ICD-10-CM | POA: Diagnosis not present

## 2017-09-16 DIAGNOSIS — D0439 Carcinoma in situ of skin of other parts of face: Secondary | ICD-10-CM | POA: Diagnosis not present

## 2017-09-16 DIAGNOSIS — D485 Neoplasm of uncertain behavior of skin: Secondary | ICD-10-CM | POA: Diagnosis not present

## 2017-09-16 DIAGNOSIS — L821 Other seborrheic keratosis: Secondary | ICD-10-CM | POA: Diagnosis not present

## 2017-09-16 DIAGNOSIS — Z08 Encounter for follow-up examination after completed treatment for malignant neoplasm: Secondary | ICD-10-CM | POA: Diagnosis not present

## 2017-09-16 DIAGNOSIS — X32XXXA Exposure to sunlight, initial encounter: Secondary | ICD-10-CM | POA: Diagnosis not present

## 2017-09-16 DIAGNOSIS — L570 Actinic keratosis: Secondary | ICD-10-CM | POA: Diagnosis not present

## 2017-09-17 ENCOUNTER — Telehealth: Payer: Self-pay | Admitting: Family Medicine

## 2017-09-17 NOTE — Telephone Encounter (Signed)
Pt is requesting his lab results.  FT#[phone]/[id number]

## 2017-09-18 NOTE — Telephone Encounter (Signed)
LMTCB

## 2017-09-18 NOTE — Telephone Encounter (Signed)
Labs good

## 2017-09-22 NOTE — Telephone Encounter (Signed)
Pt was advised last week-Anastasiya Estell Harpin, RMA

## 2017-10-01 ENCOUNTER — Ambulatory Visit: Payer: Medicare HMO | Admitting: Family Medicine

## 2017-10-01 ENCOUNTER — Encounter: Payer: Self-pay | Admitting: Family Medicine

## 2017-10-01 VITALS — BP 124/66 | HR 78 | Temp 97.9°F | Resp 16 | Wt 211.8 lb

## 2017-10-01 DIAGNOSIS — H6122 Impacted cerumen, left ear: Secondary | ICD-10-CM

## 2017-10-01 DIAGNOSIS — R42 Dizziness and giddiness: Secondary | ICD-10-CM

## 2017-10-01 NOTE — Progress Notes (Addendum)
Subjective:     Patient ID: [name], male   DOB: [birth year], 83 y.o.   MRN: [id number] Chief Complaint  Patient presents with  . Dizziness    Patient comes in office today with complaints of feeling off balance for the past week. Patient states that he started taking Meclizine for dizziness but it has not helped with symptoms, he walks with cane now for stability.    HPI States he has been having brief spells of vertigo of 20-30 minutes duration. States last occurred when he got up to go to the bathroom at 4 AM.. States the spinning started when he sat on the side of the bed. Reports he lay back down and his symptoms abated after 30 minutes. Not dizzy on presentation today. Reports prior hx of vertigo with ENT evaluation approximately 4 years ago.  Review of Systems     Objective:   Physical Exam  Constitutional: He appears well-developed and well-nourished. No distress.  HENT:  Left ear canal occluded by cerumen. Right ear canal patent After irrigation per Wendelyn Breslow patient states his ear feels better  Eyes: EOM are normal.  Pupils equal and aphakic  Neck: Carotid bruit is not present.  Cardiovascular: Normal rate and regular rhythm.  Pulmonary/Chest: Breath sounds normal.  Musculoskeletal:  Grip strength 5/5  Neurological: Coordination (Finger to nose WNL, Romberg negative) normal.       Assessment:    1. Vertigo: continue meclizine otc 3 x day - Ambulatory referral to ENT  2. Obstruction of ventilation tube of left ear by cerumen - EAR CERUMEN REMOVAL    Plan:    Further f/u pending ENT referral. Patient ,may cancer if sx resolve.

## 2017-10-01 NOTE — Patient Instructions (Signed)
Continue Meclizine 3 x day. We call with the ENT referral.

## 2017-10-03 ENCOUNTER — Other Ambulatory Visit: Payer: Self-pay

## 2017-10-03 ENCOUNTER — Ambulatory Visit (INDEPENDENT_AMBULATORY_CARE_PROVIDER_SITE_OTHER): Payer: Medicare HMO | Admitting: Family Medicine

## 2017-10-03 VITALS — BP 138/80 | HR 74 | Temp 97.6°F | Resp 16 | Wt 208.0 lb

## 2017-10-03 DIAGNOSIS — R42 Dizziness and giddiness: Secondary | ICD-10-CM | POA: Diagnosis not present

## 2017-10-03 MED ORDER — PREDNISONE 10 MG PO TABS: 10.0000 mg | ORAL_TABLET | Freq: Every day | ORAL | 0 refills | Status: DC
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
## 2017-10-27 DIAGNOSIS — R42 Dizziness and giddiness: Secondary | ICD-10-CM | POA: Diagnosis not present

## 2017-10-27 DIAGNOSIS — H6121 Impacted cerumen, right ear: Secondary | ICD-10-CM | POA: Diagnosis not present

## 2017-10-29 ENCOUNTER — Other Ambulatory Visit: Payer: Self-pay | Admitting: Family Medicine

## 2017-11-03 DIAGNOSIS — H90A22 Sensorineural hearing loss, unilateral, left ear, with restricted hearing on the contralateral side: Secondary | ICD-10-CM | POA: Diagnosis not present

## 2017-11-03 DIAGNOSIS — R42 Dizziness and giddiness: Secondary | ICD-10-CM | POA: Diagnosis not present

## 2017-11-04 NOTE — Telephone Encounter (Signed)
Error

## 2017-11-09 DIAGNOSIS — C44329 Squamous cell carcinoma of skin of other parts of face: Secondary | ICD-10-CM | POA: Diagnosis not present

## 2017-11-09 DIAGNOSIS — D0439 Carcinoma in situ of skin of other parts of face: Secondary | ICD-10-CM | POA: Diagnosis not present

## 2017-11-18 DIAGNOSIS — R42 Dizziness and giddiness: Secondary | ICD-10-CM | POA: Diagnosis not present

## 2017-11-25 DIAGNOSIS — R42 Dizziness and giddiness: Secondary | ICD-10-CM | POA: Diagnosis not present

## 2017-12-15 DIAGNOSIS — H40153 Residual stage of open-angle glaucoma, bilateral: Secondary | ICD-10-CM | POA: Diagnosis not present

## 2017-12-18 ENCOUNTER — Other Ambulatory Visit: Payer: Self-pay | Admitting: Family Medicine

## 2017-12-24 ENCOUNTER — Other Ambulatory Visit: Payer: Self-pay

## 2018-02-02 DIAGNOSIS — D2261 Melanocytic nevi of right upper limb, including shoulder: Secondary | ICD-10-CM | POA: Diagnosis not present

## 2018-02-02 DIAGNOSIS — L538 Other specified erythematous conditions: Secondary | ICD-10-CM | POA: Diagnosis not present

## 2018-02-02 DIAGNOSIS — L820 Inflamed seborrheic keratosis: Secondary | ICD-10-CM | POA: Diagnosis not present

## 2018-02-02 DIAGNOSIS — D2272 Melanocytic nevi of left lower limb, including hip: Secondary | ICD-10-CM | POA: Diagnosis not present

## 2018-02-02 DIAGNOSIS — D225 Melanocytic nevi of trunk: Secondary | ICD-10-CM | POA: Diagnosis not present

## 2018-02-02 DIAGNOSIS — D2262 Melanocytic nevi of left upper limb, including shoulder: Secondary | ICD-10-CM | POA: Diagnosis not present

## 2018-02-02 DIAGNOSIS — D2271 Melanocytic nevi of right lower limb, including hip: Secondary | ICD-10-CM | POA: Diagnosis not present

## 2018-02-02 DIAGNOSIS — Z08 Encounter for follow-up examination after completed treatment for malignant neoplasm: Secondary | ICD-10-CM | POA: Diagnosis not present

## 2018-02-02 DIAGNOSIS — Z85828 Personal history of other malignant neoplasm of skin: Secondary | ICD-10-CM | POA: Diagnosis not present

## 2018-02-12 ENCOUNTER — Telehealth: Payer: Self-pay | Admitting: Family Medicine

## 2018-02-12 DIAGNOSIS — M549 Dorsalgia, unspecified: Secondary | ICD-10-CM

## 2018-02-12 NOTE — Telephone Encounter (Signed)
Patient wants to go to [facility] PT for "dry needling" for his back pain.   Christella Scheuermann is the one who performs this.    Can you put in a referral for this without seeing him?

## 2018-02-12 NOTE — Telephone Encounter (Signed)
Please review. Thanks!  

## 2018-02-16 NOTE — Telephone Encounter (Signed)
Referral was placed 

## 2018-02-16 NOTE — Telephone Encounter (Signed)
Ok with me 

## 2018-02-22 ENCOUNTER — Telehealth: Payer: Self-pay

## 2018-02-22 NOTE — Telephone Encounter (Signed)
Patient called to see if his appt with PT has been scheduled. Please advise. Thanks!

## 2018-03-01 DIAGNOSIS — M545 Low back pain: Secondary | ICD-10-CM | POA: Diagnosis not present

## 2018-03-02 ENCOUNTER — Ambulatory Visit: Payer: Self-pay | Admitting: Family Medicine

## 2018-03-04 DIAGNOSIS — M545 Low back pain: Secondary | ICD-10-CM | POA: Diagnosis not present

## 2018-03-08 ENCOUNTER — Telehealth: Payer: Self-pay | Admitting: Family Medicine

## 2018-03-08 NOTE — Telephone Encounter (Signed)
Unable to reach patient, unable to leave a voicemail.  

## 2018-03-09 ENCOUNTER — Encounter: Payer: Self-pay | Admitting: Family Medicine

## 2018-03-09 ENCOUNTER — Ambulatory Visit (INDEPENDENT_AMBULATORY_CARE_PROVIDER_SITE_OTHER): Payer: Medicare HMO | Admitting: Family Medicine

## 2018-03-09 VITALS — BP 116/56 | HR 72 | Temp 98.0°F | Resp 16 | Wt 209.0 lb

## 2018-03-09 DIAGNOSIS — I10 Essential (primary) hypertension: Secondary | ICD-10-CM | POA: Diagnosis not present

## 2018-03-09 DIAGNOSIS — M545 Low back pain: Secondary | ICD-10-CM | POA: Diagnosis not present

## 2018-03-09 DIAGNOSIS — G4700 Insomnia, unspecified: Secondary | ICD-10-CM | POA: Diagnosis not present

## 2018-03-09 DIAGNOSIS — E785 Hyperlipidemia, unspecified: Secondary | ICD-10-CM | POA: Diagnosis not present

## 2018-03-09 MED ORDER — LORAZEPAM 0.5 MG PO TABS: 0.5000 mg | ORAL_TABLET | Freq: Every evening | ORAL | 1 refills | Status: DC | PRN

## 2018-03-09 NOTE — Progress Notes (Signed)
Patient: [name] Male    DOB: August 06, 1933   84 y.o.   MRN: [id number] Visit Date: 03/09/2018  Today's Provider: [name], MD   Chief Complaint  Patient presents with  . Follow-up   Subjective:    HPI Pt is here for a 6 month follow up of his chronic problems. He reports that he has been feeling well. He has had a episode of his vertigo. He also has been having his chronic back pain. He also would like to discuss getting off of the lorazepam. Also would like to know since he has glaucoma if he is ok to take the OTC meclizine.     No Known Allergies   Current Outpatient Medications:  .  amLODipine (NORVASC) 10 MG tablet, TAKE 1 TABLET EVERY EVENING, Disp: 90 tablet, Rfl: 3 .  aspirin 81 MG tablet, Take 81 mg by mouth daily.  , Disp: , Rfl:  .  Cholecalciferol (VITAMIN D3) 2000 units TABS, Take 1 tablet by mouth daily., Disp: , Rfl:  .  co-enzyme Q-10 30 MG capsule, Take 30 mg by mouth daily., Disp: , Rfl:  .  dorzolamide (TRUSOPT) 2 % ophthalmic solution, Place 1 drop 2 (two) times daily into both eyes. , Disp: , Rfl:  .  doxazosin (CARDURA) 2 MG tablet, TAKE 1 TABLET EVERY DAY, Disp: 90 tablet, Rfl: 3 .  ibuprofen (ADVIL,MOTRIN) 200 MG tablet, Take 200 mg by mouth daily as needed for moderate pain., Disp: , Rfl:  .  KLOR-CON M20 20 MEQ tablet, TAKE 1 TABLET EVERY DAY, Disp: 90 tablet, Rfl: 3 .  labetalol (NORMODYNE) 200 MG tablet, TAKE 1 TABLET TWICE DAILY, Disp: 180 tablet, Rfl: 3 .  latanoprost (XALATAN) 0.005 % ophthalmic solution, 1 drop at bedtime.  , Disp: , Rfl:  .  loratadine (CLARITIN) 10 MG tablet, Take 1 tablet (10 mg total) by mouth daily., Disp: 30 tablet, Rfl: 11 .  LORazepam (ATIVAN) 1 MG tablet, TAKE 1 TABLET AT BEDTIME AS NEEDED FOR SLEEP, Disp: 90 tablet, Rfl: 1 .  losartan-hydrochlorothiazide (HYZAAR) 100-25 MG tablet, TAKE 1 TABLET EVERY DAY, Disp: 90 tablet, Rfl: 3 .  meclizine (ANTIVERT) 25 MG tablet, Take 1 tablet by mouth daily as needed  for dizziness. , Disp: , Rfl:  .  Omega-3 Fatty Acids (FISH OIL) 1000 MG CAPS, Take by mouth daily., Disp: , Rfl:  .  omeprazole (PRILOSEC) 20 MG capsule, TAKE 1 CAPSULE EVERY DAY, Disp: 90 capsule, Rfl: 3 .  ranitidine (ZANTAC) 150 MG tablet, TAKE 1 TABLET AT BEDTIME, Disp: 90 tablet, Rfl: 3 .  rosuvastatin (CRESTOR) 40 MG tablet, Take 1 tablet (40 mg total) by mouth daily., Disp: 90 tablet, Rfl: 3 .  predniSONE (DELTASONE) 10 MG tablet, Take 1 tablet (10 mg total) by mouth daily with breakfast. Take 6 tablets day 1 and decrease by 1 tablet each day (Patient not taking: Reported on 03/09/2018), Disp: 21 tablet, Rfl: 0  Review of Systems  Constitutional: Negative.   HENT: Negative.   Eyes: Negative.   Respiratory: Negative.   Cardiovascular: Negative.   Gastrointestinal: Negative.   Endocrine: Negative.   Genitourinary: Negative.   Musculoskeletal: Positive for back pain.  Skin: Negative.   Allergic/Immunologic: Negative.   Neurological: Positive for dizziness.  Hematological: Negative.   Psychiatric/Behavioral: Negative.     Social History   Tobacco Use  . Smoking status: Never Smoker  . Smokeless tobacco: Never Used  Substance Use Topics  .  Alcohol use: Yes    Comment: drinks a beer occasionally   Objective:   BP (!) 116/56 (BP Location: Left Arm, Patient Position: Sitting, Cuff Size: Large)   Pulse 72   Temp 98 F (36.7 C) (Oral)   Resp 16   Wt 209 lb (94.8 kg)   SpO2 96%   BMI 31.78 kg/m  Vitals:   03/09/18 1608  BP: (!) 116/56  Pulse: 72  Resp: 16  Temp: 98 F (36.7 C)  TempSrc: Oral  SpO2: 96%  Weight: 209 lb (94.8 kg)     Physical Exam  Constitutional: He is oriented to person, place, and time. He appears well-developed and well-nourished.  HENT:  Head: Normocephalic and atraumatic.  Right Ear: External ear normal.  Left Ear: External ear normal.  Nose: Nose normal.  Eyes: Conjunctivae are normal. No scleral icterus.  Cardiovascular: Normal rate,  regular rhythm and normal heart sounds.  Pulmonary/Chest: Effort normal and breath sounds normal.  Abdominal: Soft.  Musculoskeletal: He exhibits no edema.  Neurological: He is alert and oriented to person, place, and time.  Skin: Skin is warm and dry.  Psychiatric: He has a normal mood and affect. His behavior is normal. Judgment and thought content normal.        Assessment & Plan:     1. Essential hypertension   2. Hyperlipidemia, unspecified hyperlipidemia type   3. Insomnia, unspecified type Cut lorazepam dose in half due to age of pt--he is informed. - LORazepam (ATIVAN) 0.5 MG tablet; Take 1 tablet (0.5 mg total) by mouth at bedtime as needed. for sleep  Dispense: 90 tablet; Refill: 1 4.CAD     I have done the exam and reviewed the above chart and it is accurate to the best of my knowledge. Development worker, community has been used in this note in any air is in the dictation or transcription are unintentional.  [name], MD  [place]

## 2018-03-11 DIAGNOSIS — M545 Low back pain: Secondary | ICD-10-CM | POA: Diagnosis not present

## 2018-03-15 ENCOUNTER — Ambulatory Visit: Payer: Medicare HMO | Admitting: Family Medicine

## 2018-03-16 DIAGNOSIS — M545 Low back pain: Secondary | ICD-10-CM | POA: Diagnosis not present

## 2018-03-17 ENCOUNTER — Other Ambulatory Visit: Payer: Self-pay | Admitting: Cardiology

## 2018-03-17 ENCOUNTER — Other Ambulatory Visit: Payer: Self-pay | Admitting: Family Medicine

## 2018-03-18 NOTE — Telephone Encounter (Signed)
Rx sent to pharmacy   

## 2018-03-19 DIAGNOSIS — M545 Low back pain: Secondary | ICD-10-CM | POA: Diagnosis not present

## 2018-03-25 DIAGNOSIS — M545 Low back pain: Secondary | ICD-10-CM | POA: Diagnosis not present

## 2018-04-20 ENCOUNTER — Telehealth: Payer: Self-pay | Admitting: Family Medicine

## 2018-04-20 NOTE — Telephone Encounter (Signed)
Left message to call back  

## 2018-04-20 NOTE — Telephone Encounter (Signed)
Patient walked in wanting a RX for Elocon for "itching inside his ears". He usually uses a Qtip to relieve the itch but he is scared to continue this.    He said a friend of his uses this and Dr. Tami Ribas gave it to her.     Please let patient know if you call it in or not.

## 2018-04-20 NOTE — Telephone Encounter (Signed)
Not without knowing what it is--needs to be seen by someone.

## 2018-04-21 DIAGNOSIS — H60549 Acute eczematoid otitis externa, unspecified ear: Secondary | ICD-10-CM | POA: Diagnosis not present

## 2018-04-21 DIAGNOSIS — H8149 Vertigo of central origin, unspecified ear: Secondary | ICD-10-CM | POA: Diagnosis not present

## 2018-04-21 DIAGNOSIS — R42 Dizziness and giddiness: Secondary | ICD-10-CM | POA: Diagnosis not present

## 2018-04-21 NOTE — Telephone Encounter (Signed)
Patient said he had an appt. With Dr. Tami Ribas today for his ear issues.cbe

## 2018-04-26 DIAGNOSIS — H40153 Residual stage of open-angle glaucoma, bilateral: Secondary | ICD-10-CM | POA: Diagnosis not present

## 2018-05-03 ENCOUNTER — Ambulatory Visit (INDEPENDENT_AMBULATORY_CARE_PROVIDER_SITE_OTHER): Payer: Medicare HMO | Admitting: Family Medicine

## 2018-05-03 ENCOUNTER — Other Ambulatory Visit: Payer: Self-pay | Admitting: Cardiology

## 2018-05-03 VITALS — BP 122/60 | HR 76 | Temp 98.1°F | Resp 16 | Wt 209.0 lb

## 2018-05-03 DIAGNOSIS — R42 Dizziness and giddiness: Secondary | ICD-10-CM | POA: Diagnosis not present

## 2018-05-03 DIAGNOSIS — I25810 Atherosclerosis of coronary artery bypass graft(s) without angina pectoris: Secondary | ICD-10-CM

## 2018-05-03 DIAGNOSIS — I2510 Atherosclerotic heart disease of native coronary artery without angina pectoris: Secondary | ICD-10-CM

## 2018-05-03 MED ORDER — FLUTICASONE PROPIONATE 50 MCG/ACT NA SUSP: 2.0000 | Freq: Every day | NASAL | 6 refills | Status: DC

## 2018-05-03 MED ORDER — MECLIZINE HCL 25 MG PO TABS: 25.0000 mg | ORAL_TABLET | Freq: Every day | ORAL | 12 refills | Status: DC | PRN

## 2018-05-03 NOTE — Progress Notes (Signed)
[name] Vi  MRN: [id number] DOB: 17-Jan-1933  Subjective:  HPI   The patient is an 84 year old male who presents for evaluation of vertigo.  The patient has had a history of this at times.  He has seen the ENT and they did some maneuver for the dizziness but also said that they wanted him to see Dr [name], neurology. The patient said that this episode started about 1 week ago.  He was out to breakfast and had an episode that caused him to have someone else bring him home.  He describes the feeling as the room spinning when it first happened.  Now he says that it is now just the feeling that he is off balance.  No CP/SOB or any cardiac symptoms.  Patient Active Problem List   Diagnosis Date Noted  . Arthropathy of hand 06/11/2015  . Arteriosclerosis of coronary artery 06/11/2015  . Impotence of organic origin 06/11/2015  . Generalized anxiety disorder 06/11/2015  . Acid reflux 06/11/2015  . History of other malignant neoplasm of skin 06/11/2015  . HLD (hyperlipidemia) 06/11/2015  . Arthritis, degenerative 06/11/2015  . Adiposity 06/11/2015  . Venous insufficiency of leg 06/11/2015  . HYPERLIPIDEMIA 08/16/2009  . ANEMIA 08/16/2009  . ANXIETY 08/16/2009  . GERD 08/16/2009  . OSTEOARTHRITIS 08/16/2009  . Umbilical hernia without obstruction and without gangrene 03/28/2008  . Essential hypertension 10/20/2006  . Coronary atherosclerosis 10/20/2006  . CARDIAC ARRHYTHMIA 10/20/2006  . SEPTIC ARTHRITIS 10/20/2006    Past Medical History:  Diagnosis Date  . Anemia, unspecified   . Anxiety state, unspecified   . Coronary atherosclerosis   . GERD (gastroesophageal reflux disease)   . Osteoarthritis    group B streptococcal septic arthritis  . Unspecified essential hypertension     Social History   Socioeconomic History  . Marital status: Divorced    Spouse name: Not on file  . Number of children: Not on file  . Years of education: Not on file  . Highest education level: Not  on file  Occupational History  . Not on file  Social Needs  . Financial resource strain: Not on file  . Food insecurity:    Worry: Not on file    Inability: Not on file  . Transportation needs:    Medical: Not on file    Non-medical: Not on file  Tobacco Use  . Smoking status: Never Smoker  . Smokeless tobacco: Never Used  Substance and Sexual Activity  . Alcohol use: Yes    Comment: drinks a beer occasionally  . Drug use: No  . Sexual activity: Not on file  Lifestyle  . Physical activity:    Days per week: Not on file    Minutes per session: Not on file  . Stress: Not on file  Relationships  . Social connections:    Talks on phone: Not on file    Gets together: Not on file    Attends religious service: Not on file    Active member of club or organization: Not on file    Attends meetings of clubs or organizations: Not on file    Relationship status: Not on file  . Intimate partner violence:    Fear of current or ex partner: Not on file    Emotionally abused: Not on file    Physically abused: Not on file    Forced sexual activity: Not on file  Other Topics Concern  . Not on file  Social History Narrative  .  Not on file    Outpatient Encounter Medications as of 05/03/2018  Medication Sig  . amLODipine (NORVASC) 10 MG tablet TAKE 1 TABLET EVERY EVENING  . aspirin 81 MG tablet Take 81 mg by mouth daily.    . Cholecalciferol (VITAMIN D3) 2000 units TABS Take 1 tablet by mouth daily.  Marland Kitchen co-enzyme Q-10 30 MG capsule Take 30 mg by mouth daily.  . dorzolamide (TRUSOPT) 2 % ophthalmic solution Place 1 drop 2 (two) times daily into both eyes.   Marland Kitchen doxazosin (CARDURA) 2 MG tablet TAKE 1 TABLET EVERY DAY  . ibuprofen (ADVIL,MOTRIN) 200 MG tablet Take 200 mg by mouth daily as needed for moderate pain.  Marland Kitchen labetalol (NORMODYNE) 200 MG tablet TAKE 1 TABLET TWICE DAILY  . latanoprost (XALATAN) 0.005 % ophthalmic solution 1 drop at bedtime.    Marland Kitchen loratadine (CLARITIN) 10 MG tablet  Take 1 tablet (10 mg total) by mouth daily.  Marland Kitchen LORazepam (ATIVAN) 0.5 MG tablet Take 1 tablet (0.5 mg total) by mouth at bedtime as needed. for sleep  . losartan-hydrochlorothiazide (HYZAAR) 100-25 MG tablet TAKE 1 TABLET EVERY DAY  . meclizine (ANTIVERT) 25 MG tablet Take 1 tablet by mouth daily as needed for dizziness.   . Omega-3 Fatty Acids (FISH OIL) 1000 MG CAPS Take by mouth daily.  Marland Kitchen omeprazole (PRILOSEC) 20 MG capsule TAKE 1 CAPSULE EVERY DAY  . potassium chloride SA (K-DUR,KLOR-CON) 20 MEQ tablet TAKE 1 TABLET EVERY DAY  . ranitidine (ZANTAC) 150 MG tablet TAKE 1 TABLET AT BEDTIME  . rosuvastatin (CRESTOR) 40 MG tablet Take 1 tablet (40 mg total) by mouth daily.  . [DISCONTINUED] predniSONE (DELTASONE) 10 MG tablet Take 1 tablet (10 mg total) by mouth daily with breakfast. Take 6 tablets day 1 and decrease by 1 tablet each day   No facility-administered encounter medications on file as of 05/03/2018.     No Known Allergies  Review of Systems  Constitutional: Positive for malaise/fatigue. Negative for fever.  Eyes: Negative.   Respiratory: Negative for cough and shortness of breath.   Cardiovascular: Negative for chest pain, palpitations, orthopnea, claudication and leg swelling.  Gastrointestinal: Negative.   Skin: Negative.   Neurological: Positive for dizziness.  Endo/Heme/Allergies: Negative.   Psychiatric/Behavioral: Negative.     Objective:  BP 122/60 (BP Location: Right Arm, Patient Position: Sitting, Cuff Size: Normal)   Pulse 76   Temp 98.1 F (36.7 C) (Oral)   Resp 16   Wt 209 lb (94.8 kg)   BMI 31.78 kg/m   Physical Exam  Constitutional: He is oriented to person, place, and time and well-developed, well-nourished, and in no distress.  HENT:  Head: Normocephalic and atraumatic.  Right Ear: External ear normal.  Left Ear: External ear normal.  Nose: Nose normal.  Eyes: Conjunctivae are normal. No scleral icterus.  Bilateral nystagmus   Neck: No  thyromegaly present.  Cardiovascular: Normal rate, regular rhythm and normal heart sounds.  Pulmonary/Chest: Effort normal and breath sounds normal.  Musculoskeletal: He exhibits no edema.  Neurological: He is alert and oriented to person, place, and time. Gait normal. GCS score is 15.  Skin: Skin is warm and dry.  Psychiatric: Mood, memory, affect and judgment normal.    Assessment and Plan :  1. Dizzy/Vertigo Neurology evaluation pending. Hydration encouraged. - meclizine (ANTIVERT) 25 MG tablet; Take 1 tablet (25 mg total) by mouth daily as needed for dizziness.  Dispense: 30 tablet; Refill: 12 - fluticasone (FLONASE) 50 MCG/ACT nasal spray; Place 2  sprays into both nostrils daily.  Dispense: 16 g; Refill: 6 - EKG 12-Lead 2.CAD All risk factors treated.  I have done the exam and reviewed the chart and it is accurate to the best of my knowledge. Development worker, community has been used and  any errors in dictation or transcription are unintentional. Miguel Aschoff M.D. [facility]

## 2018-05-19 ENCOUNTER — Other Ambulatory Visit: Payer: Self-pay | Admitting: Cardiology

## 2018-05-20 ENCOUNTER — Telehealth: Payer: Self-pay

## 2018-05-20 NOTE — Telephone Encounter (Signed)
Pt called requesting a handicapped parking form to be filled out.  He is needing it for his chronic back and hip pain.  Will he need to schedule an appointment?    Contact: [phone]  Thanks,   -Mickel Baas

## 2018-05-27 NOTE — Telephone Encounter (Signed)
Pt was advised form was completed and ready for pick up. Pt stated he would try to pick up the form Friday 05/28/18 morning. Thanks TNP

## 2018-05-31 ENCOUNTER — Other Ambulatory Visit: Payer: Self-pay | Admitting: Family Medicine

## 2018-05-31 DIAGNOSIS — K219 Gastro-esophageal reflux disease without esophagitis: Secondary | ICD-10-CM

## 2018-06-24 DIAGNOSIS — R42 Dizziness and giddiness: Secondary | ICD-10-CM | POA: Diagnosis not present

## 2018-07-27 ENCOUNTER — Other Ambulatory Visit: Payer: Self-pay | Admitting: Cardiology

## 2018-08-05 ENCOUNTER — Telehealth: Payer: Self-pay

## 2018-08-05 NOTE — Telephone Encounter (Signed)
LMTCB on VM. Pt needs to schedule his AWV for anytime after 09/01/18. -MM

## 2018-08-12 DIAGNOSIS — H524 Presbyopia: Secondary | ICD-10-CM | POA: Diagnosis not present

## 2018-08-12 DIAGNOSIS — H40153 Residual stage of open-angle glaucoma, bilateral: Secondary | ICD-10-CM | POA: Diagnosis not present

## 2018-08-14 ENCOUNTER — Other Ambulatory Visit: Payer: Self-pay | Admitting: Family Medicine

## 2018-08-17 ENCOUNTER — Telehealth: Payer: Self-pay

## 2018-08-17 NOTE — Telephone Encounter (Signed)
Patient had called office stating that [facility] has faxed [place] form for refill on Losartan-HCTZ. Patient states that refill request has been sent over multiple times with no reply. Patient is asking that we fax refill request to [phone]. KW

## 2018-08-17 NOTE — Telephone Encounter (Signed)
done

## 2018-08-27 NOTE — Telephone Encounter (Signed)
Scheduled AWV for 09/03/18 @ 1:40 PM -MM

## 2018-09-03 ENCOUNTER — Ambulatory Visit: Payer: Medicare HMO

## 2018-09-06 ENCOUNTER — Other Ambulatory Visit: Payer: Self-pay | Admitting: Cardiology

## 2018-09-06 DIAGNOSIS — I25810 Atherosclerosis of coronary artery bypass graft(s) without angina pectoris: Secondary | ICD-10-CM

## 2018-09-07 NOTE — Telephone Encounter (Signed)
Rx request sent to pharmacy.  

## 2018-09-08 ENCOUNTER — Ambulatory Visit (INDEPENDENT_AMBULATORY_CARE_PROVIDER_SITE_OTHER): Payer: Medicare HMO | Admitting: Family Medicine

## 2018-09-08 ENCOUNTER — Encounter: Payer: Self-pay | Admitting: Family Medicine

## 2018-09-08 VITALS — BP 113/71 | HR 80 | Temp 97.8°F | Resp 16 | Ht 68.0 in | Wt 211.0 lb

## 2018-09-08 DIAGNOSIS — M19049 Primary osteoarthritis, unspecified hand: Secondary | ICD-10-CM | POA: Diagnosis not present

## 2018-09-08 DIAGNOSIS — I10 Essential (primary) hypertension: Secondary | ICD-10-CM | POA: Diagnosis not present

## 2018-09-08 DIAGNOSIS — E785 Hyperlipidemia, unspecified: Secondary | ICD-10-CM

## 2018-09-08 DIAGNOSIS — Z23 Encounter for immunization: Secondary | ICD-10-CM

## 2018-09-08 DIAGNOSIS — I2510 Atherosclerotic heart disease of native coronary artery without angina pectoris: Secondary | ICD-10-CM

## 2018-09-08 NOTE — Progress Notes (Signed)
Patient: [name] Male    DOB: Aug 30, 1933   84 y.o.   MRN: [id number] Visit Date: 09/08/2018  Today's Provider: [name], MD   Chief Complaint  Patient presents with  . Follow-up  . Hypertension  . Hyperlipidemia  . Insomnia  . Coronary Artery Disease   Subjective:    HPI   Hypertension, follow-up:  BP Readings from Last 3 Encounters:  09/08/18 113/71  05/03/18 122/60  03/09/18 (!) 116/56    He was last seen for hypertension 6 months ago.  BP at that visit was 116/58. Management since that visit includes; no changes.He reports good compliance with treatment. He is not having side effects. none He is exercising. He is adherent to low salt diet.   Outside blood pressures are 132/70. He is experiencing none.  Patient denies none.   Cardiovascular risk factors include advanced age (older than 65 for men, 31 for women).  Use of agents associated with hypertension: none.   ---------------------------------------------------------------    Lipid/Cholesterol, Follow-up:   Last seen for this 6 months ago.  Management since that visit includes; no changes.  Last Lipid Panel:    Component Value Date/Time   CHOL 102 09/02/2017 0856   CHOL 112 08/07/2016 0828   TRIG 106 09/02/2017 0856   HDL 43 09/02/2017 0856   HDL 35 (L) 08/07/2016 0828   CHOLHDL 2.4 09/02/2017 0856   LDLCALC 40 09/02/2017 0856    He reports good compliance with treatment. He is not having side effects. none  Wt Readings from Last 3 Encounters:  09/08/18 211 lb (95.7 kg)  05/03/18 209 lb (94.8 kg)  03/09/18 209 lb (94.8 kg)    ---------------------------------------------------------------  Insomnia, unspecified type From 03/09/2018-Cut lorazepam dose in half due to age of pt--he is informed.  CAD From 03/09/2018-no changes.   No Known Allergies   Current Outpatient Medications:  .  amLODipine (NORVASC) 10 MG tablet, TAKE 1 TABLET EVERY EVENING, Disp: 90  tablet, Rfl: 3 .  aspirin 81 MG tablet, Take 81 mg by mouth daily.  , Disp: , Rfl:  .  Cholecalciferol (VITAMIN D3) 2000 units TABS, Take 1 tablet by mouth daily., Disp: , Rfl:  .  co-enzyme Q-10 30 MG capsule, Take 30 mg by mouth daily., Disp: , Rfl:  .  dorzolamide (TRUSOPT) 2 % ophthalmic solution, Place 1 drop 2 (two) times daily into both eyes. , Disp: , Rfl:  .  doxazosin (CARDURA) 2 MG tablet, TAKE 1 TABLET EVERY DAY, Disp: 90 tablet, Rfl: 3 .  fluticasone (FLONASE) 50 MCG/ACT nasal spray, Place 2 sprays into both nostrils daily., Disp: 16 g, Rfl: 6 .  ibuprofen (ADVIL,MOTRIN) 200 MG tablet, Take 200 mg by mouth daily as needed for moderate pain., Disp: , Rfl:  .  labetalol (NORMODYNE) 200 MG tablet, TAKE 1 TABLET TWICE DAILY, Disp: 180 tablet, Rfl: 3 .  latanoprost (XALATAN) 0.005 % ophthalmic solution, 1 drop at bedtime.  , Disp: , Rfl:  .  loratadine (CLARITIN) 10 MG tablet, Take 1 tablet (10 mg total) by mouth daily., Disp: 30 tablet, Rfl: 11 .  LORazepam (ATIVAN) 0.5 MG tablet, Take 1 tablet (0.5 mg total) by mouth at bedtime as needed. for sleep, Disp: 90 tablet, Rfl: 1 .  losartan-hydrochlorothiazide (HYZAAR) 100-25 MG tablet, TAKE 1 TABLET EVERY DAY, Disp: 90 tablet, Rfl: 3 .  meclizine (ANTIVERT) 25 MG tablet, Take 1 tablet (25 mg total) by mouth daily as needed for  dizziness., Disp: 30 tablet, Rfl: 12 .  Omega-3 Fatty Acids (FISH OIL) 1000 MG CAPS, Take by mouth daily., Disp: , Rfl:  .  omeprazole (PRILOSEC) 20 MG capsule, TAKE 1 CAPSULE EVERY DAY, Disp: 90 capsule, Rfl: 3 .  potassium chloride SA (K-DUR,KLOR-CON) 20 MEQ tablet, TAKE 1 TABLET EVERY DAY, Disp: 90 tablet, Rfl: 0 .  ranitidine (ZANTAC) 150 MG tablet, TAKE 1 TABLET AT BEDTIME, Disp: 90 tablet, Rfl: 3 .  rosuvastatin (CRESTOR) 40 MG tablet, TAKE 1 TABLET EVERY DAY (NEED MD APPOINTMENT), Disp: 90 tablet, Rfl: 0  Review of Systems  Constitutional: Negative for appetite change, chills and fever.  HENT: Negative.     Eyes: Negative.   Respiratory: Negative for chest tightness, shortness of breath and wheezing.   Cardiovascular: Negative for chest pain and palpitations.  Gastrointestinal: Negative for abdominal pain, nausea and vomiting.  Endocrine: Negative.   Allergic/Immunologic: Negative.   Psychiatric/Behavioral: Negative.     Social History   Tobacco Use  . Smoking status: Never Smoker  . Smokeless tobacco: Never Used  Substance Use Topics  . Alcohol use: Yes    Comment: drinks a beer occasionally   Objective:   BP 113/71 (BP Location: Right Arm, Patient Position: Sitting, Cuff Size: Large)   Pulse 80   Temp 97.8 F (36.6 C) (Oral)   Resp 16   Ht 5\' 8"  (1.727 m)   Wt 211 lb (95.7 kg)   SpO2 96%   BMI 32.08 kg/m  Vitals:   09/08/18 1333  BP: 113/71  Pulse: 80  Resp: 16  Temp: 97.8 F (36.6 C)  TempSrc: Oral  SpO2: 96%  Weight: 211 lb (95.7 kg)  Height: 5\' 8"  (1.727 m)     Physical Exam  Constitutional: He is oriented to person, place, and time. He appears well-developed and well-nourished.  HENT:  Head: Normocephalic and atraumatic.  Right Ear: External ear normal.  Left Ear: External ear normal.  Eyes: Conjunctivae are normal. No scleral icterus.  Neck: No thyromegaly present.  Cardiovascular: Normal rate, regular rhythm and normal heart sounds.  Pulmonary/Chest: Effort normal and breath sounds normal.  Abdominal: Soft.  Musculoskeletal: He exhibits no edema.  Neurological: He is alert and oriented to person, place, and time.  Skin: Skin is warm and dry.  Many SKs.  Psychiatric: He has a normal mood and affect. His behavior is normal. Judgment and thought content normal.        Assessment & Plan:     1. Essential hypertension  - Lipid panel - CBC w/Diff/Platelet - TSH - Comprehensive Metabolic Panel (CMET)  2. Hyperlipidemia, unspecified hyperlipidemia type  - Lipid panel - CBC w/Diff/Platelet - TSH - Comprehensive Metabolic Panel (CMET)  3.  Atherosclerosis of native coronary artery of native heart without angina pectoris All risk factors treated. - Lipid panel - CBC w/Diff/Platelet - TSH - Comprehensive Metabolic Panel (CMET)  4. Arthropathy of hand  I have done the exam and reviewed the chart and it is accurate to the best of my knowledge. Development worker, community has been used and  any errors in dictation or transcription are unintentional. Miguel Aschoff M.D. [facility]       [name], MD

## 2018-09-08 NOTE — Patient Instructions (Addendum)
1. Essential hypertension -METC -lipid panel -TSH -CBC   2. Hyperlipidemia, unspecified hyperlipidemia type -METC -lipid panel -TSH -CBC  3. Insomnia, unspecified type   4.CAD -METC -lipid panel -TSH -CBC  Influenza, High Dose Seasonal PF  Return for follow-up in 6 months

## 2018-09-13 ENCOUNTER — Other Ambulatory Visit: Payer: Self-pay | Admitting: Family Medicine

## 2018-09-13 DIAGNOSIS — I10 Essential (primary) hypertension: Secondary | ICD-10-CM | POA: Diagnosis not present

## 2018-09-13 DIAGNOSIS — G4700 Insomnia, unspecified: Secondary | ICD-10-CM

## 2018-09-13 DIAGNOSIS — E785 Hyperlipidemia, unspecified: Secondary | ICD-10-CM | POA: Diagnosis not present

## 2018-09-13 DIAGNOSIS — I2510 Atherosclerotic heart disease of native coronary artery without angina pectoris: Secondary | ICD-10-CM | POA: Diagnosis not present

## 2018-09-13 MED ORDER — LORAZEPAM 0.5 MG PO TABS: 0.5000 mg | ORAL_TABLET | Freq: Every evening | ORAL | 1 refills | Status: DC | PRN

## 2018-09-13 NOTE — Telephone Encounter (Signed)
Pt needing refill of LORazepam (ATIVAN) 0.5 MG tablet sent to [facility].  Pt states he is about out of medication.

## 2018-09-13 NOTE — Telephone Encounter (Signed)
Please review. Thanks!  

## 2018-09-14 ENCOUNTER — Telehealth: Payer: Self-pay

## 2018-09-14 LAB — COMPREHENSIVE METABOLIC PANEL
ALT: 18 IU/L (ref 0–44)
AST: 14 IU/L (ref 0–40)
Albumin/Globulin Ratio: 2 (ref 1.2–2.2)
Albumin: 4.4 g/dL (ref 3.5–4.7)
Alkaline Phosphatase: 78 IU/L (ref 39–117)
BUN/Creatinine Ratio: 15 (ref 10–24)
BUN: 21 mg/dL (ref 8–27)
Bilirubin Total: 0.3 mg/dL (ref 0.0–1.2)
CALCIUM: 9.5 mg/dL (ref 8.6–10.2)
CO2: 22 mmol/L (ref 20–29)
CREATININE: 1.36 mg/dL — AB (ref 0.76–1.27)
Chloride: 107 mmol/L — ABNORMAL HIGH (ref 96–106)
GFR calc Af Amer: 55 mL/min/{1.73_m2} — ABNORMAL LOW (ref >59)
GFR, EST NON AFRICAN AMERICAN: 48 mL/min/{1.73_m2} — AB (ref >59)
GLOBULIN, TOTAL: 2.2 g/dL (ref 1.5–4.5)
GLUCOSE: 109 mg/dL — AB (ref 65–99)
Potassium: 3.8 mmol/L (ref 3.5–5.2)
SODIUM: 147 mmol/L — AB (ref 134–144)
Total Protein: 6.6 g/dL (ref 6.0–8.5)

## 2018-09-14 LAB — CBC WITH DIFFERENTIAL/PLATELET
Basophils Absolute: 0 10*3/uL (ref 0.0–0.2)
Basos: 1 %
EOS (ABSOLUTE): 0.3 10*3/uL (ref 0.0–0.4)
EOS: 5 %
HEMATOCRIT: 40.1 % (ref 37.5–51.0)
Hemoglobin: 13.7 g/dL (ref 13.0–17.7)
IMMATURE GRANS (ABS): 0 10*3/uL (ref 0.0–0.1)
IMMATURE GRANULOCYTES: 1 %
LYMPHS: 16 %
Lymphocytes Absolute: 1 10*3/uL (ref 0.7–3.1)
MCH: 31.2 pg (ref 26.6–33.0)
MCHC: 34.2 g/dL (ref 31.5–35.7)
MCV: 91 fL (ref 79–97)
Monocytes Absolute: 0.5 10*3/uL (ref 0.1–0.9)
Monocytes: 9 %
NEUTROS PCT: 68 %
Neutrophils Absolute: 4.1 10*3/uL (ref 1.4–7.0)
PLATELETS: 233 10*3/uL (ref 150–450)
RBC: 4.39 x10E6/uL (ref 4.14–5.80)
RDW: 13.8 % (ref 12.3–15.4)
WBC: 5.9 10*3/uL (ref 3.4–10.8)

## 2018-09-14 LAB — LIPID PANEL
Chol/HDL Ratio: 2.4 ratio (ref 0.0–5.0)
Cholesterol, Total: 99 mg/dL — ABNORMAL LOW (ref 100–199)
HDL: 41 mg/dL (ref >39)
LDL Calculated: 44 mg/dL (ref 0–99)
Triglycerides: 71 mg/dL (ref 0–149)
VLDL Cholesterol Cal: 14 mg/dL (ref 5–40)

## 2018-09-14 LAB — TSH: TSH: 2.2 u[IU]/mL (ref 0.450–4.500)

## 2018-09-14 NOTE — Telephone Encounter (Signed)
-----   Message from Jerrol Banana., MD sent at 09/14/2018 10:57 AM EST ----- Labs stable--push water/hydrate a little more.

## 2018-09-14 NOTE — Telephone Encounter (Signed)
Patient was advised.  

## 2018-09-14 NOTE — Telephone Encounter (Signed)
[id number] 

## 2018-09-15 ENCOUNTER — Telehealth: Payer: Self-pay

## 2018-09-15 ENCOUNTER — Ambulatory Visit (INDEPENDENT_AMBULATORY_CARE_PROVIDER_SITE_OTHER): Payer: Medicare HMO

## 2018-09-15 VITALS — BP 122/60 | HR 74 | Temp 98.1°F | Ht 68.0 in | Wt 213.8 lb

## 2018-09-15 DIAGNOSIS — Z0000 Encounter for general adult medical examination without abnormal findings: Secondary | ICD-10-CM

## 2018-09-15 DIAGNOSIS — G4700 Insomnia, unspecified: Secondary | ICD-10-CM

## 2018-09-15 DIAGNOSIS — R42 Dizziness and giddiness: Secondary | ICD-10-CM

## 2018-09-15 NOTE — Progress Notes (Signed)
Subjective:   Johndaniel Catlin Cardozo is a 84 y.o. male who presents for Medicare Annual/Subsequent preventive examination.  Review of Systems:  N/A  Cardiac Risk Factors include: advanced age (>82men, >87 women);hypertension;dyslipidemia;male gender;obesity (BMI >30kg/m2)     Objective:    Vitals: BP 122/60 (BP Location: Right Arm)   Pulse 74   Temp 98.1 F (36.7 C) (Oral)   Ht 5\' 8"  (1.727 m)   Wt 213 lb 12.8 oz (97 kg)   BMI 32.51 kg/m   Body mass index is 32.51 kg/m.  Advanced Directives 09/15/2018 09/01/2017 08/06/2016 08/06/2016 11/05/2015  Does Patient Have a Medical Advance Directive? Yes Yes Yes Yes Yes  Type of Paramedic of [place];Living will Living will - [place];Living will [place];Living will  Copy of [place] in Chart? No - copy requested - - No - copy requested -    Tobacco Social History   Tobacco Use  Smoking Status Never Smoker  Smokeless Tobacco Never Used     Counseling given: Not Answered   Clinical Intake:  Pre-visit preparation completed: Yes  Pain : No/denies pain Pain Score: 0-No pain     Nutritional Status: BMI > 30  Obese Nutritional Risks: None Diabetes: No  How often do you need to have someone help you when you read instructions, pamphlets, or other written materials from your doctor or pharmacy?: 1 - Never  Interpreter Needed?: No  Information entered by :: [facility], LPN  Past Medical History:  Diagnosis Date  . Anemia, unspecified   . Anxiety state, unspecified   . Coronary atherosclerosis   . GERD (gastroesophageal reflux disease)   . Osteoarthritis    group B streptococcal septic arthritis  . Unspecified essential hypertension    History reviewed. No pertinent surgical history. Family History  Problem Relation Age of Onset  . Parkinson's disease Mother   . Heart disease Mother   . Dementia Mother   . Cancer Father        lung  cancer, smoker  . Depression Sister   . Fibromyalgia Sister   . Arthritis Brother        B/L knee replacement  . Sleep apnea Brother    Social History   Socioeconomic History  . Marital status: Divorced    Spouse name: Not on file  . Number of children: 3  . Years of education: Not on file  . Highest education level: Some college, no degree  Occupational History  . Occupation: retired  Scientific laboratory technician  . Financial resource strain: Not hard at all  . Food insecurity:    Worry: Never true    Inability: Never true  . Transportation needs:    Medical: No    Non-medical: No  Tobacco Use  . Smoking status: Never Smoker  . Smokeless tobacco: Never Used  Substance and Sexual Activity  . Alcohol use: Yes    Comment: drinks a beer occasionally  . Drug use: No  . Sexual activity: Not on file  Lifestyle  . Physical activity:    Days per week: 0 days    Minutes per session: 0 min  . Stress: Only a little  Relationships  . Social connections:    Talks on phone: Patient refused    Gets together: Patient refused    Attends religious service: Patient refused    Active member of club or organization: Patient refused    Attends meetings of clubs or organizations: Patient  refused    Relationship status: Patient refused  Other Topics Concern  . Not on file  Social History Narrative  . Not on file    Outpatient Encounter Medications as of 09/15/2018  Medication Sig  . amLODipine (NORVASC) 10 MG tablet TAKE 1 TABLET EVERY EVENING  . aspirin 81 MG tablet Take 81 mg by mouth daily.    . Cholecalciferol (VITAMIN D3) 2000 units TABS Take 1 tablet by mouth daily.  Marland Kitchen co-enzyme Q-10 30 MG capsule Take 30 mg by mouth daily.  . dorzolamide (TRUSOPT) 2 % ophthalmic solution Place 1 drop 2 (two) times daily into both eyes.   Marland Kitchen doxazosin (CARDURA) 2 MG tablet TAKE 1 TABLET EVERY DAY  . fluticasone (FLONASE) 50 MCG/ACT nasal spray Place 2 sprays into both nostrils daily.  Marland Kitchen ibuprofen  (ADVIL,MOTRIN) 200 MG tablet Take 200 mg by mouth daily as needed for moderate pain.  Marland Kitchen labetalol (NORMODYNE) 200 MG tablet TAKE 1 TABLET TWICE DAILY  . latanoprost (XALATAN) 0.005 % ophthalmic solution 1 drop at bedtime.    Marland Kitchen loratadine (CLARITIN) 10 MG tablet Take 1 tablet (10 mg total) by mouth daily.  Marland Kitchen LORazepam (ATIVAN) 0.5 MG tablet TAKE 1 TABLET AT BEDTIME AS NEEDED FOR SLEEP  . losartan-hydrochlorothiazide (HYZAAR) 100-25 MG tablet TAKE 1 TABLET EVERY DAY  . meclizine (ANTIVERT) 25 MG tablet Take 1 tablet (25 mg total) by mouth daily as needed for dizziness.  . Omega-3 Fatty Acids (FISH OIL) 1000 MG CAPS Take by mouth daily.  Marland Kitchen omeprazole (PRILOSEC) 20 MG capsule TAKE 1 CAPSULE EVERY DAY  . potassium chloride SA (K-DUR,KLOR-CON) 20 MEQ tablet TAKE 1 TABLET EVERY DAY  . ranitidine (ZANTAC) 150 MG tablet TAKE 1 TABLET AT BEDTIME  . rosuvastatin (CRESTOR) 40 MG tablet TAKE 1 TABLET EVERY DAY (NEED MD APPOINTMENT)  . [DISCONTINUED] atorvastatin (LIPITOR) 40 MG tablet Take by mouth.   No facility-administered encounter medications on file as of 09/15/2018.     Activities of Daily Living In your present state of health, do you have any difficulty performing the following activities: 09/15/2018  Hearing? Y  Comment Wears a hearing aid in the left ear.   Vision? N  Difficulty concentrating or making decisions? N  Walking or climbing stairs? N  Dressing or bathing? N  Doing errands, shopping? N  Preparing Food and eating ? N  Using the Toilet? N  In the past six months, have you accidently leaked urine? N  Do you have problems with loss of bowel control? N  Managing your Medications? N  Managing your Finances? N  Housekeeping or managing your Housekeeping? Y  Comment Has a cleaning aid to help due to back pains.   Some recent data might be hidden    Patient Care Team: Jerrol Banana., MD as PCP - General (Unknown Physician Specialty) Stanford Breed Denice Bors, MD  (Cardiology) Lorelee Cover., MD as Consulting Physician (Ophthalmology) Beverly Gust, MD (Otolaryngology)   Assessment:   This is a routine wellness examination for Jerian.  Exercise Activities and Dietary recommendations Current Exercise Habits: The patient does not participate in regular exercise at present, Exercise limited by: orthopedic condition(s)  Goals    . DIET - INCREASE WATER INTAKE     Recommend increasing water intake to 6 glasses a day.     . Increase water intake     Starting 08/06/16, I will increase to 4 glasses of water a day.       Fall Risk  Fall Risk  09/15/2018 09/01/2017 08/06/2016 06/11/2015  Falls in the past year? 0 Yes No No  Number falls in past yr: - 1 - -  Comment - slipped in rain - -  Injury with Fall? - No - -  Follow up - Falls prevention discussed - -   FALL RISK PREVENTION PERTAINING TO THE HOME:  Any stairs in or around the home WITH handrails? No  Home free of loose throw rugs in walkways, pet beds, electrical cords, etc? Yes  Adequate lighting in your home to reduce risk of falls? Yes   ASSISTIVE DEVICES UTILIZED TO PREVENT FALLS:  Life alert? No  Use of a cane, walker or w/c? Yes  Grab bars in the bathroom? No  Shower chair or bench in shower? No Elevated toilet seat or a handicapped toilet? Yes    TIMED UP AND GO:  Was the test performed? No .     Depression Screen PHQ 2/9 Scores 09/15/2018 09/15/2018 09/01/2017 08/06/2016  PHQ - 2 Score 1 1 2  0  PHQ- 9 Score 4 - 4 -    Cognitive Function     6CIT Screen 09/15/2018 09/01/2017 08/06/2016  What Year? 0 points 0 points 0 points  What month? 0 points 0 points 0 points  What time? 0 points 0 points 0 points  Count back from 20 0 points 0 points 0 points  Months in reverse 0 points 0 points 0 points  Repeat phrase 0 points 2 points 0 points  Total Score 0 2 0    Immunization History  Administered Date(s) Administered  . Influenza, High Dose Seasonal PF 08/09/2015,  07/14/2016, 07/23/2017, 09/08/2018  . Influenza-Unspecified 07/13/2013  . Pneumococcal Conjugate-13 04/11/2014  . Pneumococcal Polysaccharide-23 08/19/2006  . Td 05/01/2005  . Tdap 06/12/2015  . Zoster 02/05/2011  . Zoster Recombinat (Shingrix) 06/04/2018, 08/10/2018    Qualifies for Shingles Vaccine? Up to date  Tdap: Up to date  Flu Vaccine: Up to date.  Pneumococcal Vaccine: Up to date   Screening Tests Health Maintenance  Topic Date Due  . TETANUS/TDAP  06/11/2025  . INFLUENZA VACCINE  Completed  . PNA vac Low Risk Adult  Completed   Cancer Screenings:  Colorectal Screening: No longer required.   Lung Cancer Screening: (Low Dose CT Chest recommended if Age 48-80 years, 30 pack-year currently smoking OR have quit w/in 15years.) does not qualify.    Additional Screening:  Vision Screening: Recommended annual ophthalmology exams for early detection of glaucoma and other disorders of the eye.  Dental Screening: Recommended annual dental exams for proper oral hygiene  Community Resource Referral:  CRR required this visit?  No        Plan:  I have personally reviewed and addressed the Medicare Annual Wellness questionnaire and have noted the following in the patient's chart:  A. Medical and social history B. Use of alcohol, tobacco or illicit drugs  C. Current medications and supplements D. Functional ability and status E.  Nutritional status F.  Physical activity G. Advance directives H. List of other physicians I.  Hospitalizations, surgeries, and ER visits in previous 12 months J.  [place] such as hearing and vision if needed, cognitive and depression L. Referrals and appointments - none  In addition, I have reviewed and discussed with patient certain preventive protocols, quality metrics, and best practice recommendations. A written personalized care plan for preventive services as well as general preventive health recommendations were  provided to patient.  See attached  scanned questionnaire for additional information.   Signed,  Fabio Neighbors, LPN Nurse Health Advisor   Nurse Recommendations: None.

## 2018-09-15 NOTE — Telephone Encounter (Signed)
Pt was seen in the office today for his AWV and states that he forgot to ask Dr Rosanna Randy some questions when he was in last week. First, pt states he discussed insomnia with Dr Rosanna Randy but nothing came from it. Pt states his Lorazepam was cut back to 0.5mg  (from 1 mg) and with the trouble he has sleeping, he would like to know if this can be increased back to 1mg ? Pt states he does not have trouble falling asleep when he goes to bed, but rather when he gets up in the middle of the night to use the bathroom, he cant go back to sleep after that.  Secondly, pt wanted to know if he could get his Meclizine prescription sent to [place] for 90 days. Pt states if so he can get it for free or at a discounted price.  Please advise, thank you. -MM

## 2018-09-15 NOTE — Patient Instructions (Signed)
[name] , Thank you for taking time to come for your Medicare Wellness Visit. I appreciate your ongoing commitment to your health goals. Please review the following plan we discussed and let me know if I can assist you in the future.   Screening recommendations/referrals: Colonoscopy: No longer required.  Recommended yearly ophthalmology/optometry visit for glaucoma screening and checkup Recommended yearly dental visit for hygiene and checkup  Vaccinations: Influenza vaccine: Up to date Pneumococcal vaccine: Completed series Tdap vaccine: Up to date, due 05/2025 Shingles vaccine: Completed Shingrix series    Advanced directives: Please bring a copy of your POA (Power of Attorney) and/or Living Will to your next appointment.   Conditions/risks identified: Continue trying to increase water intake to 6 glasses daily.   Next appointment: 03/15/19 with Dr Rosanna Randy.   Preventive Care 30 Years and Older, Male Preventive care refers to lifestyle choices and visits with your health care provider that can promote health and wellness. What does preventive care include?  A yearly physical exam. This is also called an annual well check.  Dental exams once or twice a year.  Routine eye exams. Ask your health care provider how often you should have your eyes checked.  Personal lifestyle choices, including:  Daily care of your teeth and gums.  Regular physical activity.  Eating a healthy diet.  Avoiding tobacco and drug use.  Limiting alcohol use.  Practicing safe sex.  Taking low doses of aspirin every day.  Taking vitamin and mineral supplements as recommended by your health care provider. What happens during an annual well check? The services and screenings done by your health care provider during your annual well check will depend on your age, overall health, lifestyle risk factors, and family history of disease. Counseling  Your health care provider may ask you questions about  your:  Alcohol use.  Tobacco use.  Drug use.  Emotional well-being.  Home and relationship well-being.  Sexual activity.  Eating habits.  History of falls.  Memory and ability to understand (cognition).  Work and work Statistician. Screening  You may have the following tests or measurements:  Height, weight, and BMI.  Blood pressure.  Lipid and cholesterol levels. These may be checked every 5 years, or more frequently if you are over 84 years old.  Skin check.  Lung cancer screening. You may have this screening every year starting at age 43 if you have a 30-pack-year history of smoking and currently smoke or have quit within the past 15 years.  Fecal occult blood test (FOBT) of the stool. You may have this test every year starting at age 74.  Flexible sigmoidoscopy or colonoscopy. You may have a sigmoidoscopy every 5 years or a colonoscopy every 10 years starting at age 71.  Prostate cancer screening. Recommendations will vary depending on your family history and other risks.  Hepatitis C blood test.  Hepatitis B blood test.  Sexually transmitted disease (STD) testing.  Diabetes screening. This is done by checking your blood sugar (glucose) after you have not eaten for a while (fasting). You may have this done every 1-3 years.  Abdominal aortic aneurysm (AAA) screening. You may need this if you are a current or former smoker.  Osteoporosis. You may be screened starting at age 65 if you are at high risk. Talk with your health care provider about your test results, treatment options, and if necessary, the need for more tests. Vaccines  Your health care provider may recommend certain vaccines, such as:  Influenza vaccine. This is recommended every year.  Tetanus, diphtheria, and acellular pertussis (Tdap, Td) vaccine. You may need a Td booster every 10 years.  Zoster vaccine. You may need this after age 24.  Pneumococcal 13-valent conjugate (PCV13) vaccine.  One dose is recommended after age 9.  Pneumococcal polysaccharide (PPSV23) vaccine. One dose is recommended after age 72. Talk to your health care provider about which screenings and vaccines you need and how often you need them. This information is not intended to replace advice given to you by your health care provider. Make sure you discuss any questions you have with your health care provider. Document Released: 10/26/2015 Document Revised: 06/18/2016 Document Reviewed: 07/31/2015 Elsevier Interactive Patient Education  2017 [place] Prevention in the Home Falls can cause injuries. They can happen to people of all ages. There are many things you can do to make your home safe and to help prevent falls. What can I do on the outside of my home?  Regularly fix the edges of walkways and driveways and fix any cracks.  Remove anything that might make you trip as you walk through a door, such as a raised step or threshold.  Trim any bushes or trees on the path to your home.  Use bright outdoor lighting.  Clear any walking paths of anything that might make someone trip, such as rocks or tools.  Regularly check to see if handrails are loose or broken. Make sure that both sides of any steps have handrails.  Any raised decks and porches should have guardrails on the edges.  Have any leaves, snow, or ice cleared regularly.  Use sand or salt on walking paths during winter.  Clean up any spills in your garage right away. This includes oil or grease spills. What can I do in the bathroom?  Use night lights.  Install grab bars by the toilet and in the tub and shower. Do not use towel bars as grab bars.  Use non-skid mats or decals in the tub or shower.  If you need to sit down in the shower, use a plastic, non-slip stool.  Keep the floor dry. Clean up any water that spills on the floor as soon as it happens.  Remove soap buildup in the tub or shower regularly.  Attach bath  mats securely with double-sided non-slip rug tape.  Do not have throw rugs and other things on the floor that can make you trip. What can I do in the bedroom?  Use night lights.  Make sure that you have a light by your bed that is easy to reach.  Do not use any sheets or blankets that are too big for your bed. They should not hang down onto the floor.  Have a firm chair that has side arms. You can use this for support while you get dressed.  Do not have throw rugs and other things on the floor that can make you trip. What can I do in the kitchen?  Clean up any spills right away.  Avoid walking on wet floors.  Keep items that you use a lot in easy-to-reach places.  If you need to reach something above you, use a strong step stool that has a grab bar.  Keep electrical cords out of the way.  Do not use floor polish or wax that makes floors slippery. If you must use wax, use non-skid floor wax.  Do not have throw rugs and other things on the floor that  can make you trip. What can I do with my stairs?  Do not leave any items on the stairs.  Make sure that there are handrails on both sides of the stairs and use them. Fix handrails that are broken or loose. Make sure that handrails are as long as the stairways.  Check any carpeting to make sure that it is firmly attached to the stairs. Fix any carpet that is loose or worn.  Avoid having throw rugs at the top or bottom of the stairs. If you do have throw rugs, attach them to the floor with carpet tape.  Make sure that you have a light switch at the top of the stairs and the bottom of the stairs. If you do not have them, ask someone to add them for you. What else can I do to help prevent falls?  Wear shoes that:  Do not have high heels.  Have rubber bottoms.  Are comfortable and fit you well.  Are closed at the toe. Do not wear sandals.  If you use a stepladder:  Make sure that it is fully opened. Do not climb a closed  stepladder.  Make sure that both sides of the stepladder are locked into place.  Ask someone to hold it for you, if possible.  Clearly mark and make sure that you can see:  Any grab bars or handrails.  First and last steps.  Where the edge of each step is.  Use tools that help you move around (mobility aids) if they are needed. These include:  Canes.  Walkers.  Scooters.  Crutches.  Turn on the lights when you go into a dark area. Replace any light bulbs as soon as they burn out.  Set up your furniture so you have a clear path. Avoid moving your furniture around.  If any of your floors are uneven, fix them.  If there are any pets around you, be aware of where they are.  Review your medicines with your doctor. Some medicines can make you feel dizzy. This can increase your chance of falling. Ask your doctor what other things that you can do to help prevent falls. This information is not intended to replace advice given to you by your health care provider. Make sure you discuss any questions you have with your health care provider. Document Released: 07/26/2009 Document Revised: 03/06/2016 Document Reviewed: 11/03/2014 Elsevier Interactive Patient Education  2017 [employer].

## 2018-09-16 MED ORDER — MECLIZINE HCL 25 MG PO TABS: 25.0000 mg | ORAL_TABLET | Freq: Every day | ORAL | 3 refills | Status: DC | PRN

## 2018-09-16 NOTE — Telephone Encounter (Signed)
Meclizine has been sent to pharmacy.  I spoke with the patient and advised him that I would find out about the other when Dr G comes back Monday.  Will leave this message in nurse pool to remind me on Monday. ED

## 2018-09-20 NOTE — Progress Notes (Signed)
HPI: FU coronary artery disease status post coronary bypassing graft in May 1997. He also has a history of atrial flutter ablation. Previous abdominal ultrasound in May 2006 showed no significant renal artery stenosis and normal caliber aorta. Nuclear study 5/16 showed inferior ischemia and EF 61; we are treating medically. Since I last saw him, the patient has dyspnea with more extreme activities but not with routine activities. It is relieved with rest. It is not associated with chest pain. There is no orthopnea, PND or pedal edema. There is no syncope or palpitations. There is no exertional chest pain.   Current Outpatient Medications  Medication Sig Dispense Refill  . amLODipine (NORVASC) 10 MG tablet TAKE 1 TABLET EVERY EVENING 90 tablet 3  . aspirin 81 MG tablet Take 81 mg by mouth daily.      . Cholecalciferol (VITAMIN D3) 2000 units TABS Take 1 tablet by mouth daily.    Marland Kitchen co-enzyme Q-10 30 MG capsule Take 30 mg by mouth daily.    . dorzolamide (TRUSOPT) 2 % ophthalmic solution Place 1 drop 2 (two) times daily into both eyes.     Marland Kitchen doxazosin (CARDURA) 2 MG tablet TAKE 1 TABLET EVERY DAY 90 tablet 3  . fluticasone (FLONASE) 50 MCG/ACT nasal spray Place 2 sprays into both nostrils daily. 16 g 6  . ibuprofen (ADVIL,MOTRIN) 200 MG tablet Take 200 mg by mouth daily as needed for moderate pain.    Marland Kitchen labetalol (NORMODYNE) 200 MG tablet TAKE 1 TABLET TWICE DAILY 180 tablet 3  . latanoprost (XALATAN) 0.005 % ophthalmic solution 1 drop at bedtime.      Marland Kitchen loratadine (CLARITIN) 10 MG tablet Take 1 tablet (10 mg total) by mouth daily. 30 tablet 11  . LORazepam (ATIVAN) 0.5 MG tablet TAKE 1 TABLET AT BEDTIME AS NEEDED FOR SLEEP 30 tablet 5  . losartan-hydrochlorothiazide (HYZAAR) 100-25 MG tablet TAKE 1 TABLET EVERY DAY 90 tablet 3  . meclizine (ANTIVERT) 25 MG tablet Take 1 tablet (25 mg total) by mouth daily as needed for dizziness. 90 tablet 3  . Omega-3 Fatty Acids (FISH OIL) 1000 MG CAPS  Take by mouth daily.    Marland Kitchen omeprazole (PRILOSEC) 20 MG capsule TAKE 1 CAPSULE EVERY DAY 90 capsule 3  . potassium chloride SA (K-DUR,KLOR-CON) 20 MEQ tablet TAKE 1 TABLET EVERY DAY 90 tablet 0  . ranitidine (ZANTAC) 150 MG tablet TAKE 1 TABLET AT BEDTIME 90 tablet 3  . rosuvastatin (CRESTOR) 40 MG tablet TAKE 1 TABLET EVERY DAY (NEED MD APPOINTMENT) 90 tablet 0   No current facility-administered medications for this visit.      Past Medical History:  Diagnosis Date  . Anemia, unspecified   . Anxiety state, unspecified   . Coronary atherosclerosis   . GERD (gastroesophageal reflux disease)   . Osteoarthritis    group B streptococcal septic arthritis  . Unspecified essential hypertension     History reviewed. No pertinent surgical history.  Social History   Socioeconomic History  . Marital status: Divorced    Spouse name: Not on file  . Number of children: 3  . Years of education: Not on file  . Highest education level: Some college, no degree  Occupational History  . Occupation: retired  Scientific laboratory technician  . Financial resource strain: Not hard at all  . Food insecurity:    Worry: Never true    Inability: Never true  . Transportation needs:    Medical: No    Non-medical:  No  Tobacco Use  . Smoking status: Never Smoker  . Smokeless tobacco: Never Used  Substance and Sexual Activity  . Alcohol use: Yes    Comment: drinks a beer occasionally  . Drug use: No  . Sexual activity: Not on file  Lifestyle  . Physical activity:    Days per week: 0 days    Minutes per session: 0 min  . Stress: Only a little  Relationships  . Social connections:    Talks on phone: Patient refused    Gets together: Patient refused    Attends religious service: Patient refused    Active member of club or organization: Patient refused    Attends meetings of clubs or organizations: Patient refused    Relationship status: Patient refused  . Intimate partner violence:    Fear of current or ex  partner: Patient refused    Emotionally abused: Patient refused    Physically abused: Patient refused    Forced sexual activity: Patient refused  Other Topics Concern  . Not on file  Social History Narrative  . Not on file    Family History  Problem Relation Age of Onset  . Parkinson's disease Mother   . Heart disease Mother   . Dementia Mother   . Cancer Father        lung cancer, smoker  . Depression Sister   . Fibromyalgia Sister   . Arthritis Brother        B/L knee replacement  . Sleep apnea Brother     ROS: back pain but no fevers or chills, productive cough, hemoptysis, dysphasia, odynophagia, melena, hematochezia, dysuria, hematuria, rash, seizure activity, orthopnea, PND, pedal edema, claudication. Remaining systems are negative.  Physical Exam: Well-developed well-nourished in no acute distress.  Skin is warm and dry.  HEENT is normal.  Neck is supple.  Chest is clear to auscultation with normal expansion.  Cardiovascular exam is regular rate and rhythm.  Abdominal exam nontender or distended. No masses palpated. Extremities show no edema. neuro grossly intact  A/P  1 coronary artery disease status post coronary artery bypass graft-patient continues to do well from a symptomatic standpoint.  Plan to continue medical therapy with aspirin and statin.  2 hypertension-blood pressure is controlled.  Continue present medications.  3 hyperlipidemia-continue statin.  Kirk Ruths, MD

## 2018-09-21 NOTE — Telephone Encounter (Signed)
Please advise 

## 2018-09-22 NOTE — Telephone Encounter (Signed)
[place] for old dose.

## 2018-09-28 DIAGNOSIS — X32XXXA Exposure to sunlight, initial encounter: Secondary | ICD-10-CM | POA: Diagnosis not present

## 2018-09-28 DIAGNOSIS — Z08 Encounter for follow-up examination after completed treatment for malignant neoplasm: Secondary | ICD-10-CM | POA: Diagnosis not present

## 2018-09-28 DIAGNOSIS — B078 Other viral warts: Secondary | ICD-10-CM | POA: Diagnosis not present

## 2018-09-28 DIAGNOSIS — C44329 Squamous cell carcinoma of skin of other parts of face: Secondary | ICD-10-CM | POA: Diagnosis not present

## 2018-09-28 DIAGNOSIS — Z85828 Personal history of other malignant neoplasm of skin: Secondary | ICD-10-CM | POA: Diagnosis not present

## 2018-09-28 DIAGNOSIS — D2262 Melanocytic nevi of left upper limb, including shoulder: Secondary | ICD-10-CM | POA: Diagnosis not present

## 2018-09-28 DIAGNOSIS — D2261 Melanocytic nevi of right upper limb, including shoulder: Secondary | ICD-10-CM | POA: Diagnosis not present

## 2018-09-28 DIAGNOSIS — D2272 Melanocytic nevi of left lower limb, including hip: Secondary | ICD-10-CM | POA: Diagnosis not present

## 2018-09-28 DIAGNOSIS — L570 Actinic keratosis: Secondary | ICD-10-CM | POA: Diagnosis not present

## 2018-09-28 DIAGNOSIS — D485 Neoplasm of uncertain behavior of skin: Secondary | ICD-10-CM | POA: Diagnosis not present

## 2018-09-30 ENCOUNTER — Encounter: Payer: Self-pay | Admitting: Cardiology

## 2018-09-30 ENCOUNTER — Ambulatory Visit: Payer: Medicare HMO | Admitting: Cardiology

## 2018-09-30 VITALS — BP 124/66 | HR 69 | Ht 68.0 in | Wt 211.0 lb

## 2018-09-30 DIAGNOSIS — I2510 Atherosclerotic heart disease of native coronary artery without angina pectoris: Secondary | ICD-10-CM | POA: Diagnosis not present

## 2018-09-30 DIAGNOSIS — I10 Essential (primary) hypertension: Secondary | ICD-10-CM | POA: Diagnosis not present

## 2018-09-30 DIAGNOSIS — E7800 Pure hypercholesterolemia, unspecified: Secondary | ICD-10-CM | POA: Diagnosis not present

## 2018-09-30 NOTE — Patient Instructions (Signed)

## 2018-10-04 ENCOUNTER — Other Ambulatory Visit: Payer: Self-pay | Admitting: Cardiology

## 2018-11-10 DIAGNOSIS — C44329 Squamous cell carcinoma of skin of other parts of face: Secondary | ICD-10-CM | POA: Diagnosis not present

## 2018-11-12 ENCOUNTER — Other Ambulatory Visit: Payer: Self-pay | Admitting: Cardiology

## 2018-11-12 DIAGNOSIS — I25810 Atherosclerosis of coronary artery bypass graft(s) without angina pectoris: Secondary | ICD-10-CM

## 2018-11-15 NOTE — Telephone Encounter (Signed)
Rx request sent to pharmacy.  

## 2018-12-02 ENCOUNTER — Emergency Department
Admission: EM | Admit: 2018-12-02 | Discharge: 2018-12-02 | Disposition: A | Discharge status: Home / Self Care | Payer: Medicare HMO | Attending: Emergency Medicine | Admitting: Emergency Medicine

## 2018-12-02 ENCOUNTER — Other Ambulatory Visit: Payer: Self-pay

## 2018-12-02 ENCOUNTER — Encounter: Payer: Self-pay | Admitting: Emergency Medicine

## 2018-12-02 ENCOUNTER — Emergency Department: Payer: Medicare HMO

## 2018-12-02 DIAGNOSIS — Y939 Activity, unspecified: Secondary | ICD-10-CM | POA: Diagnosis not present

## 2018-12-02 DIAGNOSIS — S0101XA Laceration without foreign body of scalp, initial encounter: Secondary | ICD-10-CM | POA: Insufficient documentation

## 2018-12-02 DIAGNOSIS — Y929 Unspecified place or not applicable: Secondary | ICD-10-CM | POA: Insufficient documentation

## 2018-12-02 DIAGNOSIS — Z79899 Other long term (current) drug therapy: Secondary | ICD-10-CM | POA: Insufficient documentation

## 2018-12-02 DIAGNOSIS — S0990XA Unspecified injury of head, initial encounter: Secondary | ICD-10-CM | POA: Diagnosis not present

## 2018-12-02 DIAGNOSIS — R42 Dizziness and giddiness: Secondary | ICD-10-CM

## 2018-12-02 DIAGNOSIS — W19XXXA Unspecified fall, initial encounter: Secondary | ICD-10-CM | POA: Diagnosis not present

## 2018-12-02 DIAGNOSIS — Y999 Unspecified external cause status: Secondary | ICD-10-CM | POA: Diagnosis not present

## 2018-12-02 DIAGNOSIS — I10 Essential (primary) hypertension: Secondary | ICD-10-CM | POA: Insufficient documentation

## 2018-12-02 DIAGNOSIS — R270 Ataxia, unspecified: Secondary | ICD-10-CM | POA: Diagnosis not present

## 2018-12-02 LAB — TROPONIN I: Troponin I: <0.03 ng/mL (ref <0.03)

## 2018-12-02 LAB — BASIC METABOLIC PANEL
Anion gap: 9 (ref 5–15)
BUN: 23 mg/dL (ref 8–23)
CO2: 23 mmol/L (ref 22–32)
Calcium: 9.5 mg/dL (ref 8.9–10.3)
Chloride: 108 mmol/L (ref 98–111)
Creatinine, Ser: 1.32 mg/dL — ABNORMAL HIGH (ref 0.61–1.24)
GFR calc Af Amer: 57 mL/min — ABNORMAL LOW (ref >60)
GFR calc non Af Amer: 50 mL/min — ABNORMAL LOW (ref >60)
Glucose, Bld: 122 mg/dL — ABNORMAL HIGH (ref 70–99)
Potassium: 4.1 mmol/L (ref 3.5–5.1)
Sodium: 140 mmol/L (ref 135–145)

## 2018-12-02 MED ORDER — SODIUM CHLORIDE 0.9% FLUSH
3.0000 mL | Freq: Once | INTRAVENOUS | Status: AC
  Administered 2018-12-02: 3 mL via INTRAVENOUS

## 2018-12-02 MED ORDER — LIDOCAINE-EPINEPHRINE 2 %-1:100000 IJ SOLN
20.0000 mL | Freq: Once | INTRAMUSCULAR | Status: AC
  Administered 2018-12-02: 5 mL via INTRADERMAL
  Filled 2018-12-02: qty 1

## 2018-12-02 NOTE — ED Provider Notes (Signed)
[facility] Emergency Department Provider Note   ____________________________________________    I have reviewed the triage vital signs and the nursing notes.   HISTORY  Chief Complaint Fall injury    HPI [name] is a 85 y.o. male with a history of vertigo presents after a fall.  Patient reports he got up this morning and was in the bathroom and turned around to get something when he felt like the room was spinning and he lost his balance and recognize that he was falling.  He thinks he hit his head on the bathroom scale.  Is not clear whether he had an LOC.  No blood thinners.  Complains only of right frontal forehead pain/laceration, no other injuries reported.  No chest pain nausea vomiting or shortness of breath.  No abdominal pain  Past Medical History:  Diagnosis Date  . Anemia, unspecified   . Anxiety state, unspecified   . Coronary atherosclerosis   . GERD (gastroesophageal reflux disease)   . Osteoarthritis    group B streptococcal septic arthritis  . Unspecified essential hypertension     Patient Active Problem List   Diagnosis Date Noted  . Arthropathy of hand 06/11/2015  . Arteriosclerosis of coronary artery 06/11/2015  . Impotence of organic origin 06/11/2015  . Generalized anxiety disorder 06/11/2015  . Acid reflux 06/11/2015  . History of other malignant neoplasm of skin 06/11/2015  . HLD (hyperlipidemia) 06/11/2015  . Arthritis, degenerative 06/11/2015  . Adiposity 06/11/2015  . Venous insufficiency of leg 06/11/2015  . HYPERLIPIDEMIA 08/16/2009  . ANEMIA 08/16/2009  . ANXIETY 08/16/2009  . GERD 08/16/2009  . OSTEOARTHRITIS 08/16/2009  . Umbilical hernia without obstruction and without gangrene 03/28/2008  . Essential hypertension 10/20/2006  . Coronary atherosclerosis 10/20/2006  . CARDIAC ARRHYTHMIA 10/20/2006  . SEPTIC ARTHRITIS 10/20/2006    History reviewed. No pertinent surgical history.  Prior to  Admission medications   Medication Sig Start Date End Date Taking? Authorizing Provider  amLODipine (NORVASC) 10 MG tablet TAKE 1 TABLET EVERY EVENING 06/01/18   Jerrol Banana., MD  aspirin 81 MG tablet Take 81 mg by mouth daily.      [provider]  Cholecalciferol (VITAMIN D3) 2000 units TABS Take 1 tablet by mouth daily.    [provider]  co-enzyme Q-10 30 MG capsule Take 30 mg by mouth daily.    [provider]  dorzolamide (TRUSOPT) 2 % ophthalmic solution Place 1 drop 2 (two) times daily into both eyes.  12/21/13   [provider]  doxazosin (CARDURA) 2 MG tablet TAKE 1 TABLET EVERY DAY 06/01/18   Jerrol Banana., MD  fluticasone [facility]) 50 MCG/ACT nasal spray Place 2 sprays into both nostrils daily. 05/03/18   Jerrol Banana., MD  ibuprofen (ADVIL,MOTRIN) 200 MG tablet Take 200 mg by mouth daily as needed for moderate pain.    [provider]  labetalol (NORMODYNE) 200 MG tablet TAKE 1 TABLET TWICE DAILY 06/01/18   Jerrol Banana., MD  latanoprost (XALATAN) 0.005 % ophthalmic solution 1 drop at bedtime.      [provider]  loratadine (CLARITIN) 10 MG tablet Take 1 tablet (10 mg total) by mouth daily. 04/29/16   Jerrol Banana., MD  LORazepam (ATIVAN) 0.5 MG tablet TAKE 1 TABLET AT BEDTIME AS NEEDED FOR SLEEP 09/14/18   Jerrol Banana., MD  losartan-hydrochlorothiazide [facility]) 100-25 MG tablet TAKE 1 TABLET EVERY DAY 08/17/18  Jerrol Banana., MD  meclizine (ANTIVERT) 25 MG tablet Take 1 tablet (25 mg total) by mouth daily as needed for dizziness. 09/16/18   Jerrol Banana., MD  Omega-3 Fatty Acids (FISH OIL) 1000 MG CAPS Take by mouth daily.    [provider]  omeprazole (PRILOSEC) 20 MG capsule TAKE 1 CAPSULE EVERY DAY 03/18/18   Jerrol Banana., MD  potassium chloride SA (K-DUR,KLOR-CON) 20 MEQ tablet TAKE 1 TABLET EVERY DAY 10/05/18   Lelon Perla, MD    ranitidine (ZANTAC) 150 MG tablet TAKE 1 TABLET AT BEDTIME 06/01/18   Jerrol Banana., MD  rosuvastatin (CRESTOR) 40 MG tablet Take 1 tablet (40 mg total) by mouth daily. 11/15/18   Lelon Perla, MD     Allergies Patient has no known allergies.  Family History  Problem Relation Age of Onset  . Parkinson's disease Mother   . Heart disease Mother   . Dementia Mother   . Cancer Father        lung cancer, smoker  . Depression Sister   . Fibromyalgia Sister   . Arthritis Brother        B/L knee replacement  . Sleep apnea Brother     Social History Social History   Tobacco Use  . Smoking status: Never Smoker  . Smokeless tobacco: Never Used  Substance Use Topics  . Alcohol use: Yes    Comment: drinks a beer occasionally  . Drug use: No    Review of Systems  Constitutional: No fever/chills Eyes: No visual changes.  ENT: No neck pain Cardiovascular: Denies chest pain. Respiratory: Denies shortness of breath. Gastrointestinal: No abdominal pain.  No nausea, no vomiting.   Genitourinary: Negative for dysuria. Musculoskeletal: Negative for back pain. Skin: Laceration to the scalp Neurological: As above, no weakness   ____________________________________________   PHYSICAL EXAM:  VITAL SIGNS: ED Triage Vitals  Enc Vitals Group     BP 12/02/18 0856 136/86     Pulse Rate 12/02/18 0856 79     Resp 12/02/18 0856 16     Temp 12/02/18 0856 97.6 F (36.4 C)     Temp Source 12/02/18 0856 Oral     SpO2 12/02/18 0856 95 %     Weight 12/02/18 0857 92.5 kg (204 lb)     Height 12/02/18 0857 1.727 m (5\' 8" )     Head Circumference --      Peak Flow --      Pain Score 12/02/18 0854 4     Pain Loc --      Pain Edu? --      Excl. in [place]? --     Constitutional: Alert and oriented. No acute distress. Pleasant and interactive Eyes: Conjunctivae are normal.  Head: 3 cm laceration to the right frontal scalp Nose: No swelling epistaxis Mouth/Throat: Mucous membranes  are moist.   Neck:  Painless ROM no vertebral test palpation Cardiovascular: Normal rate, regular rhythm. Grossly normal heart sounds.  Good peripheral circulation. Respiratory: Normal respiratory effort.  No retractions. Lungs CTAB. Gastrointestinal: Soft and nontender. No distention.    Musculoskeletal: No pain with axial load on both hips.  No vertebral tenderness to palpation.  No chest wall tenderness to palpation.  Warm and well perfused Neurologic:  Normal speech and language. No gross focal neurologic deficits are appreciated.  Skin:  Skin is warm, dry and intact. No rash noted. Psychiatric: Mood and affect are normal. Speech and behavior are normal.  ____________________________________________   LABS (all labs ordered are listed, but only abnormal results are displayed)  Labs Reviewed  BASIC METABOLIC PANEL - Abnormal; Notable for the following components:      Result Value   Glucose, Bld 122 (*)    Creatinine, Ser 1.32 (*)    GFR calc non Af Amer 50 (*)    GFR calc Af Amer 57 (*)    All other components within normal limits  TROPONIN I  URINALYSIS, COMPLETE (UACMP) WITH MICROSCOPIC  CBG MONITORING, ED   ____________________________________________  EKG  ED ECG REPORT I, Lavonia Drafts, the attending physician, personally viewed and interpreted this ECG.  Date: 12/02/2018  Rhythm: normal sinus rhythm QRS Axis: normal Intervals: Abnormal ST/T Wave abnormalities: None specific Narrative Interpretation: PVC noted  ____________________________________________  RADIOLOGY  CT head ____________________________________________   PROCEDURES  Procedure(s) performed: yes  .Marland KitchenLaceration Repair Date/Time: 12/02/2018 11:08 AM Performed by: Lavonia Drafts, MD Authorized by: Lavonia Drafts, MD   Consent:    Consent obtained:  Verbal   Consent given by:  Patient   Risks discussed:  Infection, pain, retained foreign body, poor cosmetic result and poor wound  healing Anesthesia (see MAR for exact dosages):    Anesthesia method:  Local infiltration   Local anesthetic:  Lidocaine 1% w/o epi Laceration details:    Location:  Scalp   Scalp location:  Frontal   Length (cm):  3 Repair type:    Repair type:  Simple Pre-procedure details:    Preparation:  Patient was prepped and draped in usual sterile fashion Exploration:    Hemostasis achieved with:  Direct pressure   Wound exploration: entire depth of wound probed and visualized     Contaminated: no   Treatment:    Area cleansed with:  Saline   Amount of cleaning:  Extensive   Irrigation solution:  Sterile saline   Visualized foreign bodies/material removed: no   Skin repair:    Repair method:  Sutures   Suture size:  5-0   Suture material:  Nylon   Suture technique:  Simple interrupted Approximation:    Approximation:  Close Post-procedure details:    Dressing:  Sterile dressing and adhesive bandage   Patient tolerance of procedure:  Tolerated well, no immediate complications     Critical Care performed: No ____________________________________________   INITIAL IMPRESSION / ASSESSMENT AND PLAN / ED COURSE  Pertinent labs & imaging results that were available during my care of the patient were reviewed by me and considered in my medical decision making (see chart for details).  Patient presents after likely episode of vertigo with fall, laceration to the scalp may require suturing.  Pending CT head, labs currently feels well, no vertigo at this time  CT reassuring, troponin BMP unremarkable laceration repaired.  Appropriate for discharge this time.  Tetanus is up-to-date    ____________________________________________   FINAL CLINICAL IMPRESSION(S) / ED DIAGNOSES  Final diagnoses:  Vertigo  Fall, initial encounter  Injury of head, initial encounter  Laceration of scalp, initial encounter        Note:  This document was prepared using Dragon voice recognition  software and may include unintentional dictation errors.   Lavonia Drafts, MD 12/02/18 [phone]

## 2018-12-02 NOTE — ED Triage Notes (Signed)
Pt was in bathroom this am, states the room started going around and then he blacked out, states he was out for a split second, lac noted to right forehead with slight ooze of blood, pt states pain only where the lac is, son states pt has hx of vertigo, took meclizine this am prior to the fall, pt denies blood thinners. NAD.

## 2018-12-07 ENCOUNTER — Telehealth: Payer: Self-pay | Admitting: Family Medicine

## 2018-12-07 NOTE — Telephone Encounter (Signed)
Pt needing to know if the form he dropped off for handicap access was completed.  He wants to stop by to get his stitches taken out.  He was told he needs an appt.  He is still wanting to talk to a nurse.  Please advise.  Thanks, [employer]

## 2018-12-08 NOTE — Telephone Encounter (Signed)
Patient came to the office and spoke with Dr Rosanna Randy

## 2018-12-08 NOTE — Telephone Encounter (Signed)
LMTCB ED 

## 2018-12-27 DIAGNOSIS — H40153 Residual stage of open-angle glaucoma, bilateral: Secondary | ICD-10-CM | POA: Diagnosis not present

## 2018-12-29 ENCOUNTER — Other Ambulatory Visit: Payer: Self-pay | Admitting: Family Medicine

## 2019-01-25 ENCOUNTER — Telehealth: Payer: Self-pay | Admitting: Family Medicine

## 2019-01-25 NOTE — Telephone Encounter (Signed)
Bekim got a letter from [facility] saying they would no longer have Ranitidine.  However, he takes Omeprazole also.   Should he be on both?

## 2019-01-25 NOTE — Telephone Encounter (Signed)
Please review. Received a fax about this also.

## 2019-01-26 NOTE — Telephone Encounter (Signed)
Advised patient as below.  

## 2019-01-26 NOTE — Telephone Encounter (Signed)
Left message to call back  

## 2019-01-26 NOTE — Telephone Encounter (Signed)
Just stop the ranitidine.  Stay on omeprazole.

## 2019-02-04 ENCOUNTER — Telehealth: Payer: Self-pay | Admitting: Family Medicine

## 2019-02-04 NOTE — Telephone Encounter (Signed)
Pt was prescribed Pantoprazole 20 mg.  Recently and he has been reading about this medication and does not know whether he wants to take it or not.  He has been taking the Omeprazole 20 mg and he said it is working fine  Please advise  Thanks  teri

## 2019-02-08 NOTE — Telephone Encounter (Signed)
yes

## 2019-02-08 NOTE — Telephone Encounter (Signed)
Left message to call back  

## 2019-02-08 NOTE — Telephone Encounter (Signed)
Ok to take the omeprazole instead of the pantoprazole?

## 2019-02-08 NOTE — Telephone Encounter (Signed)
Advised 

## 2019-02-09 DIAGNOSIS — L820 Inflamed seborrheic keratosis: Secondary | ICD-10-CM | POA: Diagnosis not present

## 2019-02-09 DIAGNOSIS — C4442 Squamous cell carcinoma of skin of scalp and neck: Secondary | ICD-10-CM | POA: Diagnosis not present

## 2019-02-09 DIAGNOSIS — D485 Neoplasm of uncertain behavior of skin: Secondary | ICD-10-CM | POA: Diagnosis not present

## 2019-02-09 DIAGNOSIS — L570 Actinic keratosis: Secondary | ICD-10-CM | POA: Diagnosis not present

## 2019-02-09 DIAGNOSIS — L298 Other pruritus: Secondary | ICD-10-CM | POA: Diagnosis not present

## 2019-02-09 DIAGNOSIS — D0439 Carcinoma in situ of skin of other parts of face: Secondary | ICD-10-CM | POA: Diagnosis not present

## 2019-02-09 DIAGNOSIS — X32XXXA Exposure to sunlight, initial encounter: Secondary | ICD-10-CM | POA: Diagnosis not present

## 2019-02-09 DIAGNOSIS — Z85828 Personal history of other malignant neoplasm of skin: Secondary | ICD-10-CM | POA: Diagnosis not present

## 2019-02-09 DIAGNOSIS — Z08 Encounter for follow-up examination after completed treatment for malignant neoplasm: Secondary | ICD-10-CM | POA: Diagnosis not present

## 2019-02-09 DIAGNOSIS — L538 Other specified erythematous conditions: Secondary | ICD-10-CM | POA: Diagnosis not present

## 2019-02-23 ENCOUNTER — Other Ambulatory Visit: Payer: Self-pay

## 2019-02-23 DIAGNOSIS — R42 Dizziness and giddiness: Secondary | ICD-10-CM

## 2019-02-23 MED ORDER — FLUTICASONE PROPIONATE 50 MCG/ACT NA SUSP: 2.0000 | Freq: Every day | NASAL | 6 refills | Status: DC

## 2019-02-28 DIAGNOSIS — C4442 Squamous cell carcinoma of skin of scalp and neck: Secondary | ICD-10-CM | POA: Diagnosis not present

## 2019-03-11 ENCOUNTER — Other Ambulatory Visit: Payer: Self-pay | Admitting: Family Medicine

## 2019-03-11 DIAGNOSIS — G4700 Insomnia, unspecified: Secondary | ICD-10-CM

## 2019-03-11 NOTE — Telephone Encounter (Signed)
Please review. Thanks!  

## 2019-03-11 NOTE — Telephone Encounter (Signed)
[facility] faxed refill request for the following medications:  LORazepam (ATIVAN) 0.5 MG tablet  Please advise. 

## 2019-03-15 ENCOUNTER — Other Ambulatory Visit: Payer: Self-pay

## 2019-03-15 ENCOUNTER — Ambulatory Visit (INDEPENDENT_AMBULATORY_CARE_PROVIDER_SITE_OTHER): Payer: Medicare HMO | Admitting: Family Medicine

## 2019-03-15 ENCOUNTER — Encounter: Payer: Self-pay | Admitting: Family Medicine

## 2019-03-15 VITALS — BP 122/66 | HR 72 | Temp 98.6°F | Resp 16 | Ht 68.0 in | Wt 214.0 lb

## 2019-03-15 DIAGNOSIS — E785 Hyperlipidemia, unspecified: Secondary | ICD-10-CM | POA: Diagnosis not present

## 2019-03-15 DIAGNOSIS — I25810 Atherosclerosis of coronary artery bypass graft(s) without angina pectoris: Secondary | ICD-10-CM

## 2019-03-15 DIAGNOSIS — K219 Gastro-esophageal reflux disease without esophagitis: Secondary | ICD-10-CM | POA: Diagnosis not present

## 2019-03-15 DIAGNOSIS — I10 Essential (primary) hypertension: Secondary | ICD-10-CM | POA: Diagnosis not present

## 2019-03-15 DIAGNOSIS — G4700 Insomnia, unspecified: Secondary | ICD-10-CM

## 2019-03-15 MED ORDER — LORAZEPAM 0.5 MG PO TABS: 0.5000 mg | ORAL_TABLET | Freq: Every evening | ORAL | 5 refills | Status: DC | PRN

## 2019-03-15 NOTE — Progress Notes (Signed)
Patient: [name] Male    DOB: April 15, 1933   85 y.o.   MRN: [id number] Visit Date: 03/15/2019  Today's Provider: [name], MD   Chief Complaint  Patient presents with  . Coronary Artery Disease  . Hypertension  . Hyperlipidemia  . Gastroesophageal Reflux  . Abdominal Pain   Subjective:     HPI  Hypertension, follow-up:  BP Readings from Last 3 Encounters:  03/15/19 122/66  12/02/18 136/86  09/30/18 124/66    He was last seen for hypertension 6 months ago.  BP at that visit was 124/66. Management since that visit includes no changes. He reports good compliance with treatment. He is not having side effects.  He is not exercising. He is adherent to low salt diet.   Outside blood pressures are checked occasionally. He is experiencing none.  Patient denies chest pain, exertional chest pressure/discomfort and lower extremity edema.   Cardiovascular risk factors include dyslipidemia.   Weight trend: stable Wt Readings from Last 3 Encounters:  03/15/19 214 lb (97.1 kg)  12/02/18 204 lb (92.5 kg)  09/30/18 211 lb (95.7 kg)    Current diet: well balanced    Lipid/Cholesterol, Follow-up:   Last seen for this6 months ago.  Management changes since that visit include no changes. . Last Lipid Panel:    Component Value Date/Time   CHOL 99 (L) 09/13/2018 0828   TRIG 71 09/13/2018 0828   HDL 41 09/13/2018 0828   CHOLHDL 2.4 09/13/2018 0828   CHOLHDL 2.4 09/02/2017 0856   LDLCALC 44 09/13/2018 0828   LDLCALC 40 09/02/2017 0856    Risk factors for vascular disease include hypertension  He reports good compliance with treatment. He is not having side effects.  Current symptoms include none and have been stable.   GERD, follow up: Patient was last seen in the office 6 months ago. Since last visit, he was advised to discontinue Rantidine 150mg  daily. He reports that he has been taking omeprazole 20mg  daily, and reports good symptom control.   Abdominal pain: Patient reports that he has a pain in the center of his abdomen that has worsened over the last several months. He reports that the pain comes and goes. He has not noticed any changes in bowels. He has not changed his diet. He denies any injuries.  The pain is actually a tenderness in his upper  epigastrium that he has had when he presses hard for several years.  No Known Allergies   Current Outpatient Medications:  .  amLODipine (NORVASC) 10 MG tablet, TAKE 1 TABLET EVERY EVENING, Disp: 90 tablet, Rfl: 3 .  aspirin 81 MG tablet, Take 81 mg by mouth daily.  , Disp: , Rfl:  .  Cholecalciferol (VITAMIN D3) 2000 units TABS, Take 1 tablet by mouth daily., Disp: , Rfl:  .  co-enzyme Q-10 30 MG capsule, Take 30 mg by mouth daily., Disp: , Rfl:  .  dorzolamide (TRUSOPT) 2 % ophthalmic solution, Place 1 drop 2 (two) times daily into both eyes. , Disp: , Rfl:  .  doxazosin (CARDURA) 2 MG tablet, TAKE 1 TABLET EVERY DAY, Disp: 90 tablet, Rfl: 3 .  fluticasone (FLONASE) 50 MCG/ACT nasal spray, Place 2 sprays into both nostrils daily., Disp: 16 g, Rfl: 6 .  ibuprofen (ADVIL,MOTRIN) 200 MG tablet, Take 200 mg by mouth daily as needed for moderate pain., Disp: , Rfl:  .  labetalol (NORMODYNE) 200 MG tablet, TAKE 1 TABLET TWICE DAILY,  Disp: 180 tablet, Rfl: 3 .  latanoprost (XALATAN) 0.005 % ophthalmic solution, 1 drop at bedtime.  , Disp: , Rfl:  .  loratadine (CLARITIN) 10 MG tablet, Take 1 tablet (10 mg total) by mouth daily., Disp: 30 tablet, Rfl: 11 .  LORazepam (ATIVAN) 0.5 MG tablet, Take 1 tablet (0.5 mg total) by mouth at bedtime as needed. for sleep, Disp: 30 tablet, Rfl: 5 .  losartan-hydrochlorothiazide (HYZAAR) 100-25 MG tablet, TAKE 1 TABLET EVERY DAY, Disp: 90 tablet, Rfl: 3 .  meclizine (ANTIVERT) 25 MG tablet, Take 1 tablet (25 mg total) by mouth daily as needed for dizziness., Disp: 90 tablet, Rfl: 3 .  Omega-3 Fatty Acids (FISH OIL) 1000 MG CAPS, Take by mouth daily., Disp:  , Rfl:  .  omeprazole (PRILOSEC) 20 MG capsule, TAKE 1 CAPSULE EVERY DAY, Disp: 90 capsule, Rfl: 3 .  potassium chloride SA (K-DUR,KLOR-CON) 20 MEQ tablet, TAKE 1 TABLET EVERY DAY, Disp: 90 tablet, Rfl: 3 .  rosuvastatin (CRESTOR) 40 MG tablet, Take 1 tablet (40 mg total) by mouth daily., Disp: 90 tablet, Rfl: 1 .  ranitidine (ZANTAC) 150 MG tablet, TAKE 1 TABLET AT BEDTIME (Patient not taking: Reported on 03/15/2019), Disp: 90 tablet, Rfl: 3  Review of Systems  Constitutional: Negative for activity change, chills, diaphoresis, fatigue, fever and unexpected weight change.  Eyes: Negative.   Respiratory: Negative for cough and shortness of breath.   Cardiovascular: Negative for chest pain, palpitations and leg swelling.  Gastrointestinal: Positive for abdominal pain.  Endocrine: Negative.   Allergic/Immunologic: Negative.   Neurological: Negative for dizziness, light-headedness and headaches.  Hematological: Negative.   Psychiatric/Behavioral: Negative for self-injury, sleep disturbance and suicidal ideas. The patient is not nervous/anxious.     Social History   Tobacco Use  . Smoking status: Never Smoker  . Smokeless tobacco: Never Used  Substance Use Topics  . Alcohol use: Yes    Comment: drinks a beer occasionally      Objective:   BP 122/66 (BP Location: Right Arm, Patient Position: Sitting, Cuff Size: Large)   Pulse 72   Temp 98.6 F (37 C)   Resp 16   Ht 5\' 8"  (1.727 m)   Wt 214 lb (97.1 kg)   SpO2 98%   BMI 32.54 kg/m  Vitals:   03/15/19 1332  BP: 122/66  Pulse: 72  Resp: 16  Temp: 98.6 F (37 C)  SpO2: 98%  Weight: 214 lb (97.1 kg)  Height: 5\' 8"  (1.727 m)     Physical Exam Vitals signs reviewed.  Constitutional:      Appearance: He is well-developed.  HENT:     Head: Normocephalic and atraumatic.     Right Ear: External ear normal.     Left Ear: External ear normal.  Eyes:     General: No scleral icterus.    Conjunctiva/sclera: Conjunctivae  normal.  Neck:     Thyroid: No thyromegaly.  Cardiovascular:     Rate and Rhythm: Normal rate and regular rhythm.     Heart sounds: Normal heart sounds.  Pulmonary:     Effort: Pulmonary effort is normal.     Breath sounds: Normal breath sounds.  Abdominal:     Palpations: Abdomen is soft.     Comments: The area the patient states is tender when he presses hard his area of the xiphoid process.  We have discussed that at length in the past.  Skin:    General: Skin is warm and dry.  Comments: Many SKs.  Neurological:     Mental Status: He is alert and oriented to person, place, and time.  Psychiatric:        Behavior: Behavior normal.        Thought Content: Thought content normal.        Judgment: Judgment normal.         Assessment & Plan    1. Essential hypertension Controlled on amlodipine and labetalol  2. Hyperlipidemia, unspecified hyperlipidemia type On rosuvastatin.  3. Gastroesophageal reflux disease, esophagitis presence not specified Controlled, try every other day omeprazole.  He is now off of Zantac. More than 50% of 25-minute visit spent in counseling and coordination of care 4. Atherosclerosis of coronary artery bypass graft of native heart without angina pectoris All risk factors treated.  Post CABG 1997    I have done the exam and reviewed the above chart and it is accurate to the best of my knowledge. Development worker, community has been used in this note in any air is in the dictation or transcription are unintentional.  [name], MD  [facility] [place]

## 2019-03-15 NOTE — Patient Instructions (Signed)
Take Omeprazole every other day.

## 2019-03-15 NOTE — Telephone Encounter (Signed)
Please review

## 2019-04-04 ENCOUNTER — Other Ambulatory Visit: Payer: Self-pay | Admitting: Cardiology

## 2019-04-04 DIAGNOSIS — I25810 Atherosclerosis of coronary artery bypass graft(s) without angina pectoris: Secondary | ICD-10-CM

## 2019-04-05 ENCOUNTER — Other Ambulatory Visit: Payer: Self-pay | Admitting: Family Medicine

## 2019-04-12 MED ORDER — ROSUVASTATIN CALCIUM 40 MG PO TABS: 40.0000 mg | ORAL_TABLET | Freq: Every day | ORAL | 1 refills | Status: DC

## 2019-04-12 NOTE — Telephone Encounter (Signed)
Patient called upset that his medication wasn't refilled.  rosuvastatin (CRESTOR) 40 MG tablet

## 2019-04-12 NOTE — Addendum Note (Signed)
Addended by: Zebedee Iba on: 04/12/2019 03:28 PM   Modules accepted: Orders

## 2019-04-28 DIAGNOSIS — H40153 Residual stage of open-angle glaucoma, bilateral: Secondary | ICD-10-CM | POA: Diagnosis not present

## 2019-06-01 DIAGNOSIS — X32XXXA Exposure to sunlight, initial encounter: Secondary | ICD-10-CM | POA: Diagnosis not present

## 2019-06-01 DIAGNOSIS — C44729 Squamous cell carcinoma of skin of left lower limb, including hip: Secondary | ICD-10-CM | POA: Diagnosis not present

## 2019-06-01 DIAGNOSIS — L570 Actinic keratosis: Secondary | ICD-10-CM | POA: Diagnosis not present

## 2019-06-01 DIAGNOSIS — L538 Other specified erythematous conditions: Secondary | ICD-10-CM | POA: Diagnosis not present

## 2019-06-01 DIAGNOSIS — Z85828 Personal history of other malignant neoplasm of skin: Secondary | ICD-10-CM | POA: Diagnosis not present

## 2019-06-01 DIAGNOSIS — D485 Neoplasm of uncertain behavior of skin: Secondary | ICD-10-CM | POA: Diagnosis not present

## 2019-06-01 DIAGNOSIS — Z08 Encounter for follow-up examination after completed treatment for malignant neoplasm: Secondary | ICD-10-CM | POA: Diagnosis not present

## 2019-06-01 DIAGNOSIS — L820 Inflamed seborrheic keratosis: Secondary | ICD-10-CM | POA: Diagnosis not present

## 2019-06-01 DIAGNOSIS — L298 Other pruritus: Secondary | ICD-10-CM | POA: Diagnosis not present

## 2019-06-19 ENCOUNTER — Other Ambulatory Visit: Payer: Self-pay | Admitting: Family Medicine

## 2019-06-19 DIAGNOSIS — R42 Dizziness and giddiness: Secondary | ICD-10-CM

## 2019-07-07 DIAGNOSIS — C44729 Squamous cell carcinoma of skin of left lower limb, including hip: Secondary | ICD-10-CM | POA: Diagnosis not present

## 2019-07-07 DIAGNOSIS — C44629 Squamous cell carcinoma of skin of left upper limb, including shoulder: Secondary | ICD-10-CM | POA: Diagnosis not present

## 2019-07-29 ENCOUNTER — Other Ambulatory Visit: Payer: Self-pay | Admitting: Cardiology

## 2019-08-01 ENCOUNTER — Other Ambulatory Visit: Payer: Self-pay | Admitting: Family Medicine

## 2019-08-01 DIAGNOSIS — R42 Dizziness and giddiness: Secondary | ICD-10-CM

## 2019-08-01 MED ORDER — FLUTICASONE PROPIONATE 50 MCG/ACT NA SUSP: 2.0000 | Freq: Every day | NASAL | 6 refills | Status: DC

## 2019-08-01 NOTE — Telephone Encounter (Signed)
Medication was sent in   

## 2019-08-01 NOTE — Telephone Encounter (Signed)
[facility] faxed refill request for the following medications:  fluticasone (FLONASE) 50 MCG/ACT nasal spray   Please advise.

## 2019-08-08 ENCOUNTER — Other Ambulatory Visit: Payer: Self-pay | Admitting: Family Medicine

## 2019-08-08 DIAGNOSIS — R42 Dizziness and giddiness: Secondary | ICD-10-CM

## 2019-08-08 NOTE — Telephone Encounter (Signed)
[facility] mail in pharmacy needs refill auth on Fluticasone Propionate

## 2019-08-09 NOTE — Telephone Encounter (Signed)
Medication has been sent in to the pharmacy

## 2019-08-23 ENCOUNTER — Other Ambulatory Visit: Payer: Self-pay

## 2019-08-23 DIAGNOSIS — Z20828 Contact with and (suspected) exposure to other viral communicable diseases: Secondary | ICD-10-CM | POA: Diagnosis not present

## 2019-08-23 DIAGNOSIS — Z20822 Contact with and (suspected) exposure to covid-19: Secondary | ICD-10-CM

## 2019-08-25 ENCOUNTER — Other Ambulatory Visit: Payer: Self-pay | Admitting: Cardiology

## 2019-08-25 DIAGNOSIS — I25810 Atherosclerosis of coronary artery bypass graft(s) without angina pectoris: Secondary | ICD-10-CM

## 2019-08-25 LAB — NOVEL CORONAVIRUS, NAA: SARS-CoV-2, NAA: NOT DETECTED

## 2019-08-26 ENCOUNTER — Telehealth: Payer: Self-pay | Admitting: Hematology

## 2019-08-26 ENCOUNTER — Telehealth: Payer: Self-pay

## 2019-08-26 NOTE — Telephone Encounter (Signed)
Called and left message of negative results.

## 2019-08-26 NOTE — Telephone Encounter (Signed)
Pt is aware covid 19 test is neg. Pt had test on 08-23-2019

## 2019-08-26 NOTE — Telephone Encounter (Signed)
-----   Message from Jerrol Banana., MD sent at 08/26/2019  8:37 AM EST ----- No covid detected.

## 2019-09-19 NOTE — Progress Notes (Signed)
Subjective:   [name] is a 85 y.o. male who presents for Medicare Annual/Subsequent preventive examination.    This visit is being conducted through telemedicine due to the COVID-19 pandemic. This patient has given me verbal consent via doximity to conduct this visit, patient states they are participating from their home address. Some vital signs may be absent or patient reported.    Patient identification: identified by name, DOB, and current address  Review of Systems:  N/A  Cardiac Risk Factors include: advanced age (>17men, >85 women);dyslipidemia;hypertension;male gender     Objective:    Vitals: There were no vitals taken for this visit.  There is no height or weight on file to calculate BMI. Unable to obtain vitals due to visit being conducted via telephonically.   Advanced Directives 09/20/2019 12/02/2018 09/15/2018 09/01/2017 08/06/2016 08/06/2016 11/05/2015  Does Patient Have a Medical Advance Directive? Yes Yes Yes Yes Yes Yes Yes  Type of Paramedic of [place];Living will Living will;Healthcare Power of [place];Living will Living will - [place];Living will Long;Living will  Copy of [place] in Chart? No - copy requested - No - copy requested - - No - copy requested -    Tobacco Social History   Tobacco Use  Smoking Status Never Smoker  Smokeless Tobacco Never Used     Counseling given: Not Answered   Clinical Intake:  Pre-visit preparation completed: Yes  Pain : 0-10 Pain Score: 2  Pain Type: Chronic pain Pain Location: Back Pain Orientation: Lower Pain Descriptors / Indicators: Aching Pain Frequency: Intermittent     Nutritional Risks: None Diabetes: No  How often do you need to have someone help you when you read instructions, pamphlets, or other written materials from your doctor or pharmacy?: 1 - Never  Interpreter Needed?:  No  Information entered by :: [facility], LPN  Past Medical History:  Diagnosis Date  . Anemia, unspecified   . Anxiety state, unspecified   . Cancer ([place])    skin  . Coronary atherosclerosis   . GERD (gastroesophageal reflux disease)   . Osteoarthritis    group B streptococcal septic arthritis  . Unspecified essential hypertension    History reviewed. No pertinent surgical history. Family History  Problem Relation Age of Onset  . Parkinson's disease Mother   . Heart disease Mother   . Dementia Mother   . Cancer Father        lung cancer, smoker  . Depression Sister   . Fibromyalgia Sister   . Arthritis Brother        B/L knee replacement  . Sleep apnea Brother    Social History   Socioeconomic History  . Marital status: Divorced    Spouse name: Not on file  . Number of children: 3  . Years of education: Not on file  . Highest education level: Some college, no degree  Occupational History  . Occupation: retired  Scientific laboratory technician  . Financial resource strain: Not hard at all  . Food insecurity    Worry: Never true    Inability: Never true  . Transportation needs    Medical: No    Non-medical: No  Tobacco Use  . Smoking status: Never Smoker  . Smokeless tobacco: Never Used  Substance and Sexual Activity  . Alcohol use: Yes    Comment: drinks a beer occasionally  . Drug use: No  . Sexual activity: Not on file  Lifestyle  . Physical activity    Days per week: 0 days    Minutes per session: 0 min  . Stress: Only a little  Relationships  . Social Herbalist on phone: Patient refused    Gets together: Patient refused    Attends religious service: Patient refused    Active member of club or organization: Patient refused    Attends meetings of clubs or organizations: Patient refused    Relationship status: Patient refused  Other Topics Concern  . Not on file  Social History Narrative  . Not on file    Outpatient Encounter Medications as of  09/20/2019  Medication Sig  . amLODipine (NORVASC) 10 MG tablet TAKE 1 TABLET EVERY EVENING  . aspirin 81 MG tablet Take 81 mg by mouth daily.    . Cholecalciferol (VITAMIN D3) 2000 units TABS Take 1 tablet by mouth daily.  Marland Kitchen co-enzyme Q-10 30 MG capsule Take 30 mg by mouth daily.  . dorzolamide (TRUSOPT) 2 % ophthalmic solution Place 1 drop 2 (two) times daily into both eyes.   Marland Kitchen doxazosin (CARDURA) 2 MG tablet TAKE 1 TABLET EVERY DAY  . fluticasone (FLONASE) 50 MCG/ACT nasal spray Place 2 sprays into both nostrils daily.  Marland Kitchen ibuprofen (ADVIL,MOTRIN) 200 MG tablet Take 200 mg by mouth daily as needed for moderate pain.  Marland Kitchen labetalol (NORMODYNE) 200 MG tablet TAKE 1 TABLET TWICE DAILY  . latanoprost (XALATAN) 0.005 % ophthalmic solution 1 drop at bedtime.    Marland Kitchen loratadine (CLARITIN) 10 MG tablet Take 1 tablet (10 mg total) by mouth daily.  Marland Kitchen LORazepam (ATIVAN) 0.5 MG tablet Take 1 tablet (0.5 mg total) by mouth at bedtime as needed. for sleep  . losartan-hydrochlorothiazide (HYZAAR) 100-25 MG tablet TAKE 1 TABLET EVERY DAY  . meclizine (ANTIVERT) 25 MG tablet TAKE 1 TABLET DAILY AS NEEDED FOR DIZZINESS.  . Omega-3 Fatty Acids (FISH OIL) 1000 MG CAPS Take by mouth daily.  . pantoprazole (PROTONIX) 20 MG tablet Take 20 mg by mouth daily.   . potassium chloride SA (KLOR-CON) 20 MEQ tablet TAKE 1 TABLET EVERY DAY  . rosuvastatin (CRESTOR) 40 MG tablet TAKE 1 TABLET EVERY DAY  . omeprazole (PRILOSEC) 20 MG capsule TAKE 1 CAPSULE EVERY DAY (Patient not taking: Reported on 09/20/2019)  . ranitidine (ZANTAC) 150 MG tablet TAKE 1 TABLET AT BEDTIME (Patient not taking: Reported on 03/15/2019)   No facility-administered encounter medications on file as of 09/20/2019.     Activities of Daily Living In your present state of health, do you have any difficulty performing the following activities: 09/20/2019  Hearing? Y  Comment Wears a hearing aid in the right ear. Is deaf in the left ear.  Vision? N   Difficulty concentrating or making decisions? N  Walking or climbing stairs? N  Dressing or bathing? N  Doing errands, shopping? N  Preparing Food and eating ? N  Using the Toilet? N  In the past six months, have you accidently leaked urine? N  Do you have problems with loss of bowel control? N  Managing your Medications? N  Managing your Finances? N  Housekeeping or managing your Housekeeping? N  Some recent data might be hidden    Patient Care Team: Jerrol Banana., MD as PCP - General (Unknown Physician Specialty) Stanford Breed Denice Bors, MD (Cardiology) Lorelee Cover., MD as Consulting Physician (Ophthalmology) Beverly Gust, MD (Otolaryngology) Dasher, Rayvon Char, MD (Dermatology)   Assessment:   This is  a routine wellness examination for [name].  Exercise Activities and Dietary recommendations Current Exercise Habits: The patient does not participate in regular exercise at present, Exercise limited by: orthopedic condition(s)  Goals    . DIET - INCREASE WATER INTAKE     Recommend increasing water intake to 6 glasses a day.     . Increase water intake     Starting 08/06/16, I will increase to 4 glasses of water a day.       Fall Risk: Fall Risk  09/20/2019 09/15/2018 09/01/2017 08/06/2016 06/11/2015  Falls in the past year? 0 0 Yes No No  Number falls in past yr: 0 - 1 - -  Comment - - slipped in rain - -  Injury with Fall? 1 - No - -  Risk for fall due to : Other (Comment) - - - -  Risk for fall due to: Comment vertigo - - - -  Follow up Falls prevention discussed - Falls prevention discussed - -    FALL RISK PREVENTION PERTAINING TO THE HOME:  Any stairs in or around the home? No  If so, are there any without handrails? N/A  Home free of loose throw rugs in walkways, pet beds, electrical cords, etc? Yes  Adequate lighting in your home to reduce risk of falls? Yes   ASSISTIVE DEVICES UTILIZED TO PREVENT FALLS:  Life alert? No  Use of a cane, walker or  w/c? Yes  Grab bars in the bathroom? Yes  Shower chair or bench in shower? Yes  Elevated toilet seat or a handicapped toilet? Yes   TIMED UP AND GO:  Was the test performed? No .    Depression Screen PHQ 2/9 Scores 09/20/2019 09/15/2018 09/15/2018 09/01/2017  PHQ - 2 Score 0 1 1 2   PHQ- 9 Score - 4 - 4    Cognitive Function     6CIT Screen 09/20/2019 09/15/2018 09/01/2017 08/06/2016  What Year? 0 points 0 points 0 points 0 points  What month? 0 points 0 points 0 points 0 points  What time? 0 points 0 points 0 points 0 points  Count back from 20 0 points 0 points 0 points 0 points  Months in reverse 0 points 0 points 0 points 0 points  Repeat phrase 0 points 0 points 2 points 0 points  Total Score 0 0 2 0    Immunization History  Administered Date(s) Administered  . Influenza, High Dose Seasonal PF 08/09/2015, 07/14/2016, 07/23/2017, 09/08/2018  . Influenza,inj,Quad PF,6+ Mos 07/15/2019  . Influenza-Unspecified 07/13/2013  . Pneumococcal Conjugate-13 04/11/2014  . Pneumococcal Polysaccharide-23 08/19/2006  . Td 05/01/2005  . Tdap 06/12/2015  . Zoster 02/05/2011  . Zoster Recombinat (Shingrix) 06/04/2018, 08/10/2018    Qualifies for Shingles Vaccine? Completed series  Tdap: Up to date  Flu Vaccine: Up to date  Pneumococcal Vaccine: Completed series  Screening Tests Health Maintenance  Topic Date Due  . TETANUS/TDAP  06/11/2025  . INFLUENZA VACCINE  Completed  . PNA vac Low Risk Adult  Completed   Cancer Screenings:  Colorectal Screening: No longer required.   Lung Cancer Screening: (Low Dose CT Chest recommended if Age 42-80 years, 30 pack-year currently smoking OR have quit w/in 15years.) does not qualify.   Additional Screening:  Vision Screening: Recommended annual ophthalmology exams for early detection of glaucoma and other disorders of the eye.  Dental Screening: Recommended annual dental exams for proper oral hygiene  Community Resource Referral:   CRR required this visit?  No  Plan:  I have personally reviewed and addressed the Medicare Annual Wellness questionnaire and have noted the following in the patient's chart:  A. Medical and social history B. Use of alcohol, tobacco or illicit drugs  C. Current medications and supplements D. Functional ability and status E.  Nutritional status F.  Physical activity G. Advance directives H. List of other physicians I.  Hospitalizations, surgeries, and ER visits in previous 12 months J.  [place] such as hearing and vision if needed, cognitive and depression L. Referrals and appointments   In addition, I have reviewed and discussed with patient certain preventive protocols, quality metrics, and best practice recommendations. A written personalized care plan for preventive services as well as general preventive health recommendations were provided to patient.   Glendora Score, LPN  [id number] Nurse Health Advisor   Nurse Notes: None.

## 2019-09-20 ENCOUNTER — Other Ambulatory Visit: Payer: Self-pay

## 2019-09-20 ENCOUNTER — Ambulatory Visit (INDEPENDENT_AMBULATORY_CARE_PROVIDER_SITE_OTHER): Payer: Medicare HMO

## 2019-09-20 DIAGNOSIS — Z0000 Encounter for general adult medical examination without abnormal findings: Secondary | ICD-10-CM

## 2019-09-20 NOTE — Patient Instructions (Signed)
[name] , Thank you for taking time to come for your Medicare Wellness Visit. I appreciate your ongoing commitment to your health goals. Please review the following plan we discussed and let me know if I can assist you in the future.   Screening recommendations/referrals: Colonoscopy: No longer required.  Recommended yearly ophthalmology/optometry visit for glaucoma screening and checkup Recommended yearly dental visit for hygiene and checkup  Vaccinations: Influenza vaccine: Up to date Pneumococcal vaccine: Completed series Tdap vaccine: Up to date, due 05/2025 Shingles vaccine: Completed series    Advanced directives: Please bring a copy of your POA (Power of Attorney) and/or Living Will to your next appointment.   Conditions/risks identified: Recommend to increase water intake to 6-8 8 oz glasses a day.   Next appointment: 09/28/19 @ 1:40 PM with Dr Rosanna Randy  Preventive Care 83 Years and Older, Male Preventive care refers to lifestyle choices and visits with your health care provider that can promote health and wellness. What does preventive care include?  A yearly physical exam. This is also called an annual well check.  Dental exams once or twice a year.  Routine eye exams. Ask your health care provider how often you should have your eyes checked.  Personal lifestyle choices, including:  Daily care of your teeth and gums.  Regular physical activity.  Eating a healthy diet.  Avoiding tobacco and drug use.  Limiting alcohol use.  Practicing safe sex.  Taking low doses of aspirin every day.  Taking vitamin and mineral supplements as recommended by your health care provider. What happens during an annual well check? The services and screenings done by your health care provider during your annual well check will depend on your age, overall health, lifestyle risk factors, and family history of disease. Counseling  Your health care provider may ask you questions about  your:  Alcohol use.  Tobacco use.  Drug use.  Emotional well-being.  Home and relationship well-being.  Sexual activity.  Eating habits.  History of falls.  Memory and ability to understand (cognition).  Work and work Statistician. Screening  You may have the following tests or measurements:  Height, weight, and BMI.  Blood pressure.  Lipid and cholesterol levels. These may be checked every 5 years, or more frequently if you are over 68 years old.  Skin check.  Lung cancer screening. You may have this screening every year starting at age 5 if you have a 30-pack-year history of smoking and currently smoke or have quit within the past 15 years.  Fecal occult blood test (FOBT) of the stool. You may have this test every year starting at age 8.  Flexible sigmoidoscopy or colonoscopy. You may have a sigmoidoscopy every 5 years or a colonoscopy every 10 years starting at age 47.  Prostate cancer screening. Recommendations will vary depending on your family history and other risks.  Hepatitis C blood test.  Hepatitis B blood test.  Sexually transmitted disease (STD) testing.  Diabetes screening. This is done by checking your blood sugar (glucose) after you have not eaten for a while (fasting). You may have this done every 1-3 years.  Abdominal aortic aneurysm (AAA) screening. You may need this if you are a current or former smoker.  Osteoporosis. You may be screened starting at age 83 if you are at high risk. Talk with your health care provider about your test results, treatment options, and if necessary, the need for more tests. Vaccines  Your health care provider may recommend certain vaccines,  such as:  Influenza vaccine. This is recommended every year.  Tetanus, diphtheria, and acellular pertussis (Tdap, Td) vaccine. You may need a Td booster every 10 years.  Zoster vaccine. You may need this after age 63.  Pneumococcal 13-valent conjugate (PCV13) vaccine.  One dose is recommended after age 25.  Pneumococcal polysaccharide (PPSV23) vaccine. One dose is recommended after age 49. Talk to your health care provider about which screenings and vaccines you need and how often you need them. This information is not intended to replace advice given to you by your health care provider. Make sure you discuss any questions you have with your health care provider. Document Released: 10/26/2015 Document Revised: 06/18/2016 Document Reviewed: 07/31/2015 Elsevier Interactive Patient Education  2017 [place] Prevention in the Home Falls can cause injuries. They can happen to people of all ages. There are many things you can do to make your home safe and to help prevent falls. What can I do on the outside of my home?  Regularly fix the edges of walkways and driveways and fix any cracks.  Remove anything that might make you trip as you walk through a door, such as a raised step or threshold.  Trim any bushes or trees on the path to your home.  Use bright outdoor lighting.  Clear any walking paths of anything that might make someone trip, such as rocks or tools.  Regularly check to see if handrails are loose or broken. Make sure that both sides of any steps have handrails.  Any raised decks and porches should have guardrails on the edges.  Have any leaves, snow, or ice cleared regularly.  Use sand or salt on walking paths during winter.  Clean up any spills in your garage right away. This includes oil or grease spills. What can I do in the bathroom?  Use night lights.  Install grab bars by the toilet and in the tub and shower. Do not use towel bars as grab bars.  Use non-skid mats or decals in the tub or shower.  If you need to sit down in the shower, use a plastic, non-slip stool.  Keep the floor dry. Clean up any water that spills on the floor as soon as it happens.  Remove soap buildup in the tub or shower regularly.  Attach bath  mats securely with double-sided non-slip rug tape.  Do not have throw rugs and other things on the floor that can make you trip. What can I do in the bedroom?  Use night lights.  Make sure that you have a light by your bed that is easy to reach.  Do not use any sheets or blankets that are too big for your bed. They should not hang down onto the floor.  Have a firm chair that has side arms. You can use this for support while you get dressed.  Do not have throw rugs and other things on the floor that can make you trip. What can I do in the kitchen?  Clean up any spills right away.  Avoid walking on wet floors.  Keep items that you use a lot in easy-to-reach places.  If you need to reach something above you, use a strong step stool that has a grab bar.  Keep electrical cords out of the way.  Do not use floor polish or wax that makes floors slippery. If you must use wax, use non-skid floor wax.  Do not have throw rugs and other things on  the floor that can make you trip. What can I do with my stairs?  Do not leave any items on the stairs.  Make sure that there are handrails on both sides of the stairs and use them. Fix handrails that are broken or loose. Make sure that handrails are as long as the stairways.  Check any carpeting to make sure that it is firmly attached to the stairs. Fix any carpet that is loose or worn.  Avoid having throw rugs at the top or bottom of the stairs. If you do have throw rugs, attach them to the floor with carpet tape.  Make sure that you have a light switch at the top of the stairs and the bottom of the stairs. If you do not have them, ask someone to add them for you. What else can I do to help prevent falls?  Wear shoes that:  Do not have high heels.  Have rubber bottoms.  Are comfortable and fit you well.  Are closed at the toe. Do not wear sandals.  If you use a stepladder:  Make sure that it is fully opened. Do not climb a closed  stepladder.  Make sure that both sides of the stepladder are locked into place.  Ask someone to hold it for you, if possible.  Clearly mark and make sure that you can see:  Any grab bars or handrails.  First and last steps.  Where the edge of each step is.  Use tools that help you move around (mobility aids) if they are needed. These include:  Canes.  Walkers.  Scooters.  Crutches.  Turn on the lights when you go into a dark area. Replace any light bulbs as soon as they burn out.  Set up your furniture so you have a clear path. Avoid moving your furniture around.  If any of your floors are uneven, fix them.  If there are any pets around you, be aware of where they are.  Review your medicines with your doctor. Some medicines can make you feel dizzy. This can increase your chance of falling. Ask your doctor what other things that you can do to help prevent falls. This information is not intended to replace advice given to you by your health care provider. Make sure you discuss any questions you have with your health care provider. Document Released: 07/26/2009 Document Revised: 03/06/2016 Document Reviewed: 11/03/2014 Elsevier Interactive Patient Education  2017 [employer].

## 2019-09-22 DIAGNOSIS — H524 Presbyopia: Secondary | ICD-10-CM | POA: Diagnosis not present

## 2019-09-22 DIAGNOSIS — H40153 Residual stage of open-angle glaucoma, bilateral: Secondary | ICD-10-CM | POA: Diagnosis not present

## 2019-09-23 ENCOUNTER — Other Ambulatory Visit: Payer: Self-pay | Admitting: Family Medicine

## 2019-09-23 DIAGNOSIS — G4700 Insomnia, unspecified: Secondary | ICD-10-CM

## 2019-09-23 NOTE — Telephone Encounter (Signed)
Pt stated that [facility] is needing a a refill sent over for LORazepam (ATIVAN) 0.5 MG tablet [id number] .  He state that they have faxed over to the office a few times and he wanted to check the [place], [place] Phone:  [phone]  Fax:  [phone]

## 2019-09-26 NOTE — Telephone Encounter (Signed)
Patient is calling back to check on the status of his medication refill. Patient states that he is down to 4 pills And the refill is to go to his mail order. Patient does not want to run out of medication. Please advise.  [facility]

## 2019-09-26 NOTE — Telephone Encounter (Signed)
Ok to refill 

## 2019-09-27 MED ORDER — LORAZEPAM 0.5 MG PO TABS: 0.5000 mg | ORAL_TABLET | Freq: Every evening | ORAL | 5 refills | Status: DC | PRN

## 2019-09-28 ENCOUNTER — Encounter: Payer: Self-pay | Admitting: Family Medicine

## 2019-09-28 ENCOUNTER — Ambulatory Visit (INDEPENDENT_AMBULATORY_CARE_PROVIDER_SITE_OTHER): Payer: Medicare HMO | Admitting: Family Medicine

## 2019-09-28 ENCOUNTER — Other Ambulatory Visit: Payer: Self-pay

## 2019-09-28 VITALS — BP 124/75 | HR 92 | Temp 97.7°F | Resp 18 | Ht 68.0 in | Wt 215.8 lb

## 2019-09-28 DIAGNOSIS — K219 Gastro-esophageal reflux disease without esophagitis: Secondary | ICD-10-CM

## 2019-09-28 DIAGNOSIS — Z1329 Encounter for screening for other suspected endocrine disorder: Secondary | ICD-10-CM | POA: Diagnosis not present

## 2019-09-28 DIAGNOSIS — N401 Enlarged prostate with lower urinary tract symptoms: Secondary | ICD-10-CM

## 2019-09-28 DIAGNOSIS — M8949 Other hypertrophic osteoarthropathy, multiple sites: Secondary | ICD-10-CM | POA: Diagnosis not present

## 2019-09-28 DIAGNOSIS — I2510 Atherosclerotic heart disease of native coronary artery without angina pectoris: Secondary | ICD-10-CM | POA: Diagnosis not present

## 2019-09-28 DIAGNOSIS — Z0000 Encounter for general adult medical examination without abnormal findings: Secondary | ICD-10-CM

## 2019-09-28 DIAGNOSIS — E785 Hyperlipidemia, unspecified: Secondary | ICD-10-CM

## 2019-09-28 DIAGNOSIS — F411 Generalized anxiety disorder: Secondary | ICD-10-CM | POA: Diagnosis not present

## 2019-09-28 DIAGNOSIS — R109 Unspecified abdominal pain: Secondary | ICD-10-CM

## 2019-09-28 DIAGNOSIS — R10A0 Flank pain, unspecified side: Secondary | ICD-10-CM

## 2019-09-28 DIAGNOSIS — M150 Primary generalized (osteo)arthritis: Secondary | ICD-10-CM

## 2019-09-28 DIAGNOSIS — I10 Essential (primary) hypertension: Secondary | ICD-10-CM

## 2019-09-28 DIAGNOSIS — R351 Nocturia: Secondary | ICD-10-CM

## 2019-09-28 DIAGNOSIS — M159 Polyosteoarthritis, unspecified: Secondary | ICD-10-CM

## 2019-09-28 LAB — POCT URINALYSIS DIPSTICK
Bilirubin, UA: NEGATIVE
Glucose, UA: NEGATIVE
Ketones, UA: NEGATIVE
Leukocytes, UA: NEGATIVE
Nitrite, UA: NEGATIVE
Protein, UA: NEGATIVE
Spec Grav, UA: 1.025 (ref 1.010–1.025)
Urobilinogen, UA: 0.2 E.U./dL
pH, UA: 6 (ref 5.0–8.0)

## 2019-09-28 NOTE — Progress Notes (Signed)
Patient: [name], Male    DOB: [birth year], 85 y.o.   MRN: [id number] Visit Date: 09/28/2019  Today's Provider: [name], MD   Chief Complaint  Patient presents with  . Annual Exam   Subjective:     Complete Physical [name] is a 85 y.o. male. He feels fairly well. He reports exercising not like he should. He reports he is sleeping poorly. He complains of chronic low back pain, some mild flank pain and nocturia x2-3 which she has had for several years. ----------------------------------------------------------- Essential hypertension Controlled on amlodipine and labetalol  Hyperlipidemia, unspecified hyperlipidemia type On rosuvastatin.  Gastroesophageal reflux disease, esophagitis presence not specified Controlled, try every other day omeprazole.  He is now off of Zantac. More than 50% of 25-minute visit spent in counseling and coordination of care  Atherosclerosis of coronary artery bypass graft of native heart without angina pectoris All risk factors treated.  Post CABG 1997   Review of Systems  Constitutional: Positive for fatigue.  HENT: Positive for congestion, facial swelling, hearing loss and sneezing.   Eyes: Positive for itching.  Respiratory: Negative.   Cardiovascular: Negative.   Gastrointestinal: Negative.   Endocrine: Positive for cold intolerance and polyuria.  Genitourinary: Negative.        Nocturia.  Musculoskeletal: Positive for arthralgias, back pain, neck pain and neck stiffness.  Skin: Negative.   Neurological: Positive for dizziness.  Hematological: Negative.   Psychiatric/Behavioral: Negative.     Social History   Socioeconomic History  . Marital status: Divorced    Spouse name: Not on file  . Number of children: 3  . Years of education: Not on file  . Highest education level: Some college, no degree  Occupational History  . Occupation: retired  Tobacco Use  . Smoking status: Never Smoker  . Smokeless  tobacco: Never Used  Substance and Sexual Activity  . Alcohol use: Yes    Comment: drinks a beer occasionally  . Drug use: No  . Sexual activity: Not on file  Other Topics Concern  . Not on file  Social History Narrative  . Not on file   Social Determinants of Health   Financial Resource Strain:   . Difficulty of Paying Living Expenses: Not on file  Food Insecurity:   . Worried About Charity fundraiser in the Last Year: Not on file  . Ran Out of Food in the Last Year: Not on file  Transportation Needs:   . Lack of Transportation (Medical): Not on file  . Lack of Transportation (Non-Medical): Not on file  Physical Activity:   . Days of Exercise per Week: Not on file  . Minutes of Exercise per Session: Not on file  Stress:   . Feeling of Stress : Not on file  Social Connections:   . Frequency of Communication with Friends and Family: Not on file  . Frequency of Social Gatherings with Friends and Family: Not on file  . Attends Religious Services: Not on file  . Active Member of Clubs or Organizations: Not on file  . Attends Archivist Meetings: Not on file  . Marital Status: Not on file  Intimate Partner Violence:   . Fear of Current or Ex-Partner: Not on file  . Emotionally Abused: Not on file  . Physically Abused: Not on file  . Sexually Abused: Not on file    Past Medical History:  Diagnosis Date  . Anemia, unspecified   .  Anxiety state, unspecified   . Cancer ([place])    skin  . Coronary atherosclerosis   . GERD (gastroesophageal reflux disease)   . Osteoarthritis    group B streptococcal septic arthritis  . Unspecified essential hypertension      Patient Active Problem List   Diagnosis Date Noted  . Arthropathy of hand 06/11/2015  . Arteriosclerosis of coronary artery 06/11/2015  . Impotence of organic origin 06/11/2015  . Generalized anxiety disorder 06/11/2015  . Acid reflux 06/11/2015  . History of other malignant neoplasm of skin 06/11/2015   . HLD (hyperlipidemia) 06/11/2015  . Arthritis, degenerative 06/11/2015  . Adiposity 06/11/2015  . Venous insufficiency of leg 06/11/2015  . HYPERLIPIDEMIA 08/16/2009  . ANEMIA 08/16/2009  . ANXIETY 08/16/2009  . GERD 08/16/2009  . OSTEOARTHRITIS 08/16/2009  . Umbilical hernia without obstruction and without gangrene 03/28/2008  . Essential hypertension 10/20/2006  . Coronary atherosclerosis 10/20/2006  . CARDIAC ARRHYTHMIA 10/20/2006  . SEPTIC ARTHRITIS 10/20/2006    No past surgical history on file.  His family history includes Arthritis in his brother; Cancer in his father; Dementia in his mother; Depression in his sister; Fibromyalgia in his sister; Heart disease in his mother; Parkinson's disease in his mother; Sleep apnea in his brother.   Current Outpatient Medications:  .  amLODipine (NORVASC) 10 MG tablet, TAKE 1 TABLET EVERY EVENING, Disp: 90 tablet, Rfl: 3 .  aspirin 81 MG tablet, Take 81 mg by mouth daily.  , Disp: , Rfl:  .  Cholecalciferol (VITAMIN D3) 2000 units TABS, Take 1 tablet by mouth daily., Disp: , Rfl:  .  co-enzyme Q-10 30 MG capsule, Take 30 mg by mouth daily., Disp: , Rfl:  .  dorzolamide (TRUSOPT) 2 % ophthalmic solution, Place 1 drop 2 (two) times daily into both eyes. , Disp: , Rfl:  .  doxazosin (CARDURA) 2 MG tablet, TAKE 1 TABLET EVERY DAY, Disp: 90 tablet, Rfl: 3 .  fluticasone (FLONASE) 50 MCG/ACT nasal spray, Place 2 sprays into both nostrils daily., Disp: 16 g, Rfl: 6 .  ibuprofen (ADVIL,MOTRIN) 200 MG tablet, Take 200 mg by mouth daily as needed for moderate pain., Disp: , Rfl:  .  labetalol (NORMODYNE) 200 MG tablet, TAKE 1 TABLET TWICE DAILY, Disp: 180 tablet, Rfl: 3 .  latanoprost (XALATAN) 0.005 % ophthalmic solution, 1 drop at bedtime.  , Disp: , Rfl:  .  loratadine (CLARITIN) 10 MG tablet, Take 1 tablet (10 mg total) by mouth daily., Disp: 30 tablet, Rfl: 11 .  LORazepam (ATIVAN) 0.5 MG tablet, Take 1 tablet (0.5 mg total) by mouth at  bedtime as needed. for sleep, Disp: 30 tablet, Rfl: 5 .  losartan-hydrochlorothiazide (HYZAAR) 100-25 MG tablet, TAKE 1 TABLET EVERY DAY, Disp: 90 tablet, Rfl: 3 .  meclizine (ANTIVERT) 25 MG tablet, TAKE 1 TABLET DAILY AS NEEDED FOR DIZZINESS., Disp: 90 tablet, Rfl: 3 .  Omega-3 Fatty Acids (FISH OIL) 1000 MG CAPS, Take by mouth daily., Disp: , Rfl:  .  pantoprazole (PROTONIX) 20 MG tablet, Take 20 mg by mouth daily. , Disp: , Rfl:  .  potassium chloride SA (KLOR-CON) 20 MEQ tablet, TAKE 1 TABLET EVERY DAY, Disp: 90 tablet, Rfl: 3 .  rosuvastatin (CRESTOR) 40 MG tablet, TAKE 1 TABLET EVERY DAY, Disp: 90 tablet, Rfl: 1  Patient Care Team: Jerrol Banana., MD as PCP - General (Unknown Physician Specialty) Stanford Breed Denice Bors, MD (Cardiology) Lorelee Cover., MD as Consulting Physician (Ophthalmology) Beverly Gust, MD (Otolaryngology) Dasher,  Rayvon Char, MD (Dermatology)     Objective:    Vitals: BP 124/75   Pulse 92   Temp 97.7 F (36.5 C) (Temporal)   Resp 18   Ht 5' 8"  (1.727 m)   Wt 215 lb 12.8 oz (97.9 kg)   SpO2 95%   BMI 32.81 kg/m   Physical Exam  Activities of Daily Living In your present state of health, do you have any difficulty performing the following activities: 09/28/2019 09/20/2019  Hearing? Y Y  Comment - Wears a hearing aid in the right ear. Is deaf in the left ear.  Vision? N N  Difficulty concentrating or making decisions? N N  Walking or climbing stairs? N N  Dressing or bathing? N N  Doing errands, shopping? N N  Preparing Food and eating ? - N  Using the Toilet? - N  In the past six months, have you accidently leaked urine? - N  Do you have problems with loss of bowel control? - N  Managing your Medications? - N  Managing your Finances? - N  Housekeeping or managing your Housekeeping? - N  Some recent data might be hidden    Fall Risk Assessment Fall Risk  09/28/2019 09/20/2019 09/15/2018 09/01/2017 08/06/2016  Falls in the past year? 0 0  0 Yes No  Number falls in past yr: 0 0 - 1 -  Comment - - - slipped in rain -  Injury with Fall? 0 1 - No -  Risk for fall due to : - Other (Comment) - - -  Risk for fall due to: Comment - vertigo - - -  Follow up Falls evaluation completed Falls prevention discussed - Falls prevention discussed -     Depression Screen PHQ 2/9 Scores 09/28/2019 09/20/2019 09/15/2018 09/15/2018  PHQ - 2 Score 1 0 1 1  PHQ- 9 Score 5 - 4 -    6CIT Screen 09/20/2019  What Year? 0 points  What month? 0 points  What time? 0 points  Count back from 20 0 points  Months in reverse 0 points  Repeat phrase 0 points  Total Score 0       Assessment & Plan:    Annual Physical Reviewed patient's Family Medical History Reviewed and updated list of patient's medical providers Assessment of cognitive impairment was done Assessed patient's functional ability Established a written schedule for health screening [place] Completed and Reviewed  Exercise Activities and Dietary recommendations Goals    . DIET - INCREASE WATER INTAKE     Recommend increasing water intake to 6 glasses a day.     . Increase water intake     Starting 08/06/16, I will increase to 4 glasses of water a day.       Immunization History  Administered Date(s) Administered  . Influenza, High Dose Seasonal PF 08/09/2015, 07/14/2016, 07/23/2017, 09/08/2018  . Influenza,inj,Quad PF,6+ Mos 07/15/2019  . Influenza-Unspecified 07/13/2013  . Pneumococcal Conjugate-13 04/11/2014  . Pneumococcal Polysaccharide-23 08/19/2006  . Td 05/01/2005  . Tdap 06/12/2015  . Zoster 02/05/2011  . Zoster Recombinat (Shingrix) 06/04/2018, 08/10/2018    Health Maintenance  Topic Date Due  . TETANUS/TDAP  06/11/2025  . INFLUENZA VACCINE  Completed  . PNA vac Low Risk Adult  Completed     Discussed health benefits of physical activity, and encouraged him to engage in regular exercise appropriate for his age and condition.     ------------------------------------------------------------------------------------------------------------  1. Annual physical exam   2. Essential  hypertension  - Comp Met (CMET) - CBC with Diff  3. Hyperlipidemia, unspecified hyperlipidemia type  - Comp Met (CMET) - CBC with Diff - Lipid panel  4. Gastroesophageal reflux disease, unspecified whether esophagitis present  - Comp Met (CMET) - CBC with Diff  5. Thyroid disorder screening  - TSH  6. Flank pain  - POCT Urinalysis Dipstick - Urine Culture  7. Generalized anxiety disorder   8. Arteriosclerosis of coronary artery All risk factors treated 9.BPH Try stopping fluid intake after dinner at night.  Consider increasing Cardura dose 10.OA   Richard Cranford Mon, MD  [facility]

## 2019-09-29 DIAGNOSIS — Z1329 Encounter for screening for other suspected endocrine disorder: Secondary | ICD-10-CM | POA: Diagnosis not present

## 2019-09-29 DIAGNOSIS — E785 Hyperlipidemia, unspecified: Secondary | ICD-10-CM | POA: Diagnosis not present

## 2019-09-29 DIAGNOSIS — K219 Gastro-esophageal reflux disease without esophagitis: Secondary | ICD-10-CM | POA: Diagnosis not present

## 2019-09-29 DIAGNOSIS — I10 Essential (primary) hypertension: Secondary | ICD-10-CM | POA: Diagnosis not present

## 2019-09-30 LAB — CBC WITH DIFFERENTIAL/PLATELET
Basophils Absolute: 0 10*3/uL (ref 0.0–0.2)
Basos: 1 %
EOS (ABSOLUTE): 0.3 10*3/uL (ref 0.0–0.4)
Eos: 6 %
Hematocrit: 39.6 % (ref 37.5–51.0)
Hemoglobin: 13.4 g/dL (ref 13.0–17.7)
Immature Grans (Abs): 0 10*3/uL (ref 0.0–0.1)
Immature Granulocytes: 1 %
Lymphocytes Absolute: 0.9 10*3/uL (ref 0.7–3.1)
Lymphs: 17 %
MCH: 30.3 pg (ref 26.6–33.0)
MCHC: 33.8 g/dL (ref 31.5–35.7)
MCV: 90 fL (ref 79–97)
Monocytes Absolute: 0.5 10*3/uL (ref 0.1–0.9)
Monocytes: 10 %
Neutrophils Absolute: 3.5 10*3/uL (ref 1.4–7.0)
Neutrophils: 65 %
Platelets: 218 10*3/uL (ref 150–450)
RBC: 4.42 x10E6/uL (ref 4.14–5.80)
RDW: 13.8 % (ref 11.6–15.4)
WBC: 5.3 10*3/uL (ref 3.4–10.8)

## 2019-09-30 LAB — LIPID PANEL
Chol/HDL Ratio: 2.4 ratio (ref 0.0–5.0)
Cholesterol, Total: 86 mg/dL — ABNORMAL LOW (ref 100–199)
HDL: 36 mg/dL — ABNORMAL LOW (ref >39)
LDL Chol Calc (NIH): 32 mg/dL (ref 0–99)
Triglycerides: 94 mg/dL (ref 0–149)
VLDL Cholesterol Cal: 18 mg/dL (ref 5–40)

## 2019-09-30 LAB — COMPREHENSIVE METABOLIC PANEL
ALT: 18 IU/L (ref 0–44)
AST: 15 IU/L (ref 0–40)
Albumin/Globulin Ratio: 2 (ref 1.2–2.2)
Albumin: 4.3 g/dL (ref 3.6–4.6)
Alkaline Phosphatase: 87 IU/L (ref 39–117)
BUN/Creatinine Ratio: 13 (ref 10–24)
BUN: 18 mg/dL (ref 8–27)
Bilirubin Total: 0.5 mg/dL (ref 0.0–1.2)
CO2: 21 mmol/L (ref 20–29)
Calcium: 9.2 mg/dL (ref 8.6–10.2)
Chloride: 104 mmol/L (ref 96–106)
Creatinine, Ser: 1.37 mg/dL — ABNORMAL HIGH (ref 0.76–1.27)
GFR calc Af Amer: 54 mL/min/{1.73_m2} — ABNORMAL LOW (ref >59)
GFR calc non Af Amer: 47 mL/min/{1.73_m2} — ABNORMAL LOW (ref >59)
Globulin, Total: 2.2 g/dL (ref 1.5–4.5)
Glucose: 122 mg/dL — ABNORMAL HIGH (ref 65–99)
Potassium: 3.8 mmol/L (ref 3.5–5.2)
Sodium: 141 mmol/L (ref 134–144)
Total Protein: 6.5 g/dL (ref 6.0–8.5)

## 2019-09-30 LAB — TSH: TSH: 1.84 u[IU]/mL (ref 0.450–4.500)

## 2019-10-01 LAB — URINE CULTURE: Organism ID, Bacteria: NO GROWTH

## 2019-10-03 ENCOUNTER — Telehealth: Payer: Self-pay | Admitting: *Deleted

## 2019-10-03 ENCOUNTER — Telehealth: Payer: Self-pay

## 2019-10-03 NOTE — Telephone Encounter (Signed)
-----   Message from Jerrol Banana., MD sent at 10/02/2019  8:49 AM EST ----- Labs stable

## 2019-10-03 NOTE — Telephone Encounter (Signed)
Called and lvm about lab results.

## 2019-10-03 NOTE — Telephone Encounter (Signed)
LMOVM for pt to return call 

## 2019-10-17 NOTE — Progress Notes (Signed)
HPI: FU coronary artery disease status post coronary bypassing graft in May 1997. He also has a history of atrial flutter ablation. Previous abdominal ultrasound in May 2006 showed no significant renal artery stenosis and normal caliber aorta. Nuclear study 5/16 showed inferior ischemia and EF 61; we are treating medically. Since I last saw him, patient denies dyspnea, chest pain or syncope.  He occasionally feels lightheaded with standing and attributes this to vertigo.  Current Outpatient Medications  Medication Sig Dispense Refill  . amLODipine (NORVASC) 10 MG tablet TAKE 1 TABLET EVERY EVENING 90 tablet 3  . aspirin 81 MG tablet Take 81 mg by mouth daily.      . Cholecalciferol (VITAMIN D3) 2000 units TABS Take 1 tablet by mouth daily.    Marland Kitchen co-enzyme Q-10 30 MG capsule Take 30 mg by mouth daily.    . dorzolamide (TRUSOPT) 2 % ophthalmic solution Place 1 drop 2 (two) times daily into both eyes.     Marland Kitchen doxazosin (CARDURA) 2 MG tablet TAKE 1 TABLET EVERY DAY 90 tablet 3  . fluticasone (FLONASE) 50 MCG/ACT nasal spray Place 2 sprays into both nostrils daily. 16 g 6  . ibuprofen (ADVIL,MOTRIN) 200 MG tablet Take 200 mg by mouth daily as needed for moderate pain.    Marland Kitchen labetalol (NORMODYNE) 200 MG tablet TAKE 1 TABLET TWICE DAILY 180 tablet 3  . latanoprost (XALATAN) 0.005 % ophthalmic solution 1 drop at bedtime.      Marland Kitchen loratadine (CLARITIN) 10 MG tablet Take 1 tablet (10 mg total) by mouth daily. 30 tablet 11  . LORazepam (ATIVAN) 0.5 MG tablet Take 1 tablet (0.5 mg total) by mouth at bedtime as needed. for sleep 30 tablet 5  . losartan-hydrochlorothiazide (HYZAAR) 100-25 MG tablet TAKE 1 TABLET EVERY DAY 90 tablet 3  . meclizine (ANTIVERT) 25 MG tablet TAKE 1 TABLET DAILY AS NEEDED FOR DIZZINESS. 90 tablet 3  . Omega-3 Fatty Acids (FISH OIL) 1000 MG CAPS Take by mouth daily.    . pantoprazole (PROTONIX) 20 MG tablet Take 20 mg by mouth daily.     . potassium chloride SA (KLOR-CON) 20 MEQ  tablet TAKE 1 TABLET EVERY DAY 90 tablet 3  . rosuvastatin (CRESTOR) 40 MG tablet TAKE 1 TABLET EVERY DAY 90 tablet 1   No current facility-administered medications for this visit.     Past Medical History:  Diagnosis Date  . Anemia, unspecified   . Anxiety state, unspecified   . Cancer ([place])    skin  . Coronary atherosclerosis   . GERD (gastroesophageal reflux disease)   . Osteoarthritis    group B streptococcal septic arthritis  . Unspecified essential hypertension     History reviewed. No pertinent surgical history.  Social History   Socioeconomic History  . Marital status: Divorced    Spouse name: Not on file  . Number of children: 3  . Years of education: Not on file  . Highest education level: Some college, no degree  Occupational History  . Occupation: retired  Tobacco Use  . Smoking status: Never Smoker  . Smokeless tobacco: Never Used  Substance and Sexual Activity  . Alcohol use: Yes    Comment: drinks a beer occasionally  . Drug use: No  . Sexual activity: Not on file  Other Topics Concern  . Not on file  Social History Narrative  . Not on file   Social Determinants of Health   Financial Resource Strain:   . Difficulty  of Paying Living Expenses: Not on file  Food Insecurity:   . Worried About Charity fundraiser in the Last Year: Not on file  . Ran Out of Food in the Last Year: Not on file  Transportation Needs:   . Lack of Transportation (Medical): Not on file  . Lack of Transportation (Non-Medical): Not on file  Physical Activity:   . Days of Exercise per Week: Not on file  . Minutes of Exercise per Session: Not on file  Stress:   . Feeling of Stress : Not on file  Social Connections:   . Frequency of Communication with Friends and Family: Not on file  . Frequency of Social Gatherings with Friends and Family: Not on file  . Attends Religious Services: Not on file  . Active Member of Clubs or Organizations: Not on file  . Attends Theatre manager Meetings: Not on file  . Marital Status: Not on file  Intimate Partner Violence:   . Fear of Current or Ex-Partner: Not on file  . Emotionally Abused: Not on file  . Physically Abused: Not on file  . Sexually Abused: Not on file    Family History  Problem Relation Age of Onset  . Parkinson's disease Mother   . Heart disease Mother   . Dementia Mother   . Cancer Father        lung cancer, smoker  . Depression Sister   . Fibromyalgia Sister   . Arthritis Brother        B/L knee replacement  . Sleep apnea Brother     ROS: no fevers or chills, productive cough, hemoptysis, dysphasia, odynophagia, melena, hematochezia, dysuria, hematuria, rash, seizure activity, orthopnea, PND, pedal edema, claudication. Remaining systems are negative.  Physical Exam: Well-developed well-nourished in no acute distress.  Skin is warm and dry.  HEENT is normal.  Neck is supple.  Chest is clear to auscultation with normal expansion.  Cardiovascular exam is regular rate and rhythm.  Abdominal exam nontender or distended. No masses palpated. Extremities show no edema. neuro grossly intact  ECG-sinus rhythm at a rate of 70, first-degree AV block, nonspecific ST changes.  Personally reviewed  A/P  1 coronary artery disease status post coronary artery bypass and graft-patient doing well.  Continue present medications including aspirin and statin.  2 hyperlipidemia-continue statin.  3 hypertension-patient's blood pressure is controlled.  Question mild orthostatic symptoms.  Decrease amlodipine to 5 mg daily to allow blood pressure to run higher.  Adjust regimen based on symptoms and follow-up readings.  Kirk Ruths, MD

## 2019-10-24 ENCOUNTER — Encounter: Payer: Self-pay | Admitting: Cardiology

## 2019-10-24 ENCOUNTER — Other Ambulatory Visit: Payer: Self-pay

## 2019-10-24 ENCOUNTER — Ambulatory Visit: Payer: Medicare HMO | Admitting: Cardiology

## 2019-10-24 VITALS — BP 112/56 | HR 70 | Ht 68.0 in | Wt 213.2 lb

## 2019-10-24 DIAGNOSIS — E7800 Pure hypercholesterolemia, unspecified: Secondary | ICD-10-CM

## 2019-10-24 DIAGNOSIS — I10 Essential (primary) hypertension: Secondary | ICD-10-CM

## 2019-10-24 DIAGNOSIS — I2510 Atherosclerotic heart disease of native coronary artery without angina pectoris: Secondary | ICD-10-CM | POA: Diagnosis not present

## 2019-10-24 MED ORDER — AMLODIPINE BESYLATE 5 MG PO TABS: 5.0000 mg | ORAL_TABLET | Freq: Every evening | ORAL | 3 refills | Status: DC

## 2019-10-24 NOTE — Patient Instructions (Signed)
Medication Instructions:  DECREASE AMLODIPINE TO 5 MG ONCE DAILY= 1/2 OF THE 10 MG TABLET ONCE DAILY  *If you need a refill on your cardiac medications before your next appointment, please call your pharmacy*  Lab Work: If you have labs (blood work) drawn today and your tests are completely normal, you will receive your results only by: Marland Kitchen MyChart Message (if you have MyChart) OR . A paper copy in the mail If you have any lab test that is abnormal or we need to change your treatment, we will call you to review the results.  Follow-Up: At [facility], you and your health needs are our priority.  As part of our continuing mission to provide you with exceptional heart care, we have created designated Provider Care Teams.  These Care Teams include your primary Cardiologist (physician) and Advanced Practice Providers (APPs -  Physician Assistants and Nurse Practitioners) who all work together to provide you with the care you need, when you need it.  Your next appointment:   12 month(s)  The format for your next appointment:   Either In Person or Virtual  Provider:   You may see Kirk Ruths MD or one of the following Advanced Practice Providers on your designated Care Team:    Kerin Ransom, PA-C  [place], [place]  Coletta Memos, [place]

## 2019-10-25 ENCOUNTER — Telehealth: Payer: Self-pay | Admitting: Family Medicine

## 2019-10-25 NOTE — Telephone Encounter (Addendum)
Pt need a refill on lorazepam #30 w/refills sent to [employer] order. Pt will get medication for free. Pt did [facility]. Pt has about 2 wk supply of medications. Pt would like rx sent now so that when its due for refill [name] will ship the medication. Patient would like a callback. Pt is ok with cancelling the remainder refills at [facility]

## 2019-10-25 NOTE — Telephone Encounter (Signed)
Please advise refill? 

## 2019-10-26 ENCOUNTER — Other Ambulatory Visit: Payer: Self-pay | Admitting: Family Medicine

## 2019-10-26 DIAGNOSIS — K219 Gastro-esophageal reflux disease without esophagitis: Secondary | ICD-10-CM

## 2019-10-26 NOTE — Telephone Encounter (Signed)
Requested medication (s) are due for refill today:yes  Requested medication (s) are on the active medication list: yes  Last refill:  08/31/2019  Future visit scheduled: yes  Notes to clinic: medication for filled by a historical provider  Review for refill   Requested Prescriptions  Pending Prescriptions Disp Refills   pantoprazole (PROTONIX) 20 MG tablet [Pharmacy Med Name: PANTOPRAZOLE SODIUM 20 MG Tablet Delayed Release] 90 tablet     Sig: TAKE 1 [place]      Gastroenterology: Proton Pump Inhibitors Passed - 10/26/2019  1:06 PM      Passed - Valid encounter within last 12 months    Recent Outpatient Visits           4 weeks ago Annual physical exam   [facility] Jerrol Banana., MD   7 months ago Essential hypertension   [facility] Jerrol Banana., MD   1 year ago Essential hypertension   [facility] Jerrol Banana., MD   1 year ago Dizzy   [facility] Jerrol Banana., MD   1 year ago Essential hypertension   [facility] Jerrol Banana., MD       Future Appointments             In 5 months Jerrol Banana., MD [facility], [place]

## 2019-11-01 ENCOUNTER — Other Ambulatory Visit: Payer: Self-pay | Admitting: Family Medicine

## 2019-11-01 DIAGNOSIS — G4700 Insomnia, unspecified: Secondary | ICD-10-CM

## 2019-11-01 MED ORDER — LORAZEPAM 0.5 MG PO TABS: 0.5000 mg | ORAL_TABLET | Freq: Every evening | ORAL | 1 refills | Status: DC | PRN

## 2019-11-01 NOTE — Telephone Encounter (Signed)
Dr. Rosanna Randy patient .Marland Kitchen..2nd request.... Patient is asking for refills on Lorazepam 0.5 mg.  to be sent to [facility].   He requested this on 10/25/19 and it was never done.  Please let patient know when this is complete.

## 2019-11-01 NOTE — Telephone Encounter (Signed)
Please advise. Refills were sent in to local [facility] by Dr. Rosanna Randy on 09/27/2019 for qty 53 with 5 refills. Patient states it was supposed to have been sent to his mail order pharmacy. Patient is requesting a prescription to be sent to [facility] mail order pharmacy.

## 2019-12-05 DIAGNOSIS — L538 Other specified erythematous conditions: Secondary | ICD-10-CM | POA: Diagnosis not present

## 2019-12-05 DIAGNOSIS — D2271 Melanocytic nevi of right lower limb, including hip: Secondary | ICD-10-CM | POA: Diagnosis not present

## 2019-12-05 DIAGNOSIS — X32XXXA Exposure to sunlight, initial encounter: Secondary | ICD-10-CM | POA: Diagnosis not present

## 2019-12-05 DIAGNOSIS — D2261 Melanocytic nevi of right upper limb, including shoulder: Secondary | ICD-10-CM | POA: Diagnosis not present

## 2019-12-05 DIAGNOSIS — L820 Inflamed seborrheic keratosis: Secondary | ICD-10-CM | POA: Diagnosis not present

## 2019-12-05 DIAGNOSIS — L570 Actinic keratosis: Secondary | ICD-10-CM | POA: Diagnosis not present

## 2019-12-05 DIAGNOSIS — Z85828 Personal history of other malignant neoplasm of skin: Secondary | ICD-10-CM | POA: Diagnosis not present

## 2019-12-05 DIAGNOSIS — R208 Other disturbances of skin sensation: Secondary | ICD-10-CM | POA: Diagnosis not present

## 2019-12-05 DIAGNOSIS — D2262 Melanocytic nevi of left upper limb, including shoulder: Secondary | ICD-10-CM | POA: Diagnosis not present

## 2019-12-14 ENCOUNTER — Other Ambulatory Visit: Payer: Self-pay | Admitting: Family Medicine

## 2019-12-14 DIAGNOSIS — R42 Dizziness and giddiness: Secondary | ICD-10-CM

## 2020-01-11 ENCOUNTER — Other Ambulatory Visit: Payer: Self-pay | Admitting: Cardiology

## 2020-01-11 DIAGNOSIS — I25810 Atherosclerosis of coronary artery bypass graft(s) without angina pectoris: Secondary | ICD-10-CM

## 2020-01-28 ENCOUNTER — Other Ambulatory Visit: Payer: Self-pay | Admitting: Family Medicine

## 2020-02-02 DIAGNOSIS — H40153 Residual stage of open-angle glaucoma, bilateral: Secondary | ICD-10-CM | POA: Diagnosis not present

## 2020-02-03 ENCOUNTER — Other Ambulatory Visit: Payer: Self-pay | Admitting: Family Medicine

## 2020-02-03 DIAGNOSIS — I10 Essential (primary) hypertension: Secondary | ICD-10-CM

## 2020-02-03 MED ORDER — LOSARTAN POTASSIUM-HCTZ 100-25 MG PO TABS: 1.0000 | ORAL_TABLET | Freq: Every day | ORAL | 3 refills | Status: DC

## 2020-02-03 NOTE — Telephone Encounter (Signed)
Medication refill sent to pharmacy  

## 2020-02-03 NOTE — Telephone Encounter (Signed)
[facility] faxed refill request for the following medications:  losartan-hydrochlorothiazide (HYZAAR) 100-25 MG tablet   Please advise.  Thanks, [employer]

## 2020-03-27 NOTE — Progress Notes (Signed)
Established patient visit   Patient: [name]   DOB: [birth year]   86 y.o. Male  MRN: [id number] Visit Date: 03/29/2020  Today's healthcare provider: [name], MD   Chief Complaint  Patient presents with  . Hypertension  . Hyperlipidemia   Subjective    HPI Patient comes in today for follow-up.  Overall he is doing well.  His girlfriend of 20 years is developing dementia and this has been an issue has had to deal with daily. Hypertension, follow-up  BP Readings from Last 3 Encounters:  03/29/20 126/73  10/24/19 (!) 112/56  09/28/19 124/75   Wt Readings from Last 3 Encounters:  03/29/20 219 lb 3.2 oz (99.4 kg)  10/24/19 213 lb 3.2 oz (96.7 kg)  09/28/19 215 lb 12.8 oz (97.9 kg)     He was last seen for hypertension 6 months ago.  BP at that visit was 124/75. Management since that visit includes; no changes were made. He reports excellent compliance with treatment. He is not having side effects.  He is not exercising. He is not adherent to low salt diet.   Outside blood pressures are not being checked regularly.  He does not smoke.  Use of agents associated with hypertension: NSAIDS.   --------------------------------------------------------------------------------------------------- Lipid/Cholesterol, follow-up  Last Lipid Panel: Lab Results  Component Value Date   CHOL 86 (L) 09/29/2019   LDLCALC 32 09/29/2019   HDL 36 (L) 09/29/2019   TRIG 94 09/29/2019    He was last seen for this 6 months ago.  Management since that visit includes; labs checked showing-Stable. He reports excellent compliance with treatment. He is not having side effects.  He is following a Regular diet. Current exercise: none  Last metabolic panel Lab Results  Component Value Date   GLUCOSE 122 (H) 09/29/2019   NA 141 09/29/2019   K 3.8 09/29/2019   BUN 18 09/29/2019   CREATININE 1.37 (H) 09/29/2019   GFRNONAA 47 (L) 09/29/2019   GFRAA 54 (L) 09/29/2019    CALCIUM 9.2 09/29/2019   AST 15 09/29/2019   ALT 18 09/29/2019   The ASCVD Risk score (Goff DC Jr., et al., 2013) failed to calculate for the following reasons:   The 2013 ASCVD risk score is only valid for ages 85 to 15  ---------------------------------------------------------------------------------------------------  BPH 09/28/2019-Try stopping fluid intake after dinner at night. Consider increasing Cardura dose.    Social History   Tobacco Use  . Smoking status: Never Smoker  . Smokeless tobacco: Never Used  Vaping Use  . Vaping Use: Never used  Substance Use Topics  . Alcohol use: Yes    Comment: drinks a beer occasionally  . Drug use: No       Medications: Outpatient Medications Prior to Visit  Medication Sig  . amLODipine (NORVASC) 5 MG tablet Take 1 tablet (5 mg total) by mouth every evening.  Marland Kitchen aspirin 81 MG tablet Take 81 mg by mouth daily.    . Cholecalciferol (VITAMIN D3) 2000 units TABS Take 1 tablet by mouth daily.  Marland Kitchen co-enzyme Q-10 30 MG capsule Take 30 mg by mouth daily.  . dorzolamide (TRUSOPT) 2 % ophthalmic solution Place 1 drop 2 (two) times daily into both eyes.   Marland Kitchen doxazosin (CARDURA) 2 MG tablet TAKE 1 TABLET EVERY DAY  . fluticasone (FLONASE) 50 MCG/ACT nasal spray PLACE 2 SPRAYS INTO BOTH NOSTRILS DAILY.  Marland Kitchen ibuprofen (ADVIL,MOTRIN) 200 MG tablet Take 200 mg by mouth daily as needed for moderate  pain.  . labetalol (NORMODYNE) 200 MG tablet TAKE 1 TABLET TWICE DAILY  . latanoprost (XALATAN) 0.005 % ophthalmic solution 1 drop at bedtime.    Marland Kitchen loratadine (CLARITIN) 10 MG tablet Take 1 tablet (10 mg total) by mouth daily.  Marland Kitchen LORazepam (ATIVAN) 0.5 MG tablet Take 1 tablet (0.5 mg total) by mouth at bedtime as needed. for sleep  . losartan-hydrochlorothiazide (HYZAAR) 100-25 MG tablet Take 1 tablet by mouth daily.  . meclizine (ANTIVERT) 25 MG tablet TAKE 1 TABLET DAILY AS NEEDED FOR DIZZINESS.  . Omega-3 Fatty Acids (FISH OIL) 1000 MG CAPS Take by  mouth daily.  . pantoprazole (PROTONIX) 20 MG tablet TAKE 1 TABLET EVERY DAY  . potassium chloride SA (KLOR-CON) 20 MEQ tablet TAKE 1 TABLET EVERY DAY  . rosuvastatin (CRESTOR) 40 MG tablet TAKE 1 TABLET EVERY DAY   No facility-administered medications prior to visit.    Review of Systems  Constitutional: Negative for appetite change, chills and fever.  HENT: Negative.        Mild eczema of the EAC of both ears  Eyes: Negative.   Respiratory: Negative for chest tightness, shortness of breath and wheezing.   Cardiovascular: Negative for chest pain and palpitations.  Gastrointestinal: Negative for abdominal pain, nausea and vomiting.  Endocrine: Negative.   Allergic/Immunologic: Negative.   Neurological: Negative.   Hematological: Negative.   Psychiatric/Behavioral: Negative.        Objective    BP 126/73 (BP Location: Left Arm, Patient Position: Sitting, Cuff Size: Large)   Pulse 68   Temp (!) 97.5 F (36.4 C) (Temporal)   Ht 5\' 8"  (1.727 m)   Wt 219 lb 3.2 oz (99.4 kg)   BMI 33.33 kg/m  BP Readings from Last 3 Encounters:  03/29/20 126/73  10/24/19 (!) 112/56  09/28/19 124/75   Wt Readings from Last 3 Encounters:  03/29/20 219 lb 3.2 oz (99.4 kg)  10/24/19 213 lb 3.2 oz (96.7 kg)  09/28/19 215 lb 12.8 oz (97.9 kg)      Physical Exam Vitals reviewed.  Constitutional:      Appearance: He is well-developed.  HENT:     Head: Normocephalic and atraumatic.     Right Ear: External ear normal.     Left Ear: External ear normal.  Eyes:     General: No scleral icterus.    Conjunctiva/sclera: Conjunctivae normal.  Neck:     Thyroid: No thyromegaly.  Cardiovascular:     Rate and Rhythm: Normal rate and regular rhythm.     Heart sounds: Normal heart sounds.  Pulmonary:     Effort: Pulmonary effort is normal.     Breath sounds: Normal breath sounds.  Abdominal:     Palpations: Abdomen is soft.  Musculoskeletal:     Comments: He has had a lipoma at the proximal  portion of the left clavicle for years.  He asked me to check this again for him today.  Skin:    General: Skin is warm and dry.     Comments: Many SKs.  Neurological:     General: No focal deficit present.     Mental Status: He is alert and oriented to person, place, and time.  Psychiatric:        Mood and Affect: Mood normal.        Behavior: Behavior normal.        Thought Content: Thought content normal.        Judgment: Judgment normal.  No results found for any visits on 03/29/20.  Assessment & Plan     1. Hyperlipidemia, unspecified hyperlipidemia type On Crestor 40  2. Essential hypertension   3. Gastroesophageal reflux disease, unspecified whether esophagitis present   4. Eczema of both external ears  - mometasone (ELOCON) 0.1 % cream; APPLY TO ITCHY EARS TWICE DAILY FOR TWELVE DAYS. MAY REPEAT AS NEEDED FOR ITCHY EARS.  Dispense: 45 g; Refill: 2  5. Lipoma of torso   6. Atherosclerosis of coronary artery bypass graft of native heart without angina pectoris All risk factors treated.   No follow-ups on file.      I, [name], MD, have reviewed all documentation for this visit. The documentation on 04/01/20 for the exam, diagnosis, procedures, and orders are all accurate and complete.    Richard Cranford Mon, MD  [facility] [phone] (phone) [phone] (fax)  [place]

## 2020-03-28 ENCOUNTER — Other Ambulatory Visit: Payer: Self-pay | Admitting: Family Medicine

## 2020-03-28 DIAGNOSIS — R42 Dizziness and giddiness: Secondary | ICD-10-CM

## 2020-03-28 NOTE — Telephone Encounter (Signed)
Requested medication (s) are due for refill today - should have partial Rx on hand  Requested medication (s) are on the active medication list -yes  Future visit scheduled -yes  Last refill: 02/02/20  Notes to clinic: Request non delegated Rx  Requested Prescriptions  Pending Prescriptions Disp Refills   meclizine (ANTIVERT) 25 MG tablet [Pharmacy Med Name: MECLIZINE HYDROCHLORIDE 25 MG Tablet] 90 tablet 3    Sig: TAKE 1 TABLET DAILY AS NEEDED FOR DIZZINESS.      Not Delegated - Gastroenterology: Antiemetics Failed - 03/28/2020  3:29 PM      Failed - This refill cannot be delegated      Failed - Valid encounter within last 6 months    Recent Outpatient Visits           6 months ago Annual physical exam   [facility] Jerrol Banana., MD   1 year ago Essential hypertension   [facility] Jerrol Banana., MD   1 year ago Essential hypertension   [facility] Jerrol Banana., MD   1 year ago Dizzy   [facility] Jerrol Banana., MD   2 years ago Essential hypertension   [facility] Jerrol Banana., MD       Future Appointments             Tomorrow Jerrol Banana., MD [facility]                Requested Prescriptions  Pending Prescriptions Disp Refills   meclizine (ANTIVERT) 25 MG tablet [Pharmacy Med Name: MECLIZINE HYDROCHLORIDE 25 MG Tablet] 90 tablet 3    Sig: TAKE 1 TABLET DAILY AS NEEDED FOR DIZZINESS.      Not Delegated - Gastroenterology: Antiemetics Failed - 03/28/2020  3:29 PM      Failed - This refill cannot be delegated      Failed - Valid encounter within last 6 months    Recent Outpatient Visits           6 months ago Annual physical exam   [facility] Jerrol Banana., MD   1 year ago Essential hypertension   [facility] Jerrol Banana., MD   1 year ago Essential  hypertension   [facility] Jerrol Banana., MD   1 year ago Dizzy   [facility] Jerrol Banana., MD   2 years ago Essential hypertension   [facility] Jerrol Banana., MD       Future Appointments             Tomorrow Jerrol Banana., MD [facility], [place]

## 2020-03-29 ENCOUNTER — Encounter: Payer: Self-pay | Admitting: Family Medicine

## 2020-03-29 ENCOUNTER — Ambulatory Visit (INDEPENDENT_AMBULATORY_CARE_PROVIDER_SITE_OTHER): Payer: Medicare HMO | Admitting: Family Medicine

## 2020-03-29 ENCOUNTER — Other Ambulatory Visit: Payer: Self-pay

## 2020-03-29 VITALS — BP 126/73 | HR 68 | Temp 97.5°F | Ht 68.0 in | Wt 219.2 lb

## 2020-03-29 DIAGNOSIS — E785 Hyperlipidemia, unspecified: Secondary | ICD-10-CM

## 2020-03-29 DIAGNOSIS — D171 Benign lipomatous neoplasm of skin and subcutaneous tissue of trunk: Secondary | ICD-10-CM | POA: Diagnosis not present

## 2020-03-29 DIAGNOSIS — I25810 Atherosclerosis of coronary artery bypass graft(s) without angina pectoris: Secondary | ICD-10-CM

## 2020-03-29 DIAGNOSIS — H60543 Acute eczematoid otitis externa, bilateral: Secondary | ICD-10-CM | POA: Diagnosis not present

## 2020-03-29 DIAGNOSIS — I10 Essential (primary) hypertension: Secondary | ICD-10-CM

## 2020-03-29 DIAGNOSIS — K219 Gastro-esophageal reflux disease without esophagitis: Secondary | ICD-10-CM

## 2020-03-29 MED ORDER — MOMETASONE FUROATE 0.1 % EX CREA: TOPICAL_CREAM | CUTANEOUS | 2 refills | Status: AC

## 2020-04-07 ENCOUNTER — Other Ambulatory Visit: Payer: Self-pay | Admitting: Family Medicine

## 2020-04-12 ENCOUNTER — Other Ambulatory Visit: Payer: Self-pay | Admitting: Family Medicine

## 2020-04-23 ENCOUNTER — Other Ambulatory Visit: Payer: Self-pay | Admitting: Family Medicine

## 2020-04-23 DIAGNOSIS — G4700 Insomnia, unspecified: Secondary | ICD-10-CM

## 2020-04-23 NOTE — Telephone Encounter (Signed)
[facility] faxed refill request for the following medications:  LORazepam (ATIVAN) 0.5 MG tablet  Please advise.

## 2020-04-25 ENCOUNTER — Telehealth: Payer: Self-pay | Admitting: Family Medicine

## 2020-04-25 NOTE — Telephone Encounter (Signed)
[facility] faxed refill request for the following medications:  LORazepam (ATIVAN) 0.5 MG tablet  Last Rx: 11/01/2019 LOV: 03/29/2020 Please advise. Thanks TNP

## 2020-04-25 NOTE — Telephone Encounter (Signed)
Refill request sent to pcp on 04/23/20.

## 2020-04-26 MED ORDER — LORAZEPAM 0.5 MG PO TABS: 0.5000 mg | ORAL_TABLET | Freq: Every evening | ORAL | 1 refills | Status: DC | PRN

## 2020-05-15 NOTE — Progress Notes (Signed)
Acute Office Visit  Subjective:    Patient ID: [name], male    DOB: Apr 19, 1933, 86 y.o.   MRN: [id number]  Chief Complaint  Patient presents with  . Melena    HPI Patient is in today for GI issue.  Patient has had black stool for about 7-10 days.  He has not noticed any blood.  He does state that last week he felt drained and his legs were weak for about a day. He further states that this happened on a very hot day and may just have been the heat.  Past Medical History:  Diagnosis Date  . Anemia, unspecified   . Anxiety state, unspecified   . Cancer ([place])    skin  . Coronary atherosclerosis   . GERD (gastroesophageal reflux disease)   . Osteoarthritis    group B streptococcal septic arthritis  . Unspecified essential hypertension     No past surgical history on file.  Family History  Problem Relation Age of Onset  . Parkinson's disease Mother   . Heart disease Mother   . Dementia Mother   . Cancer Father        lung cancer, smoker  . Depression Sister   . Fibromyalgia Sister   . Arthritis Brother        B/L knee replacement  . Sleep apnea Brother     Social History   Socioeconomic History  . Marital status: Divorced    Spouse name: Not on file  . Number of children: 3  . Years of education: Not on file  . Highest education level: Some college, no degree  Occupational History  . Occupation: retired  Tobacco Use  . Smoking status: Never Smoker  . Smokeless tobacco: Never Used  Vaping Use  . Vaping Use: Never used  Substance and Sexual Activity  . Alcohol use: Yes    Comment: drinks a beer occasionally  . Drug use: No  . Sexual activity: Not on file  Other Topics Concern  . Not on file  Social History Narrative  . Not on file   Social Determinants of Health   Financial Resource Strain:   . Difficulty of Paying Living Expenses:   Food Insecurity:   . Worried About Charity fundraiser in the Last Year:   . Arboriculturist in the Last Year:     Transportation Needs:   . Film/video editor (Medical):   Marland Kitchen Lack of Transportation (Non-Medical):   Physical Activity:   . Days of Exercise per Week:   . Minutes of Exercise per Session:   Stress:   . Feeling of Stress :   Social Connections:   . Frequency of Communication with Friends and Family:   . Frequency of Social Gatherings with Friends and Family:   . Attends Religious Services:   . Active Member of Clubs or Organizations:   . Attends Archivist Meetings:   Marland Kitchen Marital Status:   Intimate Partner Violence:   . Fear of Current or Ex-Partner:   . Emotionally Abused:   Marland Kitchen Physically Abused:   . Sexually Abused:     Outpatient Medications Prior to Visit  Medication Sig Dispense Refill  . amLODipine (NORVASC) 5 MG tablet Take 1 tablet (5 mg total) by mouth every evening. 90 tablet 3  . aspirin 81 MG tablet Take 81 mg by mouth daily.      . Cholecalciferol (VITAMIN D3) 2000 units TABS Take 1 tablet by mouth  daily.    . co-enzyme Q-10 30 MG capsule Take 30 mg by mouth daily.    . dorzolamide (TRUSOPT) 2 % ophthalmic solution Place 1 drop 2 (two) times daily into both eyes.     Marland Kitchen doxazosin (CARDURA) 2 MG tablet TAKE 1 TABLET EVERY DAY 90 tablet 1  . fluticasone (FLONASE) 50 MCG/ACT nasal spray PLACE 2 SPRAYS INTO BOTH NOSTRILS DAILY. 48 g 1  . ibuprofen (ADVIL,MOTRIN) 200 MG tablet Take 200 mg by mouth daily as needed for moderate pain.    Marland Kitchen labetalol (NORMODYNE) 200 MG tablet TAKE 1 TABLET TWICE DAILY 180 tablet 1  . latanoprost (XALATAN) 0.005 % ophthalmic solution 1 drop at bedtime.      Marland Kitchen loratadine (CLARITIN) 10 MG tablet Take 1 tablet (10 mg total) by mouth daily. 30 tablet 11  . LORazepam (ATIVAN) 0.5 MG tablet Take 1 tablet (0.5 mg total) by mouth at bedtime as needed. for sleep 90 tablet 1  . losartan-hydrochlorothiazide (HYZAAR) 100-25 MG tablet Take 1 tablet by mouth daily. 90 tablet 3  . meclizine (ANTIVERT) 25 MG tablet TAKE 1 TABLET DAILY AS NEEDED FOR  DIZZINESS. 90 tablet 3  . mometasone (ELOCON) 0.1 % cream APPLY TO ITCHY EARS TWICE DAILY FOR TWELVE DAYS. MAY REPEAT AS NEEDED FOR ITCHY EARS. 45 g 2  . Omega-3 Fatty Acids (FISH OIL) 1000 MG CAPS Take by mouth daily.    . pantoprazole (PROTONIX) 20 MG tablet TAKE 1 TABLET EVERY DAY 90 tablet 3  . potassium chloride SA (KLOR-CON) 20 MEQ tablet TAKE 1 TABLET EVERY DAY 90 tablet 3  . rosuvastatin (CRESTOR) 40 MG tablet TAKE 1 TABLET EVERY DAY 90 tablet 3   No facility-administered medications prior to visit.    No Known Allergies  Review of Systems  Gastrointestinal: Negative for abdominal distention, abdominal pain, anal bleeding, blood in stool, constipation, diarrhea, nausea, rectal pain and vomiting.       Black stool  Allergic/Immunologic: Negative.   Psychiatric/Behavioral: Negative.        Objective:    Physical Exam Vitals reviewed.  Constitutional:      Appearance: He is well-developed.  HENT:     Head: Normocephalic and atraumatic.     Right Ear: External ear normal.     Left Ear: External ear normal.  Eyes:     General: No scleral icterus.    Conjunctiva/sclera: Conjunctivae normal.  Neck:     Thyroid: No thyromegaly.  Cardiovascular:     Rate and Rhythm: Normal rate and regular rhythm.     Heart sounds: Normal heart sounds.  Pulmonary:     Effort: Pulmonary effort is normal.     Breath sounds: Normal breath sounds.  Abdominal:     Palpations: Abdomen is soft.     Comments: Very very minimal epigastric tenderness  Genitourinary:    Prostate: Normal.     Rectum: Normal. Guaiac result negative.  Skin:    General: Skin is warm and dry.     Comments: Many SKs.  Neurological:     Mental Status: He is alert and oriented to person, place, and time.  Psychiatric:        Behavior: Behavior normal.        Thought Content: Thought content normal.        Judgment: Judgment normal.     BP 120/70 (BP Location: Right Arm, Patient Position: Sitting, Cuff Size:  Normal)   Pulse 74   Temp 98.2 F (36.8 C) (Oral)  Wt 215 lb (97.5 kg)   SpO2 95%   BMI 32.69 kg/m  Wt Readings from Last 3 Encounters:  05/16/20 215 lb (97.5 kg)  03/29/20 219 lb 3.2 oz (99.4 kg)  10/24/19 213 lb 3.2 oz (96.7 kg)    Health Maintenance Due  Topic Date Due  . INFLUENZA VACCINE  05/13/2020    There are no preventive care reminders to display for this patient.   Lab Results  Component Value Date   TSH 1.840 09/29/2019   Lab Results  Component Value Date   WBC 5.3 09/29/2019   HGB 13.4 09/29/2019   HCT 39.6 09/29/2019   MCV 90 09/29/2019   PLT 218 09/29/2019   Lab Results  Component Value Date   NA 141 09/29/2019   K 3.8 09/29/2019   CO2 21 09/29/2019   GLUCOSE 122 (H) 09/29/2019   BUN 18 09/29/2019   CREATININE 1.37 (H) 09/29/2019   BILITOT 0.5 09/29/2019   ALKPHOS 87 09/29/2019   AST 15 09/29/2019   ALT 18 09/29/2019   PROT 6.5 09/29/2019   ALBUMIN 4.3 09/29/2019   CALCIUM 9.2 09/29/2019   ANIONGAP 9 12/02/2018   Lab Results  Component Value Date   CHOL 86 (L) 09/29/2019   Lab Results  Component Value Date   HDL 36 (L) 09/29/2019   Lab Results  Component Value Date   LDLCALC 32 09/29/2019   Lab Results  Component Value Date   TRIG 94 09/29/2019   Lab Results  Component Value Date   CHOLHDL 2.4 09/29/2019   Lab Results  Component Value Date   HGBA1C 6.0 (H) 08/07/2016       Assessment & Plan:   Problem List Items Addressed This Visit    None    1. Melena OC light is negative but we will assume this is upper GI issue and change from daily pantoprazole to twice a day omeprazole.  Refer to GI and I will see him back in about 3 weeks. - CBC with Differential/Platelet - IFOBT POC (occult bld, rslt in office); Future - Ambulatory referral to Gastroenterology - IFOBT POC (occult bld, rslt in office)  2. Gastroesophageal reflux disease, unspecified whether esophagitis present Gastritis very likely.  As above.  3. Black  stools Obtain CBC    No orders of the defined types were placed in this encounter.    Juluis Mire, CMA  I have done the exam and reviewed the chart and it is accurate to the best of my knowledge. Development worker, community has been used and  any errors in dictation or transcription are unintentional. Miguel Aschoff M.D. [facility]

## 2020-05-16 ENCOUNTER — Other Ambulatory Visit: Payer: Self-pay

## 2020-05-16 ENCOUNTER — Ambulatory Visit (INDEPENDENT_AMBULATORY_CARE_PROVIDER_SITE_OTHER): Payer: Medicare HMO | Admitting: Family Medicine

## 2020-05-16 ENCOUNTER — Telehealth: Payer: Self-pay | Admitting: Family Medicine

## 2020-05-16 ENCOUNTER — Other Ambulatory Visit: Payer: Self-pay | Admitting: Family Medicine

## 2020-05-16 VITALS — BP 120/70 | HR 74 | Temp 98.2°F | Wt 215.0 lb

## 2020-05-16 DIAGNOSIS — K219 Gastro-esophageal reflux disease without esophagitis: Secondary | ICD-10-CM

## 2020-05-16 DIAGNOSIS — K921 Melena: Secondary | ICD-10-CM | POA: Diagnosis not present

## 2020-05-16 DIAGNOSIS — R42 Dizziness and giddiness: Secondary | ICD-10-CM

## 2020-05-16 LAB — IFOBT (OCCULT BLOOD): IFOBT: NEGATIVE

## 2020-05-16 MED ORDER — OMEPRAZOLE 20 MG PO CPDR: 20.0000 mg | DELAYED_RELEASE_CAPSULE | Freq: Two times a day (BID) | ORAL | 5 refills | Status: DC

## 2020-05-16 MED ORDER — OMEPRAZOLE 20 MG PO CPDR: 20.0000 mg | DELAYED_RELEASE_CAPSULE | Freq: Two times a day (BID) | ORAL | 3 refills | Status: DC

## 2020-05-16 NOTE — Telephone Encounter (Signed)
General/Other - Patient called and wants [name] to call him back. He says he may know what the problem is. Please advise

## 2020-05-16 NOTE — Patient Instructions (Addendum)
Stop pantoprazole. Start omeprazole 20 mg twice a day.

## 2020-05-16 NOTE — Telephone Encounter (Signed)
Lmtcb.

## 2020-05-16 NOTE — Telephone Encounter (Signed)
Patient came back in the office to say he had taken the Omeprazole before and not sure why Dr. Rosanna Randy had him stop it.   Now, Dr. Rosanna Randy has put him back on it. He wants Jiles Garter to call and let him know if he should start back on this.   He is getting his first months worth at [facility] but wants a 90 day prescription sent to [facility] please.

## 2020-05-16 NOTE — Telephone Encounter (Signed)
Patient requesting to speak with [facility].

## 2020-05-17 LAB — CBC WITH DIFFERENTIAL/PLATELET
Basophils Absolute: 0 10*3/uL (ref 0.0–0.2)
Basos: 1 %
EOS (ABSOLUTE): 0.2 10*3/uL (ref 0.0–0.4)
Eos: 4 %
Hematocrit: 41.2 % (ref 37.5–51.0)
Hemoglobin: 14.1 g/dL (ref 13.0–17.7)
Immature Grans (Abs): 0 10*3/uL (ref 0.0–0.1)
Immature Granulocytes: 1 %
Lymphocytes Absolute: 0.7 10*3/uL (ref 0.7–3.1)
Lymphs: 12 %
MCH: 31.3 pg (ref 26.6–33.0)
MCHC: 34.2 g/dL (ref 31.5–35.7)
MCV: 92 fL (ref 79–97)
Monocytes Absolute: 0.5 10*3/uL (ref 0.1–0.9)
Monocytes: 8 %
Neutrophils Absolute: 4.5 10*3/uL (ref 1.4–7.0)
Neutrophils: 74 %
Platelets: 212 10*3/uL (ref 150–450)
RBC: 4.5 x10E6/uL (ref 4.14–5.80)
RDW: 14 % (ref 11.6–15.4)
WBC: 6 10*3/uL (ref 3.4–10.8)

## 2020-05-21 ENCOUNTER — Telehealth: Payer: Self-pay

## 2020-05-21 NOTE — Telephone Encounter (Signed)
Patient advised.

## 2020-05-21 NOTE — Telephone Encounter (Signed)
-----   Message from Jerrol Banana., MD sent at 05/20/2020  6:51 PM EDT ----- CBC normal

## 2020-05-31 ENCOUNTER — Other Ambulatory Visit: Payer: Self-pay | Admitting: Cardiology

## 2020-06-04 DIAGNOSIS — H40153 Residual stage of open-angle glaucoma, bilateral: Secondary | ICD-10-CM | POA: Diagnosis not present

## 2020-06-06 DIAGNOSIS — D2271 Melanocytic nevi of right lower limb, including hip: Secondary | ICD-10-CM | POA: Diagnosis not present

## 2020-06-06 DIAGNOSIS — D0461 Carcinoma in situ of skin of right upper limb, including shoulder: Secondary | ICD-10-CM | POA: Diagnosis not present

## 2020-06-06 DIAGNOSIS — Z85828 Personal history of other malignant neoplasm of skin: Secondary | ICD-10-CM | POA: Diagnosis not present

## 2020-06-06 DIAGNOSIS — D2261 Melanocytic nevi of right upper limb, including shoulder: Secondary | ICD-10-CM | POA: Diagnosis not present

## 2020-06-06 DIAGNOSIS — D485 Neoplasm of uncertain behavior of skin: Secondary | ICD-10-CM | POA: Diagnosis not present

## 2020-06-06 DIAGNOSIS — D2262 Melanocytic nevi of left upper limb, including shoulder: Secondary | ICD-10-CM | POA: Diagnosis not present

## 2020-06-06 DIAGNOSIS — D225 Melanocytic nevi of trunk: Secondary | ICD-10-CM | POA: Diagnosis not present

## 2020-06-06 DIAGNOSIS — L538 Other specified erythematous conditions: Secondary | ICD-10-CM | POA: Diagnosis not present

## 2020-06-06 DIAGNOSIS — C44319 Basal cell carcinoma of skin of other parts of face: Secondary | ICD-10-CM | POA: Diagnosis not present

## 2020-06-06 DIAGNOSIS — L570 Actinic keratosis: Secondary | ICD-10-CM | POA: Diagnosis not present

## 2020-06-06 DIAGNOSIS — L820 Inflamed seborrheic keratosis: Secondary | ICD-10-CM | POA: Diagnosis not present

## 2020-06-08 NOTE — Progress Notes (Signed)
I,[name],acting as a scribe for [name], MD.,have documented all relevant documentation on the behalf of [name], MD,as directed by  [name], MD while in the presence of [name], MD.   Established patient visit   Patient: [name]   DOB: Jan 16, 1933   86 y.o. Male  MRN: [id number] Visit Date: 06/13/2020  Today's healthcare provider: [name], MD   Chief Complaint  Patient presents with  . Follow-up  . Melena   Subjective    HPI  Patient is feeling much better than his last visit.  He was eating a lot of Oreos and felt that was the reason for his dark stool.  His stool was OC negative in the office.  He wishes to not proceed with the GI work-up.  Labs were also normal. Melena From 05/16/2020-OC light is negative but we will assume this is upper GI issue and change from daily pantoprazole to twice a day omeprazole.  Referred to GI and I will see him back in about 3 weeks.  Patient states he discovered that a certain cookie he was eating was causing him to have discolored stool. Patient states he stopped eating the cookie and his issue has resolved.   Past Medical History:  Diagnosis Date  . Anemia, unspecified   . Anxiety state, unspecified   . Cancer ([place])    skin  . Coronary atherosclerosis   . GERD (gastroesophageal reflux disease)   . Osteoarthritis    group B streptococcal septic arthritis  . Unspecified essential hypertension        Medications: Outpatient Medications Prior to Visit  Medication Sig  . amLODipine (NORVASC) 5 MG tablet Take 1 tablet (5 mg total) by mouth every evening.  Marland Kitchen aspirin 81 MG tablet Take 81 mg by mouth daily.    . Cholecalciferol (VITAMIN D3) 2000 units TABS Take 1 tablet by mouth daily.  Marland Kitchen co-enzyme Q-10 30 MG capsule Take 30 mg by mouth daily.  . dorzolamide (TRUSOPT) 2 % ophthalmic solution Place 1 drop 2 (two) times daily into both eyes.   Marland Kitchen doxazosin (CARDURA) 2 MG tablet  TAKE 1 TABLET EVERY DAY  . fluticasone (FLONASE) 50 MCG/ACT nasal spray USE 2 SPRAYS IN EACH NOSTRIL EVERY DAY  . ibuprofen (ADVIL,MOTRIN) 200 MG tablet Take 200 mg by mouth daily as needed for moderate pain.  Marland Kitchen labetalol (NORMODYNE) 200 MG tablet TAKE 1 TABLET TWICE DAILY  . latanoprost (XALATAN) 0.005 % ophthalmic solution 1 drop at bedtime.    Marland Kitchen loratadine (CLARITIN) 10 MG tablet Take 1 tablet (10 mg total) by mouth daily.  Marland Kitchen LORazepam (ATIVAN) 0.5 MG tablet Take 1 tablet (0.5 mg total) by mouth at bedtime as needed. for sleep  . losartan-hydrochlorothiazide (HYZAAR) 100-25 MG tablet Take 1 tablet by mouth daily.  . meclizine (ANTIVERT) 25 MG tablet TAKE 1 TABLET DAILY AS NEEDED FOR DIZZINESS.  . mometasone (ELOCON) 0.1 % cream APPLY TO ITCHY EARS TWICE DAILY FOR TWELVE DAYS. MAY REPEAT AS NEEDED FOR ITCHY EARS.  Marland Kitchen Omega-3 Fatty Acids (FISH OIL) 1000 MG CAPS Take by mouth daily.  Marland Kitchen omeprazole (PRILOSEC) 20 MG capsule Take 1 capsule (20 mg total) by mouth 2 (two) times daily before a meal.  . potassium chloride SA (KLOR-CON) 20 MEQ tablet TAKE 1 TABLET EVERY DAY  . rosuvastatin (CRESTOR) 40 MG tablet TAKE 1 TABLET EVERY DAY   No facility-administered medications prior to visit.    Review  of Systems  Constitutional: Negative for appetite change, chills and fever.  Respiratory: Negative for chest tightness, shortness of breath and wheezing.   Cardiovascular: Negative for chest pain and palpitations.  Gastrointestinal: Negative for abdominal pain, nausea and vomiting.    Last lipids Lab Results  Component Value Date   CHOL 86 (L) 09/29/2019   HDL 36 (L) 09/29/2019   LDLCALC 32 09/29/2019   TRIG 94 09/29/2019   CHOLHDL 2.4 09/29/2019      Objective    BP 137/74 (BP Location: Right Arm, Patient Position: Sitting, Cuff Size: Large)   Pulse 65   Temp 98.2 F (36.8 C) (Oral)   Resp 18   Ht 5\' 8"  (1.727 m)   Wt 216 lb (98 kg)   SpO2 97%   BMI 32.84 kg/m  BP Readings from Last  3 Encounters:  06/13/20 137/74  05/16/20 120/70  03/29/20 126/73   Wt Readings from Last 3 Encounters:  06/13/20 216 lb (98 kg)  05/16/20 215 lb (97.5 kg)  03/29/20 219 lb 3.2 oz (99.4 kg)      Physical Exam Vitals reviewed.  Constitutional:      Appearance: He is well-developed.  HENT:     Head: Normocephalic and atraumatic.     Right Ear: External ear normal.     Left Ear: External ear normal.  Eyes:     General: No scleral icterus.    Conjunctiva/sclera: Conjunctivae normal.  Neck:     Thyroid: No thyromegaly.  Cardiovascular:     Rate and Rhythm: Normal rate and regular rhythm.     Heart sounds: Normal heart sounds.  Pulmonary:     Effort: Pulmonary effort is normal.     Breath sounds: Normal breath sounds.  Abdominal:     Palpations: Abdomen is soft.     Tenderness: There is no abdominal tenderness.  Genitourinary:    Prostate: Normal.     Rectum: Normal. Guaiac result negative.  Skin:    General: Skin is warm and dry.     Comments: Many SKs.  Neurological:     Mental Status: He is alert and oriented to person, place, and time.  Psychiatric:        Behavior: Behavior normal.        Thought Content: Thought content normal.        Judgment: Judgment normal.       No results found for any visits on 06/13/20.  Assessment & Plan     1. Black stools This is resolved.  No further work-up necessary.  2. Gastroesophageal reflux disease, unspecified whether esophagitis present Finish omeprazole and then stop.  Restart pantoprazole daily  3. Atherosclerosis of coronary artery bypass graft of native heart without angina pectoris All risk factors treated   No follow-ups on file.         Xaniyah Buchholz Cranford Mon, MD  [facility] [phone] (phone) [phone] (fax)  [place]

## 2020-06-13 ENCOUNTER — Encounter: Payer: Self-pay | Admitting: Family Medicine

## 2020-06-13 ENCOUNTER — Other Ambulatory Visit: Payer: Self-pay

## 2020-06-13 ENCOUNTER — Ambulatory Visit (INDEPENDENT_AMBULATORY_CARE_PROVIDER_SITE_OTHER): Payer: Medicare HMO | Admitting: Family Medicine

## 2020-06-13 VITALS — BP 137/74 | HR 65 | Temp 98.2°F | Resp 18 | Ht 68.0 in | Wt 216.0 lb

## 2020-06-13 DIAGNOSIS — K921 Melena: Secondary | ICD-10-CM | POA: Diagnosis not present

## 2020-06-13 DIAGNOSIS — I25810 Atherosclerosis of coronary artery bypass graft(s) without angina pectoris: Secondary | ICD-10-CM

## 2020-06-13 DIAGNOSIS — K219 Gastro-esophageal reflux disease without esophagitis: Secondary | ICD-10-CM

## 2020-06-13 NOTE — Patient Instructions (Signed)
STOP OMEPRAZOLE !!!  RESTART PANTOPRAZOLE DAILY !!!

## 2020-07-02 ENCOUNTER — Other Ambulatory Visit: Payer: Self-pay | Admitting: Cardiology

## 2020-07-02 ENCOUNTER — Other Ambulatory Visit: Payer: Self-pay | Admitting: Family Medicine

## 2020-07-02 DIAGNOSIS — K219 Gastro-esophageal reflux disease without esophagitis: Secondary | ICD-10-CM

## 2020-07-02 NOTE — Telephone Encounter (Signed)
Requested Prescriptions  Pending Prescriptions Disp Refills  . pantoprazole (PROTONIX) 20 MG tablet [Pharmacy Med Name: PANTOPRAZOLE SODIUM 20 MG Tablet Delayed Release] 90 tablet     Sig: TAKE 1 TABLET EVERY DAY     Gastroenterology: Proton Pump Inhibitors Passed - 07/02/2020  7:58 PM      Passed - Valid encounter within last 12 months    Recent Outpatient Visits          2 weeks ago Black stools   [facility] Jerrol Banana., MD   1 month ago [facility] Jerrol Banana., MD   3 months ago Hyperlipidemia, unspecified hyperlipidemia type   [facility] Jerrol Banana., MD   9 months ago Annual physical exam   [facility] Jerrol Banana., MD   1 year ago Essential hypertension   [facility] Jerrol Banana., MD             . doxazosin (CARDURA) 2 MG tablet [Pharmacy Med Name: DOXAZOSIN MESYLATE 2 MG Tablet] 90 tablet 1    Sig: TAKE 1 TABLET EVERY DAY     Cardiovascular:  Alpha Blockers Passed - 07/02/2020  7:58 PM      Passed - Last BP in normal range    BP Readings from Last 1 Encounters:  06/13/20 137/74         Passed - Valid encounter within last 6 months    Recent Outpatient Visits          2 weeks ago Black stools   [facility] Jerrol Banana., MD   1 month ago Melena   [facility] Jerrol Banana., MD   3 months ago Hyperlipidemia, unspecified hyperlipidemia type   [facility] Jerrol Banana., MD   9 months ago Annual physical exam   [facility] Jerrol Banana., MD   1 year ago Essential hypertension   [facility] Jerrol Banana., MD             . labetalol ([place]) 200 MG tablet [Pharmacy Med Name: LABETALOL HYDROCHLORIDE 200 MG Tablet] 180 tablet 1    Sig: TAKE 1 TABLET TWICE DAILY     Cardiovascular:  Beta Blockers Passed -  07/02/2020  7:58 PM      Passed - Last BP in normal range    BP Readings from Last 1 Encounters:  06/13/20 137/74         Passed - Last Heart Rate in normal range    Pulse Readings from Last 1 Encounters:  06/13/20 65         Passed - Valid encounter within last 6 months    Recent Outpatient Visits          2 weeks ago Black stools   [facility] Jerrol Banana., MD   1 month ago Melena   [facility] Jerrol Banana., MD   3 months ago Hyperlipidemia, unspecified hyperlipidemia type   [facility] Jerrol Banana., MD   9 months ago Annual physical exam   [facility] Jerrol Banana., MD   1 year ago Essential hypertension   [facility] Jerrol Banana., MD

## 2020-07-02 NOTE — Telephone Encounter (Signed)
Requested medications are due for refill today?  Unsure - not on active medication list.    Requested medications are on active medication list?  No   Last Refill:   Unsure.    Future visit scheduled?  No   Notes to Clinic:  When patient was seen by Dr. Rosanna Randy two weeks ago  Dr. Rosanna Randy instructed patient to complete course of Omeprazole and then resume protonix.  Protonix is not on active medication.  Please review Dr. Alben Spittle note and order Protonix.  Thank you.

## 2020-07-17 DIAGNOSIS — C44319 Basal cell carcinoma of skin of other parts of face: Secondary | ICD-10-CM | POA: Diagnosis not present

## 2020-07-23 DIAGNOSIS — D0461 Carcinoma in situ of skin of right upper limb, including shoulder: Secondary | ICD-10-CM | POA: Diagnosis not present

## 2020-07-26 ENCOUNTER — Ambulatory Visit: Payer: Medicare HMO | Admitting: Gastroenterology

## 2020-08-07 ENCOUNTER — Emergency Department
Admission: EM | Admit: 2020-08-07 | Discharge: 2020-08-07 | Disposition: A | Discharge status: Home / Self Care | Payer: Medicare HMO | Attending: Emergency Medicine | Admitting: Emergency Medicine

## 2020-08-07 ENCOUNTER — Encounter: Payer: Self-pay | Admitting: *Deleted

## 2020-08-07 ENCOUNTER — Emergency Department: Payer: Medicare HMO

## 2020-08-07 ENCOUNTER — Other Ambulatory Visit: Payer: Self-pay

## 2020-08-07 DIAGNOSIS — J9811 Atelectasis: Secondary | ICD-10-CM | POA: Diagnosis not present

## 2020-08-07 DIAGNOSIS — I10 Essential (primary) hypertension: Secondary | ICD-10-CM | POA: Diagnosis not present

## 2020-08-07 DIAGNOSIS — Z7982 Long term (current) use of aspirin: Secondary | ICD-10-CM | POA: Insufficient documentation

## 2020-08-07 DIAGNOSIS — Z85828 Personal history of other malignant neoplasm of skin: Secondary | ICD-10-CM | POA: Insufficient documentation

## 2020-08-07 DIAGNOSIS — R079 Chest pain, unspecified: Secondary | ICD-10-CM | POA: Insufficient documentation

## 2020-08-07 DIAGNOSIS — Z79899 Other long term (current) drug therapy: Secondary | ICD-10-CM | POA: Insufficient documentation

## 2020-08-07 LAB — COMPREHENSIVE METABOLIC PANEL
ALT: 32 U/L (ref 0–44)
AST: 23 U/L (ref 15–41)
Albumin: 4.1 g/dL (ref 3.5–5.0)
Alkaline Phosphatase: 65 U/L (ref 38–126)
Anion gap: 11 (ref 5–15)
BUN: 21 mg/dL (ref 8–23)
CO2: 23 mmol/L (ref 22–32)
Calcium: 9 mg/dL (ref 8.9–10.3)
Chloride: 103 mmol/L (ref 98–111)
Creatinine, Ser: 1.32 mg/dL — ABNORMAL HIGH (ref 0.61–1.24)
GFR, Estimated: 53 mL/min — ABNORMAL LOW (ref >60)
Glucose, Bld: 164 mg/dL — ABNORMAL HIGH (ref 70–99)
Potassium: 3.6 mmol/L (ref 3.5–5.1)
Sodium: 137 mmol/L (ref 135–145)
Total Bilirubin: 0.7 mg/dL (ref 0.3–1.2)
Total Protein: 7.2 g/dL (ref 6.5–8.1)

## 2020-08-07 LAB — CBC WITH DIFFERENTIAL/PLATELET
Abs Immature Granulocytes: 0.02 10*3/uL (ref 0.00–0.07)
Basophils Absolute: 0 10*3/uL (ref 0.0–0.1)
Basophils Relative: 1 %
Eosinophils Absolute: 0.2 10*3/uL (ref 0.0–0.5)
Eosinophils Relative: 4 %
HCT: 40.5 % (ref 39.0–52.0)
Hemoglobin: 13.8 g/dL (ref 13.0–17.0)
Immature Granulocytes: 0 %
Lymphocytes Relative: 24 %
Lymphs Abs: 1.2 10*3/uL (ref 0.7–4.0)
MCH: 31.4 pg (ref 26.0–34.0)
MCHC: 34.1 g/dL (ref 30.0–36.0)
MCV: 92 fL (ref 80.0–100.0)
Monocytes Absolute: 0.6 10*3/uL (ref 0.1–1.0)
Monocytes Relative: 12 %
Neutro Abs: 2.9 10*3/uL (ref 1.7–7.7)
Neutrophils Relative %: 59 %
Platelets: 204 10*3/uL (ref 150–400)
RBC: 4.4 MIL/uL (ref 4.22–5.81)
RDW: 14 % (ref 11.5–15.5)
WBC: 4.9 10*3/uL (ref 4.0–10.5)
nRBC: 0 % (ref 0.0–0.2)

## 2020-08-07 LAB — TROPONIN I (HIGH SENSITIVITY)
Troponin I (High Sensitivity): 13 ng/L (ref <18)
Troponin I (High Sensitivity): 12 ng/L (ref <18)

## 2020-08-07 NOTE — Discharge Instructions (Addendum)
Your EKG and blood work did not show any signs of a heart attack.  However you have not had a catheterization or stress test since your CABG.  Therefore it is very important they follow-up with your cardiologist as soon as possible to rule out a blockage.  Make sure to call his office first thing in the morning for an appointment within the next week.  Continue your medications with no changes.  Return to the emergency room for any new episodes of chest pain.

## 2020-08-07 NOTE — ED Provider Notes (Signed)
[facility] Emergency Department Provider Note  ____________________________________________  Time seen: Approximately 4:24 AM  I have reviewed the triage vital signs and the nursing notes.   HISTORY  Chief Complaint Chest Pain   HPI [name] is a 86 y.o. male with a history of CAD status post CABG in 1997, atrial flutter status post ablation, hypertension, anemia who presents for evaluation of chest pain.  Patient reports that he was laying down when he developed chest pain which she describes as sharp, substernal, lasting 30 to 40 minutes and resolving without intervention.  Patient denies any radiation of the pain.  Denies any shortness of breath, clammy, diaphoresis, nausea, vomiting, dizziness.  Patient says "I did not think there was anything wrong with me but since I live alone I wanted to get checked out."  Denies any back pain, numbness or paresthesias of his extremities, neurological deficits, abdominal pain, fever or chills.  No personal family history of PE or DVT, no recent travel immobilization, no leg pain or swelling, no hemoptysis or exogenous hormones.  Patient has no pain at this time.   Patient took a full aspirin at the onset of the pain.  Past Medical History:  Diagnosis Date  . Anemia, unspecified   . Anxiety state, unspecified   . Cancer ([place])    skin  . Coronary atherosclerosis   . GERD (gastroesophageal reflux disease)   . Osteoarthritis    group B streptococcal septic arthritis  . Unspecified essential hypertension     Patient Active Problem List   Diagnosis Date Noted  . Arthropathy of hand 06/11/2015  . Arteriosclerosis of coronary artery 06/11/2015  . Impotence of organic origin 06/11/2015  . Generalized anxiety disorder 06/11/2015  . Acid reflux 06/11/2015  . History of other malignant neoplasm of skin 06/11/2015  . HLD (hyperlipidemia) 06/11/2015  . Arthritis, degenerative 06/11/2015  . Adiposity 06/11/2015  .  Venous insufficiency of leg 06/11/2015  . HYPERLIPIDEMIA 08/16/2009  . ANEMIA 08/16/2009  . ANXIETY 08/16/2009  . GERD 08/16/2009  . OSTEOARTHRITIS 08/16/2009  . Umbilical hernia without obstruction and without gangrene 03/28/2008  . Essential hypertension 10/20/2006  . Coronary atherosclerosis 10/20/2006  . CARDIAC ARRHYTHMIA 10/20/2006  . SEPTIC ARTHRITIS 10/20/2006    No past surgical history on file.  Prior to Admission medications   Medication Sig Start Date End Date Taking? Authorizing Provider  amLODipine (NORVASC) 5 MG tablet TAKE 1 TABLET EVERY EVENING 07/03/20   Lelon Perla, MD  aspirin 81 MG tablet Take 81 mg by mouth daily.      [provider]  Cholecalciferol (VITAMIN D3) 2000 units TABS Take 1 tablet by mouth daily.    [provider]  co-enzyme Q-10 30 MG capsule Take 30 mg by mouth daily.    [provider]  dorzolamide (TRUSOPT) 2 % ophthalmic solution Place 1 drop 2 (two) times daily into both eyes.  12/21/13   [provider]  doxazosin (CARDURA) 2 MG tablet TAKE 1 TABLET EVERY DAY 07/02/20   Jerrol Banana., MD  fluticasone [facility]) 50 MCG/ACT nasal spray USE 2 SPRAYS IN [facility] NOSTRIL EVERY DAY 05/16/20   Jerrol Banana., MD  ibuprofen (ADVIL,MOTRIN) 200 MG tablet Take 200 mg by mouth daily as needed for moderate pain.    [provider]  labetalol (NORMODYNE) 200 MG tablet TAKE 1 TABLET TWICE DAILY 07/02/20   Jerrol Banana., MD  latanoprost (XALATAN) 0.005 % ophthalmic solution 1  drop at bedtime.      [provider]  loratadine (CLARITIN) 10 MG tablet Take 1 tablet (10 mg total) by mouth daily. 04/29/16   Jerrol Banana., MD  LORazepam (ATIVAN) 0.5 MG tablet Take 1 tablet (0.5 mg total) by mouth at bedtime as needed. for sleep 04/26/20   Jerrol Banana., MD  losartan-hydrochlorothiazide [facility]) 100-25 MG tablet Take 1 tablet by mouth daily. 02/03/20   Jerrol Banana., MD    meclizine (ANTIVERT) 25 MG tablet TAKE 1 TABLET DAILY AS NEEDED FOR DIZZINESS. 03/29/20   Jerrol Banana., MD  mometasone (ELOCON) 0.1 % cream APPLY TO ITCHY EARS TWICE DAILY FOR TWELVE DAYS. MAY REPEAT AS NEEDED FOR ITCHY EARS. 03/29/20   Jerrol Banana., MD  Omega-3 Fatty Acids (FISH OIL) 1000 MG CAPS Take by mouth daily.    [provider]  omeprazole (PRILOSEC) 20 MG capsule Take 1 capsule (20 mg total) by mouth 2 (two) times daily before a meal. 05/16/20   Jerrol Banana., MD  pantoprazole (PROTONIX) 20 MG tablet TAKE 1 TABLET EVERY DAY 07/03/20   Jerrol Banana., MD  potassium chloride SA (KLOR-CON) 20 MEQ tablet TAKE 1 TABLET EVERY DAY 05/31/20   Lelon Perla, MD  rosuvastatin (CRESTOR) 40 MG tablet TAKE 1 TABLET EVERY DAY 01/12/20   Lelon Perla, MD    Allergies Patient has no known allergies.  Family History  Problem Relation Age of Onset  . Parkinson's disease Mother   . Heart disease Mother   . Dementia Mother   . Cancer Father        lung cancer, smoker  . Depression Sister   . Fibromyalgia Sister   . Arthritis Brother        B/L knee replacement  . Sleep apnea Brother     Social History Social History   Tobacco Use  . Smoking status: Never Smoker  . Smokeless tobacco: Never Used  Vaping Use  . Vaping Use: Never used  Substance Use Topics  . Alcohol use: Not Currently    Comment: drinks a beer occasionally  . Drug use: No    Review of Systems  Constitutional: Negative for fever. Eyes: Negative for visual changes. ENT: Negative for sore throat. Neck: No neck pain  Cardiovascular: + chest pain. Respiratory: Negative for shortness of breath. Gastrointestinal: Negative for abdominal pain, vomiting or diarrhea. Genitourinary: Negative for dysuria. Musculoskeletal: Negative for back pain. Skin: Negative for rash. Neurological: Negative for headaches, weakness or numbness. Psych: No SI or  HI  ____________________________________________   PHYSICAL EXAM:  VITAL SIGNS: ED Triage Vitals  Enc Vitals Group     BP 08/07/20 0137 (!) 156/74     Pulse Rate 08/07/20 0137 69     Resp 08/07/20 0137 20     Temp 08/07/20 0137 97.6 F (36.4 C)     Temp Source 08/07/20 0137 Oral     SpO2 08/07/20 0137 95 %     Weight 08/07/20 0137 211 lb (95.7 kg)     Height 08/07/20 0137 5\' 8"  (1.727 m)     Head Circumference --      Peak Flow --      Pain Score 08/07/20 0143 7     Pain Loc --      Pain Edu? --      Excl. in [place]? --     Constitutional: Alert and oriented. Well appearing and in no  apparent distress. HEENT:      Head: Normocephalic and atraumatic.         Eyes: Conjunctivae are normal. Sclera is non-icteric.       Mouth/Throat: Mucous membranes are moist.       Neck: Supple with no signs of meningismus. Cardiovascular: Regular rate and rhythm. No murmurs, gallops, or rubs. 2+ symmetrical distal pulses are present in all extremities. No JVD Respiratory: Normal respiratory effort. Lungs are clear to auscultation bilaterally.  Gastrointestinal: Soft, non tender. Musculoskeletal:  No edema, cyanosis, or erythema of extremities. Neurologic: Normal speech and language. Face is symmetric. Moving all extremities. No gross focal neurologic deficits are appreciated. Skin: Skin is warm, dry and intact. No rash noted. Psychiatric: Mood and affect are normal. Speech and behavior are normal.  ____________________________________________   LABS (all labs ordered are listed, but only abnormal results are displayed)  Labs Reviewed  COMPREHENSIVE METABOLIC PANEL - Abnormal; Notable for the following components:      Result Value   Glucose, Bld 164 (*)    Creatinine, Ser 1.32 (*)    GFR, Estimated 53 (*)    All other components within normal limits  CBC WITH DIFFERENTIAL/PLATELET  TROPONIN I (HIGH SENSITIVITY)  TROPONIN I (HIGH SENSITIVITY)    ____________________________________________  EKG  ED ECG REPORT I, Rudene Re, the attending physician, personally viewed and interpreted this ECG.  Normal sinus rhythm, rate of 69, normal intervals, normal axis, no ST elevations or depressions.  Normal EKG.  No significant changes when compared to prior from January 2021. ____________________________________________  RADIOLOGY  I have personally reviewed the images performed during this visit and I agree with the Radiologist's read.   Interpretation by Radiologist:  DG Chest 2 View  Result Date: 08/07/2020 CLINICAL DATA:  Awoke with chest pain. EXAM: CHEST - 2 VIEW COMPARISON:  Most recent radiograph 07/21/2016. FINDINGS: Post median sternotomy and CABG. Stable upper normal heart size. Aortic atherosclerosis. There is no pulmonary edema. Mild bibasilar atelectasis. No significant pleural effusion. No pneumothorax. Confluent consolidation. No acute osseous abnormalities are seen. IMPRESSION: 1. Mild bibasilar atelectasis. 2. Post CABG.  Aortic Atherosclerosis (ICD10-I70.0). Electronically Signed   By: Keith Rake M.D.   On: 08/07/2020 02:00     ____________________________________________   PROCEDURES  Procedure(s) performed:yes .1-3 Lead EKG Interpretation Performed by: Rudene Re, MD Authorized by: Rudene Re, MD     Interpretation: normal     ECG rate assessment: normal     Rhythm: sinus rhythm     Ectopy: none     Critical Care performed:  None ____________________________________________   INITIAL IMPRESSION / ASSESSMENT AND PLAN / ED COURSE  86 y.o. male with a history of CAD status post CABG in 1997, atrial flutter status post ablation, hypertension, anemia who presents for evaluation of chest pain.  Patient describes a sharp pain located in the center of his chest that had lasted up to 40 minutes and resolved prior to arrival.  Patient has no pain at this time.  Has been in the  waiting room for 3 hours.  Initial EKG was normal.  First high-sensitivity troponin negative.  Ddx ACS vs PTX vs pericarditis vs myocarditis vs MSK vs Gerd.  Low suspicion for PE with fully resolved pain, no tachypnea, tachycardia, hypoxia.  Low suspicion for dissection with lack of severely elevated blood pressure, no neuro deficits, normal mediastinum silhouette in chest x-ray, no radiation of the pain to the back.  Patient has already taken an aspirin at home.  We will get a second troponin and if that is negative plan to discharge home with close follow-up with cardiology since patient has not had a stress test or catheterization since his CABG.  Patient's most recent cardiology note from January 2021 reviewed.  _________________________ 5:06 AM on 08/07/2020 -----------------------------------------  Delta troponin negative.  Patient remains pain-free.  Discussed again close follow-up with cardiology and recommended return to the emergency room if patient has any new episodes of chest pain.    _____________________________________________ Please note:  Patient was evaluated in Emergency Department today for the symptoms described in the history of present illness. Patient was evaluated in the context of the global COVID-19 pandemic, which necessitated consideration that the patient might be at risk for infection with the SARS-CoV-2 virus that causes COVID-19. Institutional protocols and algorithms that pertain to the evaluation of patients at risk for COVID-19 are in a state of rapid change based on information released by regulatory bodies including the CDC and federal and state organizations. These policies and algorithms were followed during the patient's care in the ED.  Some ED evaluations and interventions may be delayed as a result of limited staffing during the pandemic.   [place] Controlled Substance Database was reviewed by me. ____________________________________________   FINAL  CLINICAL IMPRESSION(S) / ED DIAGNOSES   Final diagnoses:  Chest pain, unspecified type      NEW MEDICATIONS STARTED DURING THIS VISIT:  ED Discharge Orders    None       Note:  This document was prepared using Dragon voice recognition software and may include unintentional dictation errors.    Alfred Levins, [place], MD 08/07/20 [phone]

## 2020-08-07 NOTE — ED Triage Notes (Signed)
Pt triage via wheelchair.  Pt reports he woke up with chest pain tonight.  No sob.  No n/v/.   States pain in center of chest   Pt alert

## 2020-08-08 NOTE — Progress Notes (Signed)
HPI: FU coronary artery disease status post coronary bypassing graft in May 1997. He also has a history of atrial flutter ablation. Previous abdominal ultrasound in May 2006 showed no significant renal artery stenosis and normal caliber aorta. Nuclear study 5/16 showed inferior ischemia and EF 61; we are treating medically.  Patient seen in the emergency room October 2021 with chest pain.  Troponins were normal.  Since I last saw him,  he states his episode of chest pain occurred in the early a.m. hours. It was sharp and substernal without radiation or associated symptoms. Lasted 5 to 10 minutes and resolve spontaneously. It was not like his previous cardiac pain. He otherwise denies increased dyspnea on exertion. He has not had exertional chest pain or syncope.  Current Outpatient Medications  Medication Sig Dispense Refill   amLODipine (NORVASC) 5 MG tablet TAKE 1 TABLET EVERY EVENING 90 tablet 3   aspirin 81 MG tablet Take 81 mg by mouth daily.       Cholecalciferol (VITAMIN D3) 2000 units TABS Take 1 tablet by mouth daily.     co-enzyme Q-10 30 MG capsule Take 30 mg by mouth daily.     dorzolamide (TRUSOPT) 2 % ophthalmic solution Place 1 drop 2 (two) times daily into both eyes.      doxazosin (CARDURA) 2 MG tablet TAKE 1 TABLET EVERY DAY 90 tablet 1   fluticasone (FLONASE) 50 MCG/ACT nasal spray USE 2 SPRAYS IN EACH NOSTRIL EVERY DAY 48 g 1   ibuprofen (ADVIL,MOTRIN) 200 MG tablet Take 200 mg by mouth daily as needed for moderate pain.     labetalol (NORMODYNE) 200 MG tablet TAKE 1 TABLET TWICE DAILY 180 tablet 1   latanoprost (XALATAN) 0.005 % ophthalmic solution 1 drop at bedtime.       loratadine (CLARITIN) 10 MG tablet Take 1 tablet (10 mg total) by mouth daily. 30 tablet 11   LORazepam (ATIVAN) 0.5 MG tablet Take 1 tablet (0.5 mg total) by mouth at bedtime as needed. for sleep 90 tablet 1   losartan-hydrochlorothiazide (HYZAAR) 100-25 MG tablet Take 1 tablet by mouth  daily. 90 tablet 3   meclizine (ANTIVERT) 25 MG tablet TAKE 1 TABLET DAILY AS NEEDED FOR DIZZINESS. 90 tablet 3   mometasone (ELOCON) 0.1 % cream APPLY TO ITCHY EARS TWICE DAILY FOR TWELVE DAYS. MAY REPEAT AS NEEDED FOR ITCHY EARS. 45 g 2   Omega-3 Fatty Acids (FISH OIL) 1000 MG CAPS Take by mouth daily.     omeprazole (PRILOSEC) 20 MG capsule Take 1 capsule (20 mg total) by mouth 2 (two) times daily before a meal. 180 capsule 3   pantoprazole (PROTONIX) 20 MG tablet TAKE 1 TABLET EVERY DAY 90 tablet 1   potassium chloride SA (KLOR-CON) 20 MEQ tablet TAKE 1 TABLET EVERY DAY 90 tablet 3   rosuvastatin (CRESTOR) 40 MG tablet TAKE 1 TABLET EVERY DAY 90 tablet 3   No current facility-administered medications for this visit.     Past Medical History:  Diagnosis Date   Anemia, unspecified    Anxiety state, unspecified    Cancer ([place])    skin   Coronary atherosclerosis    GERD (gastroesophageal reflux disease)    Osteoarthritis    group B streptococcal septic arthritis   Unspecified essential hypertension     History reviewed. No pertinent surgical history.  Social History   Socioeconomic History   Marital status: Divorced    Spouse name: Not on file  Number of children: 3   Years of education: Not on file   Highest education level: Some college, no degree  Occupational History   Occupation: retired  Tobacco Use   Smoking status: Never Smoker   Smokeless tobacco: Never Used  Scientific laboratory technician Use: Never used  Substance and Sexual Activity   Alcohol use: Not Currently    Comment: drinks a beer occasionally   Drug use: No   Sexual activity: Not on file  Other Topics Concern   Not on file  Social History Narrative   Not on file   Social Determinants of Health   Financial Resource Strain:    Difficulty of Paying Living Expenses: Not on file  Food Insecurity:    Worried About Charity fundraiser in the Last Year: Not on file   [employer] of  Food in the Last Year: Not on file  Transportation Needs:    Lack of Transportation (Medical): Not on file   Lack of Transportation (Non-Medical): Not on file  Physical Activity:    Days of Exercise per Week: Not on file   Minutes of Exercise per Session: Not on file  Stress:    Feeling of Stress : Not on file  Social Connections:    Frequency of Communication with Friends and Family: Not on file   Frequency of Social Gatherings with Friends and Family: Not on file   Attends Religious Services: Not on file   Active Member of Clubs or Organizations: Not on file   Attends Archivist Meetings: Not on file   Marital Status: Not on file  Intimate Partner Violence:    Fear of Current or Ex-Partner: Not on file   Emotionally Abused: Not on file   Physically Abused: Not on file   Sexually Abused: Not on file    Family History  Problem Relation Age of Onset   Parkinson's disease Mother    Heart disease Mother    Dementia Mother    Cancer Father        lung cancer, smoker   Depression Sister    Fibromyalgia Sister    Arthritis Brother        B/L knee replacement   Sleep apnea Brother     ROS: no fevers or chills, productive cough, hemoptysis, dysphasia, odynophagia, melena, hematochezia, dysuria, hematuria, rash, seizure activity, orthopnea, PND, pedal edema, claudication. Remaining systems are negative.  Physical Exam: Well-developed well-nourished in no acute distress.  Skin is warm and dry.  HEENT is normal.  Neck is supple.  Chest is clear to auscultation with normal expansion.  Cardiovascular exam is regular rate and rhythm.  Abdominal exam nontender or distended. No masses palpated. Extremities show no edema. neuro grossly intact  ECG-August 07, 2020-sinus rhythm, nonspecific ST changes.  Personally reviewed  A/P  1 coronary artery disease status post coronary artery bypass graft-plan to continue medical therapy with aspirin and  statin.  2 hypertension-patient's blood pressure is controlled.  Continue present medications and follow.  3 chest pain-recently seen in the emergency room with chest pain.  Troponins were normal. His symptoms were atypical. They were not like his previous cardiac pain. He does not have exertional symptoms. We will follow for now. If he has recurrences in the future we will consider further evaluation such as stress nuclear study.  4 hyperlipidemia-continue statin.  Kirk Ruths, MD

## 2020-08-14 ENCOUNTER — Encounter: Payer: Self-pay | Admitting: Cardiology

## 2020-08-14 ENCOUNTER — Other Ambulatory Visit: Payer: Self-pay

## 2020-08-14 ENCOUNTER — Ambulatory Visit: Payer: Medicare HMO | Admitting: Cardiology

## 2020-08-14 VITALS — BP 137/69 | HR 72 | Temp 98.4°F | Ht 68.0 in | Wt 214.4 lb

## 2020-08-14 DIAGNOSIS — I2510 Atherosclerotic heart disease of native coronary artery without angina pectoris: Secondary | ICD-10-CM | POA: Diagnosis not present

## 2020-08-14 DIAGNOSIS — I10 Essential (primary) hypertension: Secondary | ICD-10-CM | POA: Diagnosis not present

## 2020-08-14 DIAGNOSIS — E7800 Pure hypercholesterolemia, unspecified: Secondary | ICD-10-CM

## 2020-08-14 NOTE — Patient Instructions (Signed)

## 2020-09-14 NOTE — Progress Notes (Signed)
I,[name],acting as a scribe for Richard Durie, MD.,have documented all relevant documentation on the behalf of Richard Durie, MD,as directed by  Richard Durie, MD while in the presence of Richard Durie, MD.   Established patient visit   Patient: [name]   DOB: Jun 17, 1933   86 y.o. Male  MRN: [id number] Visit Date: 09/17/2020  Today's healthcare provider: [name], MD   Chief Complaint  Patient presents with  . Ear Problem   Subjective    HPI  Patient states he has a pulsating noise in his left ear. Patient states he has been hearing this noise for about 2 weeks. No pain or discomfort. It started a week ago and it feels like a swishing sound in his left ear.  No dizziness.  No definite tinnitus. No pain and no other systemic symptoms.  Medications: Outpatient Medications Prior to Visit  Medication Sig  . amLODipine (NORVASC) 5 MG tablet TAKE 1 TABLET EVERY EVENING  . aspirin 81 MG tablet Take 81 mg by mouth daily.    . Cholecalciferol (VITAMIN D3) 2000 units TABS Take 1 tablet by mouth daily.  Marland Kitchen co-enzyme Q-10 30 MG capsule Take 30 mg by mouth daily.  . dorzolamide (TRUSOPT) 2 % ophthalmic solution Place 1 drop 2 (two) times daily into both eyes.   Marland Kitchen doxazosin (CARDURA) 2 MG tablet TAKE 1 TABLET EVERY DAY  . fluticasone (FLONASE) 50 MCG/ACT nasal spray USE 2 SPRAYS IN EACH NOSTRIL EVERY DAY  . ibuprofen (ADVIL,MOTRIN) 200 MG tablet Take 200 mg by mouth daily as needed for moderate pain.  Marland Kitchen labetalol (NORMODYNE) 200 MG tablet TAKE 1 TABLET TWICE DAILY  . latanoprost (XALATAN) 0.005 % ophthalmic solution 1 drop at bedtime.    Marland Kitchen loratadine (CLARITIN) 10 MG tablet Take 1 tablet (10 mg total) by mouth daily.  Marland Kitchen LORazepam (ATIVAN) 0.5 MG tablet Take 1 tablet (0.5 mg total) by mouth at bedtime as needed. for sleep  . losartan-hydrochlorothiazide (HYZAAR) 100-25 MG tablet Take 1 tablet by mouth daily.  . meclizine (ANTIVERT) 25 MG tablet TAKE 1  TABLET DAILY AS NEEDED FOR DIZZINESS.  . mometasone (ELOCON) 0.1 % cream APPLY TO ITCHY EARS TWICE DAILY FOR TWELVE DAYS. MAY REPEAT AS NEEDED FOR ITCHY EARS.  Marland Kitchen Omega-3 Fatty Acids (FISH OIL) 1000 MG CAPS Take by mouth daily.  Marland Kitchen omeprazole (PRILOSEC) 20 MG capsule Take 1 capsule (20 mg total) by mouth 2 (two) times daily before a meal.  . pantoprazole (PROTONIX) 20 MG tablet TAKE 1 TABLET EVERY DAY  . potassium chloride SA (KLOR-CON) 20 MEQ tablet TAKE 1 TABLET EVERY DAY  . rosuvastatin (CRESTOR) 40 MG tablet TAKE 1 TABLET EVERY DAY   No facility-administered medications prior to visit.    Review of Systems  Constitutional: Negative for appetite change, chills and fever.  Respiratory: Negative for chest tightness, shortness of breath and wheezing.   Cardiovascular: Negative for chest pain and palpitations.  Gastrointestinal: Negative for abdominal pain, nausea and vomiting.       Objective    BP 139/69 (BP Location: Right Arm, Patient Position: Sitting, Cuff Size: Large)   Pulse 66   Temp 98.7 F (37.1 C) (Oral)   Resp 18   Wt 224 lb (101.6 kg)   SpO2 95%   BMI 34.06 kg/m     Physical Exam Vitals reviewed.  HENT:     Head: Normocephalic and atraumatic.     Right Ear: Tympanic membrane,  ear canal and external ear normal.     Left Ear: Tympanic membrane, ear canal and external ear normal.     Nose: Nose normal.  Eyes:     Conjunctiva/sclera: Conjunctivae normal.  Cardiovascular:     Rate and Rhythm: Normal rate and regular rhythm.     Heart sounds: Normal heart sounds.  Pulmonary:     Effort: Pulmonary effort is normal.     Breath sounds: Normal breath sounds.  Abdominal:     Palpations: Abdomen is soft.  Skin:    General: Skin is warm and dry.  Neurological:     General: No focal deficit present.     Mental Status: He is alert and oriented to person, place, and time.  Psychiatric:        Mood and Affect: Mood normal.        Behavior: Behavior normal.         Thought Content: Thought content normal.        Judgment: Judgment normal.       No results found for any visits on 09/17/20.  Assessment & Plan     1. Hearing loss of left ear, unspecified hearing loss type After discussion with Dr. Tami Ribas he will see him this afternoon at the ENT office.  2. Atherosclerosis of coronary artery bypass graft of native heart without angina pectoris All risk factors treated  3. Essential hypertension Good control.  4. Pure hypercholesterolemia LDL goal of less than 70 on statin.   No follow-ups on file.         Regis Wiland Cranford Mon, MD  [facility] [phone] (phone) [phone] (fax)  [place]

## 2020-09-17 ENCOUNTER — Encounter: Payer: Self-pay | Admitting: Family Medicine

## 2020-09-17 ENCOUNTER — Other Ambulatory Visit: Payer: Self-pay

## 2020-09-17 ENCOUNTER — Ambulatory Visit (INDEPENDENT_AMBULATORY_CARE_PROVIDER_SITE_OTHER): Payer: Medicare HMO | Admitting: Family Medicine

## 2020-09-17 VITALS — BP 139/69 | HR 66 | Temp 98.7°F | Resp 18 | Wt 224.0 lb

## 2020-09-17 DIAGNOSIS — I25810 Atherosclerosis of coronary artery bypass graft(s) without angina pectoris: Secondary | ICD-10-CM

## 2020-09-17 DIAGNOSIS — H903 Sensorineural hearing loss, bilateral: Secondary | ICD-10-CM | POA: Diagnosis not present

## 2020-09-17 DIAGNOSIS — E7800 Pure hypercholesterolemia, unspecified: Secondary | ICD-10-CM

## 2020-09-17 DIAGNOSIS — I10 Essential (primary) hypertension: Secondary | ICD-10-CM

## 2020-09-17 DIAGNOSIS — H93A2 Pulsatile tinnitus, left ear: Secondary | ICD-10-CM | POA: Diagnosis not present

## 2020-09-17 DIAGNOSIS — H9192 Unspecified hearing loss, left ear: Secondary | ICD-10-CM | POA: Diagnosis not present

## 2020-09-17 DIAGNOSIS — H6122 Impacted cerumen, left ear: Secondary | ICD-10-CM | POA: Diagnosis not present

## 2020-09-19 ENCOUNTER — Other Ambulatory Visit: Payer: Self-pay | Admitting: Unknown Physician Specialty

## 2020-09-19 DIAGNOSIS — H93A2 Pulsatile tinnitus, left ear: Secondary | ICD-10-CM

## 2020-09-21 ENCOUNTER — Other Ambulatory Visit: Payer: Medicare HMO

## 2020-09-21 DIAGNOSIS — Z20822 Contact with and (suspected) exposure to covid-19: Secondary | ICD-10-CM | POA: Diagnosis not present

## 2020-09-22 LAB — NOVEL CORONAVIRUS, NAA: SARS-CoV-2, NAA: NOT DETECTED

## 2020-09-22 LAB — SARS-COV-2, NAA 2 DAY TAT

## 2020-09-24 ENCOUNTER — Telehealth: Payer: Self-pay

## 2020-09-24 NOTE — Telephone Encounter (Signed)

## 2020-09-27 DIAGNOSIS — H40153 Residual stage of open-angle glaucoma, bilateral: Secondary | ICD-10-CM | POA: Diagnosis not present

## 2020-09-28 ENCOUNTER — Other Ambulatory Visit: Payer: Self-pay

## 2020-09-28 ENCOUNTER — Ambulatory Visit
Admission: RE | Admit: 2020-09-28 | Discharge: 2020-09-28 | Disposition: A | Discharge status: Home / Self Care | Payer: Medicare HMO | Source: Ambulatory Visit | Attending: Unknown Physician Specialty | Admitting: Unknown Physician Specialty

## 2020-09-28 DIAGNOSIS — E785 Hyperlipidemia, unspecified: Secondary | ICD-10-CM | POA: Diagnosis not present

## 2020-09-28 DIAGNOSIS — I6522 Occlusion and stenosis of left carotid artery: Secondary | ICD-10-CM | POA: Diagnosis not present

## 2020-09-28 DIAGNOSIS — H93A2 Pulsatile tinnitus, left ear: Secondary | ICD-10-CM

## 2020-09-28 DIAGNOSIS — I771 Stricture of artery: Secondary | ICD-10-CM | POA: Diagnosis not present

## 2020-10-03 ENCOUNTER — Other Ambulatory Visit: Payer: Self-pay | Admitting: *Deleted

## 2020-10-03 ENCOUNTER — Other Ambulatory Visit: Payer: Self-pay | Admitting: Family Medicine

## 2020-10-03 DIAGNOSIS — R42 Dizziness and giddiness: Secondary | ICD-10-CM

## 2020-10-03 DIAGNOSIS — G4700 Insomnia, unspecified: Secondary | ICD-10-CM

## 2020-10-04 MED ORDER — LORAZEPAM 0.5 MG PO TABS: 0.5000 mg | ORAL_TABLET | Freq: Every evening | ORAL | 1 refills | Status: DC | PRN

## 2020-10-22 ENCOUNTER — Other Ambulatory Visit: Payer: Self-pay | Admitting: Cardiology

## 2020-10-22 DIAGNOSIS — I25810 Atherosclerosis of coronary artery bypass graft(s) without angina pectoris: Secondary | ICD-10-CM

## 2020-10-25 DIAGNOSIS — I671 Cerebral aneurysm, nonruptured: Secondary | ICD-10-CM | POA: Diagnosis not present

## 2020-10-26 ENCOUNTER — Other Ambulatory Visit: Payer: Self-pay | Admitting: Neurosurgery

## 2020-10-26 DIAGNOSIS — I671 Cerebral aneurysm, nonruptured: Secondary | ICD-10-CM

## 2020-11-01 ENCOUNTER — Other Ambulatory Visit: Payer: Self-pay

## 2020-11-01 ENCOUNTER — Encounter: Payer: Self-pay | Admitting: Family Medicine

## 2020-11-01 ENCOUNTER — Ambulatory Visit (INDEPENDENT_AMBULATORY_CARE_PROVIDER_SITE_OTHER): Payer: Medicare HMO | Admitting: Family Medicine

## 2020-11-01 VITALS — BP 130/76 | HR 72 | Temp 98.1°F | Resp 16 | Ht 68.0 in | Wt 212.0 lb

## 2020-11-01 DIAGNOSIS — Z0000 Encounter for general adult medical examination without abnormal findings: Secondary | ICD-10-CM

## 2020-11-01 DIAGNOSIS — I10 Essential (primary) hypertension: Secondary | ICD-10-CM

## 2020-11-01 DIAGNOSIS — I2510 Atherosclerotic heart disease of native coronary artery without angina pectoris: Secondary | ICD-10-CM

## 2020-11-01 DIAGNOSIS — E7800 Pure hypercholesterolemia, unspecified: Secondary | ICD-10-CM

## 2020-11-01 DIAGNOSIS — I25810 Atherosclerosis of coronary artery bypass graft(s) without angina pectoris: Secondary | ICD-10-CM | POA: Diagnosis not present

## 2020-11-01 DIAGNOSIS — Z1329 Encounter for screening for other suspected endocrine disorder: Secondary | ICD-10-CM

## 2020-11-01 DIAGNOSIS — M1990 Unspecified osteoarthritis, unspecified site: Secondary | ICD-10-CM

## 2020-11-01 NOTE — Progress Notes (Signed)
Annual Wellness Visit     Patient: [name], Male    DOB: [birth year], 87 y.o.   MRN: [id number] Visit Date: 11/01/2020  Today's Provider: [name], MD   Chief Complaint  Patient presents with  . Medicare Wellness   Subjective    [name] is a 87 y.o. male who presents today for his Annual Wellness Visit. He reports consuming a general diet. Home exercise routine includes walking 1 hrs per week. He generally feels well. He reports sleeping well. He does not have additional problems to discuss today.   HPI 05/16/2020 IFOBT-Negative 08/21/2011 Colonoscopy-Diverticulosis, internal hemorrhoids  Patient Active Problem List   Diagnosis Date Noted  . Arthropathy of hand 06/11/2015  . Arteriosclerosis of coronary artery 06/11/2015  . Impotence of organic origin 06/11/2015  . Generalized anxiety disorder 06/11/2015  . Acid reflux 06/11/2015  . History of other malignant neoplasm of skin 06/11/2015  . HLD (hyperlipidemia) 06/11/2015  . Arthritis, degenerative 06/11/2015  . Adiposity 06/11/2015  . Venous insufficiency of leg 06/11/2015  . HYPERLIPIDEMIA 08/16/2009  . ANEMIA 08/16/2009  . ANXIETY 08/16/2009  . GERD 08/16/2009  . OSTEOARTHRITIS 08/16/2009  . Umbilical hernia without obstruction and without gangrene 03/28/2008  . Essential hypertension 10/20/2006  . Coronary atherosclerosis 10/20/2006  . CARDIAC ARRHYTHMIA 10/20/2006  . SEPTIC ARTHRITIS 10/20/2006   History reviewed. No pertinent surgical history. Social History   Socioeconomic History  . Marital status: Divorced    Spouse name: Not on file  . Number of children: 3  . Years of education: Not on file  . Highest education level: Some college, no degree  Occupational History  . Occupation: retired  Tobacco Use  . Smoking status: Never Smoker  . Smokeless tobacco: Never Used  Vaping Use  . Vaping Use: Never used  Substance and Sexual Activity  . Alcohol use: Not Currently    Comment:  drinks a beer occasionally  . Drug use: No  . Sexual activity: Not on file  Other Topics Concern  . Not on file  Social History Narrative  . Not on file   Social Determinants of Health   Financial Resource Strain: Not on file  Food Insecurity: Not on file  Transportation Needs: Not on file  Physical Activity: Not on file  Stress: Not on file  Social Connections: Not on file  Intimate Partner Violence: Not on file   Family History  Problem Relation Age of Onset  . Parkinson's disease Mother   . Heart disease Mother   . Dementia Mother   . Cancer Father        lung cancer, smoker  . Depression Sister   . Fibromyalgia Sister   . Arthritis Brother        B/L knee replacement  . Sleep apnea Brother    No Known Allergies     Medications: Outpatient Medications Prior to Visit  Medication Sig  . amLODipine (NORVASC) 5 MG tablet TAKE 1 TABLET EVERY EVENING  . aspirin 81 MG tablet Take 81 mg by mouth daily.  . Cholecalciferol (VITAMIN D3) 2000 units TABS Take 1 tablet by mouth daily.  Marland Kitchen co-enzyme Q-10 30 MG capsule Take 30 mg by mouth daily.  . dorzolamide (TRUSOPT) 2 % ophthalmic solution Place 1 drop 2 (two) times daily into both eyes.   Marland Kitchen doxazosin (CARDURA) 2 MG tablet TAKE 1 TABLET EVERY DAY  . fluticasone (FLONASE) 50 MCG/ACT nasal spray USE 2 SPRAYS IN EACH NOSTRIL EVERY  DAY  . ibuprofen (ADVIL,MOTRIN) 200 MG tablet Take 200 mg by mouth daily as needed for moderate pain.  Marland Kitchen labetalol (NORMODYNE) 200 MG tablet TAKE 1 TABLET TWICE DAILY  . latanoprost (XALATAN) 0.005 % ophthalmic solution 1 drop at bedtime.  Marland Kitchen loratadine (CLARITIN) 10 MG tablet Take 1 tablet (10 mg total) by mouth daily.  Marland Kitchen LORazepam (ATIVAN) 0.5 MG tablet Take 1 tablet (0.5 mg total) by mouth at bedtime as needed. for sleep  . losartan-hydrochlorothiazide (HYZAAR) 100-25 MG tablet Take 1 tablet by mouth daily.  . meclizine (ANTIVERT) 25 MG tablet TAKE 1 TABLET DAILY AS NEEDED FOR DIZZINESS.  .  mometasone (ELOCON) 0.1 % cream APPLY TO ITCHY EARS TWICE DAILY FOR TWELVE DAYS. MAY REPEAT AS NEEDED FOR ITCHY EARS.  Marland Kitchen Omega-3 Fatty Acids (FISH OIL) 1000 MG CAPS Take by mouth daily.  . pantoprazole (PROTONIX) 20 MG tablet TAKE 1 TABLET EVERY DAY  . potassium chloride SA (KLOR-CON) 20 MEQ tablet TAKE 1 TABLET EVERY DAY  . rosuvastatin (CRESTOR) 40 MG tablet TAKE 1 TABLET EVERY DAY  . [DISCONTINUED] omeprazole (PRILOSEC) 20 MG capsule Take 1 capsule (20 mg total) by mouth 2 (two) times daily before a meal. (Patient not taking: Reported on 11/01/2020)   No facility-administered medications prior to visit.    No Known Allergies  Patient Care Team: Jerrol Banana., MD as PCP - General (Unknown Physician Specialty) Stanford Breed Denice Bors, MD (Cardiology) Lorelee Cover., MD as Consulting Physician (Ophthalmology) Beverly Gust, MD (Otolaryngology) Dasher, Rayvon Char, MD (Dermatology)  Review of Systems  Constitutional: Negative.   HENT: Positive for hearing loss and sinus pressure.   Eyes: Positive for itching.  Respiratory: Negative.   Cardiovascular: Negative.   Gastrointestinal: Negative.   Endocrine: Negative.   Genitourinary: Negative.   Musculoskeletal: Positive for arthralgias and back pain.  Skin: Negative.   Allergic/Immunologic: Negative.   Neurological: Positive for dizziness.  Hematological: Negative.   Psychiatric/Behavioral: Positive for agitation.  All other systems reviewed and are negative.   Last CBC Lab Results  Component Value Date   WBC 4.9 08/07/2020   HGB 13.8 08/07/2020   HCT 40.5 08/07/2020   MCV 92.0 08/07/2020   MCH 31.4 08/07/2020   RDW 14.0 08/07/2020   PLT 204 [id number]   Last metabolic panel Lab Results  Component Value Date   GLUCOSE 164 (H) 08/07/2020   NA 137 08/07/2020   K 3.6 08/07/2020   CL 103 08/07/2020   CO2 23 08/07/2020   BUN 21 08/07/2020   CREATININE 1.32 (H) 08/07/2020   GFRNONAA 53 (L) 08/07/2020   GFRAA 54 (L)  09/29/2019   CALCIUM 9.0 08/07/2020   PROT 7.2 08/07/2020   ALBUMIN 4.1 08/07/2020   LABGLOB 2.2 09/29/2019   AGRATIO 2.0 09/29/2019   BILITOT 0.7 08/07/2020   ALKPHOS 65 08/07/2020   AST 23 08/07/2020   ALT 32 08/07/2020   ANIONGAP 11 08/07/2020   Last lipids Lab Results  Component Value Date   CHOL 86 (L) 09/29/2019   HDL 36 (L) 09/29/2019   LDLCALC 32 09/29/2019   TRIG 94 09/29/2019   CHOLHDL 2.4 09/29/2019   Last hemoglobin A1c Lab Results  Component Value Date   HGBA1C 6.0 (H) 08/07/2016   Last thyroid functions Lab Results  Component Value Date   TSH 1.840 09/29/2019   Last vitamin D Lab Results  Component Value Date   VD25OH 28.5 (L) 06/02/2016   Last vitamin B12 and Folate Lab Results  Component  Value Date   VITAMINB12 385 06/02/2016      Objective    Vitals: BP 130/76 (BP Location: Left Arm, Patient Position: Sitting, Cuff Size: Normal)   Pulse 72   Temp 98.1 F (36.7 C) (Oral)   Resp 16   Ht 5\' 8"  (1.727 m)   Wt 212 lb (96.2 kg)   SpO2 97%   BMI 32.23 kg/m  BP Readings from Last 3 Encounters:  11/01/20 130/76  09/17/20 139/69  08/14/20 137/69   Wt Readings from Last 3 Encounters:  11/01/20 212 lb (96.2 kg)  09/17/20 224 lb (101.6 kg)  08/14/20 214 lb 6.4 oz (97.3 kg)      Physical Exam Vitals reviewed.  Constitutional:      Appearance: He is well-developed.  HENT:     Head: Normocephalic and atraumatic.     Right Ear: External ear normal.     Left Ear: External ear normal.  Eyes:     General: No scleral icterus.    Conjunctiva/sclera: Conjunctivae normal.  Neck:     Thyroid: No thyromegaly.  Cardiovascular:     Rate and Rhythm: Normal rate and regular rhythm.     Heart sounds: Normal heart sounds.  Pulmonary:     Effort: Pulmonary effort is normal.     Breath sounds: Normal breath sounds.  Abdominal:     Palpations: Abdomen is soft.  Skin:    General: Skin is warm and dry.     Comments: Many SKs.  Neurological:      General: No focal deficit present.     Mental Status: He is alert and oriented to person, place, and time.  Psychiatric:        Mood and Affect: Mood normal.        Behavior: Behavior normal.        Thought Content: Thought content normal.        Judgment: Judgment normal.     6CIT Screen 11/01/2020 09/20/2019 09/15/2018 09/01/2017 08/06/2016  What Year? 0 points 0 points 0 points 0 points 0 points  What month? 0 points 0 points 0 points 0 points 0 points  What time? 0 points 0 points 0 points 0 points 0 points  Count back from 20 0 points 0 points 0 points 0 points 0 points  Months in reverse 0 points 0 points 0 points 0 points 0 points  Repeat phrase 0 points 0 points 0 points 2 points 0 points  Total Score 0 0 0 2 0   Most recent functional status assessment: In your present state of health, do you have any difficulty performing the following activities: 11/01/2020  Hearing? Y  Vision? N  Difficulty concentrating or making decisions? N  Walking or climbing stairs? N  Dressing or bathing? N  Doing errands, shopping? N  Some recent data might be hidden   Most recent fall risk assessment: Fall Risk  11/01/2020  Falls in the past year? 0  Number falls in past yr: 0  Comment -  Injury with Fall? 0  Risk for fall due to : No Fall Risks  Risk for fall due to: Comment -  Follow up Falls evaluation completed    Most recent depression screenings: PHQ 2/9 Scores 11/01/2020 09/17/2020  PHQ - 2 Score 1 1  PHQ- 9 Score 1 1   Most recent cognitive screening: 6CIT Screen 11/01/2020  What Year? 0 points  What month? 0 points  What time? 0 points  Count back from 20  0 points  Months in reverse 0 points  Repeat phrase 0 points  Total Score 0   Most recent Audit-C alcohol use screening Alcohol Use Disorder Test (AUDIT) 11/01/2020  1. How often do you have a drink containing alcohol? 1  2. How many drinks containing alcohol do you have on a typical day when you are drinking? 0  3. How  often do you have six or more drinks on one occasion? 0  AUDIT-C Score 1  Alcohol Brief Interventions/Follow-up AUDIT Score <7 follow-up not indicated   A score of 3 or more in women, and 4 or more in men indicates increased risk for alcohol abuse, EXCEPT if all of the points are from question 1   No results found for any visits on 11/01/20.  Assessment & Plan     Annual wellness visit done today including the all of the following: Reviewed patient's Family Medical History Reviewed and updated list of patient's medical providers Assessment of cognitive impairment was done Assessed patient's functional ability Established a written schedule for health screening [place] Completed and Reviewed  Exercise Activities and Dietary recommendations Goals    . DIET - INCREASE WATER INTAKE     Recommend increasing water intake to 6 glasses a day.     . Increase water intake     Starting 08/06/16, I will increase to 4 glasses of water a day.       Immunization History  Administered Date(s) Administered  . Influenza, High Dose Seasonal PF 08/09/2015, 07/14/2016, 07/23/2017, 09/08/2018, 07/24/2020  . Influenza,inj,Quad PF,6+ Mos 07/15/2019  . Influenza-Unspecified 07/13/2013  . Moderna Sars-Covid-2 Vaccination 11/10/2019, 12/07/2019  . Pneumococcal Conjugate-13 04/11/2014  . Pneumococcal Polysaccharide-23 08/19/2006  . Td 05/01/2005  . Tdap 06/12/2015  . Zoster 02/05/2011  . Zoster Recombinat (Shingrix) 06/04/2018, 08/10/2018    Health Maintenance  Topic Date Due  . COVID-19 Vaccine (3 - Booster for Moderna series) 06/05/2020  . TETANUS/TDAP  06/11/2025  . INFLUENZA VACCINE  Completed  . PNA vac Low Risk Adult  Completed   1. Encounter for Medicare annual wellness exam  - Lipid panel - TSH - CBC w/Diff/Platelet - Comprehensive Metabolic Panel (CMET)  2. Annual physical exam   3. Essential hypertension  - CBC w/Diff/Platelet - Comprehensive  Metabolic Panel (CMET)  4. Pure hypercholesterolemia  - Lipid panel  5. Thyroid disorder screening  - TSH  6. Arteriosclerosis of coronary artery All risk factors treated.  7. Atherosclerosis of coronary artery bypass graft of native heart without angina pectoris   8. Osteoarthritis, unspecified osteoarthritis type, unspecified site    Discussed health benefits of physical activity, and encouraged him to engage in regular exercise appropriate for his age and condition.      No follow-ups on file.     I, [name], MD, have reviewed all documentation for this visit. The documentation on 11/09/20 for the exam, diagnosis, procedures, and orders are all accurate and complete.    Kimani Hovis Cranford Mon, MD  [facility] [phone] (phone) [phone] (fax)  [place]

## 2020-11-01 NOTE — Patient Instructions (Signed)
Preventive Care 85 Years and Older, Male Preventive care refers to lifestyle choices and visits with your health care provider that can promote health and wellness. This includes:  A yearly physical exam. This is also called an annual wellness visit.  Regular dental and eye exams.  Immunizations.  Screening for certain conditions.  Healthy lifestyle choices, such as: ? Eating a healthy diet. ? Getting regular exercise. ? Not using drugs or products that contain nicotine and tobacco. ? Limiting alcohol use. What can I expect for my preventive care visit? Physical exam Your health care provider will check your:  Height and weight. These may be used to calculate your BMI (body mass index). BMI is a measurement that tells if you are at a healthy weight.  Heart rate and blood pressure.  Body temperature.  Skin for abnormal spots. Counseling Your health care provider may ask you questions about your:  Past medical problems.  Family's medical history.  Alcohol, tobacco, and drug use.  Emotional well-being.  Home life and relationship well-being.  Sexual activity.  Diet, exercise, and sleep habits.  History of falls.  Memory and ability to understand (cognition).  Work and work environment.  Access to firearms. What immunizations do I need? Vaccines are usually given at various ages, according to a schedule. Your health care provider will recommend vaccines for you based on your age, medical history, and lifestyle or other factors, such as travel or where you work.   What tests do I need? Blood tests  Lipid and cholesterol levels. These may be checked every 5 years, or more often depending on your overall health.  Hepatitis C test.  Hepatitis B test. Screening  Lung cancer screening. You may have this screening every year starting at age 85 if you have a 30-pack-year history of smoking and currently smoke or have quit within the past 15 years.  Colorectal  cancer screening. ? All adults should have this screening starting at age 85 and continuing until age 85. ? Your health care provider may recommend screening at age 85 if you are at increased risk. ? You will have tests every 1-10 years, depending on your results and the type of screening test.  Prostate cancer screening. Recommendations will vary depending on your family history and other risks.  Genital exam to check for testicular cancer or hernias.  Diabetes screening. ? This is done by checking your blood sugar (glucose) after you have not eaten for a while (fasting). ? You may have this done every 1-3 years.  Abdominal aortic aneurysm (AAA) screening. You may need this if you are a current or former smoker.  STD (sexually transmitted disease) testing, if you are at risk. Follow these instructions at home: Eating and drinking  Eat a diet that includes fresh fruits and vegetables, whole grains, lean protein, and low-fat dairy products. Limit your intake of foods with high amounts of sugar, saturated fats, and salt.  Take vitamin and mineral supplements as recommended by your health care provider.  Do not drink alcohol if your health care provider tells you not to drink.  If you drink alcohol: ? Limit how much you have to 0-2 drinks a day. ? Be aware of how much alcohol is in your drink. In the [place] one drink equals one 12 oz bottle of beer (355 mL), one 5 oz glass of wine (148 mL), or one 1 oz glass of hard liquor (44 mL).   Lifestyle  Take daily care of your teeth   and gums. Brush your teeth every morning and night with fluoride toothpaste. Floss one time each day.  Stay active. Exercise for at least 30 minutes 5 or more days each week.  Do not use any products that contain nicotine or tobacco, such as cigarettes, e-cigarettes, and chewing tobacco. If you need help quitting, ask your health care provider.  Do not use drugs.  If you are sexually active, practice safe sex.  Use a condom or other form of protection to prevent STIs (sexually transmitted infections).  Talk with your health care provider about taking a low-dose aspirin or statin.  Find healthy ways to cope with stress, such as: ? Meditation, yoga, or listening to music. ? Journaling. ? Talking to a trusted person. ? Spending time with friends and family. Safety  Always wear your seat belt while driving or riding in a vehicle.  Do not drive: ? If you have been drinking alcohol. Do not ride with someone who has been drinking. ? When you are tired or distracted. ? While texting.  Wear a helmet and other protective equipment during sports activities.  If you have firearms in your house, make sure you follow all gun safety procedures. What's next?  Visit your health care provider once a year for an annual wellness visit.  Ask your health care provider how often you should have your eyes and teeth checked.  Stay up to date on all vaccines. This information is not intended to replace advice given to you by your health care provider. Make sure you discuss any questions you have with your health care provider. Document Revised: 06/28/2019 Document Reviewed: 09/23/2018 Elsevier Patient Education  2021 Elsevier Inc.  

## 2020-11-02 LAB — CBC WITH DIFFERENTIAL/PLATELET
Basophils Absolute: 0 10*3/uL (ref 0.0–0.2)
Basos: 1 %
EOS (ABSOLUTE): 0.3 10*3/uL (ref 0.0–0.4)
Eos: 5 %
Hematocrit: 40.2 % (ref 37.5–51.0)
Hemoglobin: 13.7 g/dL (ref 13.0–17.7)
Immature Grans (Abs): 0 10*3/uL (ref 0.0–0.1)
Immature Granulocytes: 0 %
Lymphocytes Absolute: 0.9 10*3/uL (ref 0.7–3.1)
Lymphs: 15 %
MCH: 30.4 pg (ref 26.6–33.0)
MCHC: 34.1 g/dL (ref 31.5–35.7)
MCV: 89 fL (ref 79–97)
Monocytes Absolute: 0.6 10*3/uL (ref 0.1–0.9)
Monocytes: 9 %
Neutrophils Absolute: 4.2 10*3/uL (ref 1.4–7.0)
Neutrophils: 70 %
Platelets: 273 10*3/uL (ref 150–450)
RBC: 4.5 x10E6/uL (ref 4.14–5.80)
RDW: 13.4 % (ref 11.6–15.4)
WBC: 6 10*3/uL (ref 3.4–10.8)

## 2020-11-02 LAB — COMPREHENSIVE METABOLIC PANEL
ALT: 21 IU/L (ref 0–44)
AST: 17 IU/L (ref 0–40)
Albumin/Globulin Ratio: 2.3 — ABNORMAL HIGH (ref 1.2–2.2)
Albumin: 4.5 g/dL (ref 3.6–4.6)
Alkaline Phosphatase: 101 IU/L (ref 44–121)
BUN/Creatinine Ratio: 13 (ref 10–24)
BUN: 17 mg/dL (ref 8–27)
Bilirubin Total: 0.4 mg/dL (ref 0.0–1.2)
CO2: 25 mmol/L (ref 20–29)
Calcium: 10 mg/dL (ref 8.6–10.2)
Chloride: 103 mmol/L (ref 96–106)
Creatinine, Ser: 1.28 mg/dL — ABNORMAL HIGH (ref 0.76–1.27)
GFR calc Af Amer: 59 mL/min/{1.73_m2} — ABNORMAL LOW (ref >59)
GFR calc non Af Amer: 51 mL/min/{1.73_m2} — ABNORMAL LOW (ref >59)
Globulin, Total: 2 g/dL (ref 1.5–4.5)
Glucose: 121 mg/dL — ABNORMAL HIGH (ref 65–99)
Potassium: 4.1 mmol/L (ref 3.5–5.2)
Sodium: 141 mmol/L (ref 134–144)
Total Protein: 6.5 g/dL (ref 6.0–8.5)

## 2020-11-02 LAB — LIPID PANEL
Chol/HDL Ratio: 2.3 ratio (ref 0.0–5.0)
Cholesterol, Total: 95 mg/dL — ABNORMAL LOW (ref 100–199)
HDL: 42 mg/dL (ref >39)
LDL Chol Calc (NIH): 37 mg/dL (ref 0–99)
Triglycerides: 77 mg/dL (ref 0–149)
VLDL Cholesterol Cal: 16 mg/dL (ref 5–40)

## 2020-11-02 LAB — TSH: TSH: 1.78 u[IU]/mL (ref 0.450–4.500)

## 2020-11-09 ENCOUNTER — Telehealth: Payer: Self-pay | Admitting: *Deleted

## 2020-11-09 NOTE — Telephone Encounter (Signed)
Patient returned call and notified :  Labs all stable

## 2020-11-19 ENCOUNTER — Ambulatory Visit: Payer: Medicare HMO | Admitting: Cardiology

## 2020-12-05 ENCOUNTER — Ambulatory Visit
Admission: RE | Admit: 2020-12-05 | Discharge: 2020-12-05 | Disposition: A | Discharge status: Home / Self Care | Payer: Medicare HMO | Source: Ambulatory Visit | Attending: Neurosurgery | Admitting: Neurosurgery

## 2020-12-05 ENCOUNTER — Other Ambulatory Visit: Payer: Self-pay

## 2020-12-05 DIAGNOSIS — I671 Cerebral aneurysm, nonruptured: Secondary | ICD-10-CM | POA: Diagnosis not present

## 2020-12-05 DIAGNOSIS — I6623 Occlusion and stenosis of bilateral posterior cerebral arteries: Secondary | ICD-10-CM | POA: Diagnosis not present

## 2020-12-05 DIAGNOSIS — I6782 Cerebral ischemia: Secondary | ICD-10-CM | POA: Diagnosis not present

## 2020-12-05 DIAGNOSIS — I6501 Occlusion and stenosis of right vertebral artery: Secondary | ICD-10-CM | POA: Diagnosis not present

## 2020-12-05 DIAGNOSIS — I672 Cerebral atherosclerosis: Secondary | ICD-10-CM | POA: Diagnosis not present

## 2020-12-05 LAB — POCT I-STAT CREATININE: Creatinine, Ser: 1.3 mg/dL — ABNORMAL HIGH (ref 0.61–1.24)

## 2020-12-05 MED ORDER — IOHEXOL 350 MG/ML SOLN
75.0000 mL | Freq: Once | INTRAVENOUS | Status: AC | PRN
  Administered 2020-12-05: 75 mL via INTRAVENOUS

## 2020-12-06 ENCOUNTER — Ambulatory Visit: Admission: RE | Admit: 2020-12-06 | Payer: Medicare HMO | Source: Ambulatory Visit

## 2020-12-10 DIAGNOSIS — D225 Melanocytic nevi of trunk: Secondary | ICD-10-CM | POA: Diagnosis not present

## 2020-12-10 DIAGNOSIS — D2272 Melanocytic nevi of left lower limb, including hip: Secondary | ICD-10-CM | POA: Diagnosis not present

## 2020-12-10 DIAGNOSIS — Z85828 Personal history of other malignant neoplasm of skin: Secondary | ICD-10-CM | POA: Diagnosis not present

## 2020-12-10 DIAGNOSIS — X32XXXA Exposure to sunlight, initial encounter: Secondary | ICD-10-CM | POA: Diagnosis not present

## 2020-12-10 DIAGNOSIS — L820 Inflamed seborrheic keratosis: Secondary | ICD-10-CM | POA: Diagnosis not present

## 2020-12-10 DIAGNOSIS — D2261 Melanocytic nevi of right upper limb, including shoulder: Secondary | ICD-10-CM | POA: Diagnosis not present

## 2020-12-10 DIAGNOSIS — D2271 Melanocytic nevi of right lower limb, including hip: Secondary | ICD-10-CM | POA: Diagnosis not present

## 2020-12-10 DIAGNOSIS — L538 Other specified erythematous conditions: Secondary | ICD-10-CM | POA: Diagnosis not present

## 2020-12-10 DIAGNOSIS — L570 Actinic keratosis: Secondary | ICD-10-CM | POA: Diagnosis not present

## 2020-12-31 ENCOUNTER — Other Ambulatory Visit: Payer: Self-pay | Admitting: Cardiology

## 2020-12-31 DIAGNOSIS — I25810 Atherosclerosis of coronary artery bypass graft(s) without angina pectoris: Secondary | ICD-10-CM

## 2021-01-07 ENCOUNTER — Other Ambulatory Visit: Payer: Self-pay | Admitting: Family Medicine

## 2021-01-07 DIAGNOSIS — R42 Dizziness and giddiness: Secondary | ICD-10-CM

## 2021-01-07 DIAGNOSIS — K219 Gastro-esophageal reflux disease without esophagitis: Secondary | ICD-10-CM

## 2021-01-07 NOTE — Telephone Encounter (Signed)
Future visit in 3 months  

## 2021-01-07 NOTE — Telephone Encounter (Signed)
Requested medication (s) are due for refill today: yes  Requested medication (s) are on the active medication list: yes  Last refill:  03/29/20 #90 3 refills   Future visit scheduled: yes  Notes to clinic:  not delegated per protocol     Requested Prescriptions  Pending Prescriptions Disp Refills   meclizine (ANTIVERT) 25 MG tablet [Pharmacy Med Name: MECLIZINE HYDROCHLORIDE 25 MG Tablet] 90 tablet 3    Sig: TAKE 1 TABLET DAILY AS NEEDED FOR DIZZINESS.      Not Delegated - Gastroenterology: Antiemetics Failed - 01/07/2021  5:42 PM      Failed - This refill cannot be delegated      Passed - Valid encounter within last 6 months    Recent Outpatient Visits           2 months ago Encounter for Medicare annual wellness exam   [facility] Jerrol Banana., MD   3 months ago Hearing loss of left ear, unspecified hearing loss type   [facility] Jerrol Banana., MD   6 months ago Black stools   [facility] Jerrol Banana., MD   7 months ago [place] Jerrol Banana., MD   9 months ago Hyperlipidemia, unspecified hyperlipidemia type   [facility] Jerrol Banana., MD       Future Appointments             In 3 months Jerrol Banana., MD [facility]              Signed Prescriptions Disp Refills   pantoprazole (PROTONIX) 20 MG tablet 90 tablet 1    Sig: TAKE 1 TABLET EVERY DAY      Gastroenterology: Proton Pump Inhibitors Passed - 01/07/2021  5:42 PM      Passed - Valid encounter within last 12 months    Recent Outpatient Visits           2 months ago Encounter for Medicare annual wellness exam   [facility] Jerrol Banana., MD   3 months ago Hearing loss of left ear, unspecified hearing loss type   [facility] Jerrol Banana., MD   6 months ago Black stools   [facility] Jerrol Banana., MD   7 months ago [place] Jerrol Banana., MD   9 months ago Hyperlipidemia, unspecified hyperlipidemia type   [facility] Jerrol Banana., MD       Future Appointments             In 3 months Jerrol Banana., MD [facility], [place]

## 2021-01-19 ENCOUNTER — Other Ambulatory Visit: Payer: Self-pay | Admitting: Family Medicine

## 2021-01-19 NOTE — Telephone Encounter (Signed)
Requested Prescriptions  Pending Prescriptions Disp Refills  . doxazosin (CARDURA) 2 MG tablet [Pharmacy Med Name: DOXAZOSIN MESYLATE 2 MG Tablet] 90 tablet 1    Sig: TAKE 1 TABLET EVERY DAY     Cardiovascular:  Alpha Blockers Passed - 01/19/2021  4:56 AM      Passed - Last BP in normal range    BP Readings from Last 1 Encounters:  11/01/20 130/76         Passed - Valid encounter within last 6 months    Recent Outpatient Visits          2 months ago Encounter for [employer] annual wellness exam   [place] Jerrol Banana., MD   4 months ago Hearing loss of left ear, unspecified hearing loss type   [facility] Jerrol Banana., MD   7 months ago Black stools   [facility] Jerrol Banana., MD   8 months ago [place] Jerrol Banana., MD   9 months ago Hyperlipidemia, unspecified hyperlipidemia type   [facility] Jerrol Banana., MD      Future Appointments            In 3 months Jerrol Banana., MD [facility], [place]

## 2021-01-28 DIAGNOSIS — H40153 Residual stage of open-angle glaucoma, bilateral: Secondary | ICD-10-CM | POA: Diagnosis not present

## 2021-02-01 ENCOUNTER — Other Ambulatory Visit: Payer: Self-pay | Admitting: Family Medicine

## 2021-02-03 ENCOUNTER — Other Ambulatory Visit: Payer: Self-pay | Admitting: Family Medicine

## 2021-02-03 DIAGNOSIS — I10 Essential (primary) hypertension: Secondary | ICD-10-CM

## 2021-02-03 NOTE — Telephone Encounter (Signed)
Requested Prescriptions  Pending Prescriptions Disp Refills  . losartan-hydrochlorothiazide (HYZAAR) 100-25 MG tablet [Pharmacy Med Name: LOSARTAN POTASSIUM/HYDROCHLOROTHIAZIDE 100-25 MG Tablet] 90 tablet 0    Sig: TAKE 1 TABLET EVERY DAY     Cardiovascular: ARB + Diuretic Combos Failed - 02/03/2021  4:11 AM      Failed - Cr in normal range and within 180 days    Creat  Date Value Ref Range Status  09/02/2017 1.32 (H) 0.70 - 1.11 mg/dL Final    Comment:    For patients >42 years of age, the reference limit for Creatinine is approximately 13% higher for people identified as African-American. .    Creatinine, Ser  Date Value Ref Range Status  12/05/2020 1.30 (H) 0.61 - 1.24 mg/dL Final         Passed - K in normal range and within 180 days    Potassium  Date Value Ref Range Status  11/01/2020 4.1 3.5 - 5.2 mmol/L Final  07/12/2012 3.7 3.5 - 5.1 mmol/L Final         Passed - Na in normal range and within 180 days    Sodium  Date Value Ref Range Status  11/01/2020 141 134 - 144 mmol/L Final  07/12/2012 143 136 - 145 mmol/L Final         Passed - Ca in normal range and within 180 days    Calcium  Date Value Ref Range Status  11/01/2020 10.0 8.6 - 10.2 mg/dL Final   Calcium, Total  Date Value Ref Range Status  07/12/2012 9.3 8.5 - 10.1 mg/dL Final         Passed - Patient is not pregnant      Passed - Last BP in normal range    BP Readings from Last 1 Encounters:  11/01/20 130/76         Passed - Valid encounter within last 6 months    Recent Outpatient Visits          3 months ago Encounter for [employer] annual wellness exam   [facility] Jerrol Banana., MD   4 months ago Hearing loss of left ear, unspecified hearing loss type   [facility] Jerrol Banana., MD   7 months ago Black stools   [facility] Jerrol Banana., MD   8 months ago [place]   [facility] Jerrol Banana.,  MD   10 months ago Hyperlipidemia, unspecified hyperlipidemia type   [facility] Jerrol Banana., MD      Future Appointments            In 2 months Jerrol Banana., MD [facility], [place]

## 2021-02-07 ENCOUNTER — Other Ambulatory Visit: Payer: Self-pay | Admitting: Family Medicine

## 2021-02-07 ENCOUNTER — Other Ambulatory Visit: Payer: Self-pay | Admitting: Cardiology

## 2021-02-07 DIAGNOSIS — R42 Dizziness and giddiness: Secondary | ICD-10-CM

## 2021-02-07 MED ORDER — POTASSIUM CHLORIDE CRYS ER 20 MEQ PO TBCR: 20.0000 meq | EXTENDED_RELEASE_TABLET | Freq: Every day | ORAL | 3 refills | Status: DC

## 2021-02-07 NOTE — Telephone Encounter (Signed)
Future visit in 2 months  

## 2021-04-12 ENCOUNTER — Other Ambulatory Visit: Payer: Self-pay | Admitting: Cardiology

## 2021-04-12 ENCOUNTER — Other Ambulatory Visit: Payer: Self-pay | Admitting: Family Medicine

## 2021-04-12 DIAGNOSIS — K219 Gastro-esophageal reflux disease without esophagitis: Secondary | ICD-10-CM

## 2021-05-01 ENCOUNTER — Encounter: Payer: Self-pay | Admitting: Family Medicine

## 2021-05-01 ENCOUNTER — Ambulatory Visit (INDEPENDENT_AMBULATORY_CARE_PROVIDER_SITE_OTHER): Payer: Medicare HMO | Admitting: Family Medicine

## 2021-05-01 ENCOUNTER — Other Ambulatory Visit: Payer: Self-pay

## 2021-05-01 VITALS — BP 114/63 | HR 69 | Temp 98.5°F | Resp 16 | Ht 68.0 in | Wt 209.0 lb

## 2021-05-01 DIAGNOSIS — M1990 Unspecified osteoarthritis, unspecified site: Secondary | ICD-10-CM | POA: Diagnosis not present

## 2021-05-01 DIAGNOSIS — E7800 Pure hypercholesterolemia, unspecified: Secondary | ICD-10-CM

## 2021-05-01 DIAGNOSIS — I2510 Atherosclerotic heart disease of native coronary artery without angina pectoris: Secondary | ICD-10-CM

## 2021-05-01 DIAGNOSIS — I10 Essential (primary) hypertension: Secondary | ICD-10-CM | POA: Diagnosis not present

## 2021-05-01 NOTE — Progress Notes (Signed)
Established patient visit   Patient: [name]   DOB: January 05, 1933   87 y.o. Male  MRN: [id number] Visit Date: 05/01/2021  Today's healthcare provider: [name], MD   Chief Complaint  Patient presents with   Hypertension   Hyperlipidemia   Subjective    HPI  Presents today for recheck.  He does have chronic back pain.  He states he has back and leg pain if he walks but it goes away if he stops walking. Hypertension, follow-up  BP Readings from Last 3 Encounters:  05/01/21 114/63  11/01/20 130/76  09/17/20 139/69   Wt Readings from Last 3 Encounters:  05/01/21 209 lb (94.8 kg)  11/01/20 212 lb (96.2 kg)  09/17/20 224 lb (101.6 kg)     He was last seen for hypertension 6 months ago.  BP at that visit was 130/76. Management since that visit includes no medication changes.  He reports good compliance with treatment. He is not having side effects.  He is following a Regular diet. He is exercising. He does not smoke.  Use of agents associated with hypertension: none.   Outside blood pressures are checked occasionally. Symptoms: No chest pain No chest pressure  No palpitations No syncope  No dyspnea No orthopnea  No paroxysmal nocturnal dyspnea No lower extremity edema   Pertinent labs: Lab Results  Component Value Date   CHOL 95 (L) 11/01/2020   HDL 42 11/01/2020   LDLCALC 37 11/01/2020   TRIG 77 11/01/2020   CHOLHDL 2.3 11/01/2020   Lab Results  Component Value Date   NA 141 11/01/2020   K 4.1 11/01/2020   CREATININE 1.30 (H) 12/05/2020   GFRNONAA 51 (L) 11/01/2020   GFRAA 59 (L) 11/01/2020   GLUCOSE 121 (H) 11/01/2020     The ASCVD Risk score Mikey Bussing DC Jr., et al., 2013) failed to calculate for the following reasons:   The 2013 ASCVD risk score is only valid for ages 71 to 64   --------------------------------------------------------------------------------------------------- Lipid/Cholesterol, Follow-up  Last lipid panel Other  pertinent labs  Lab Results  Component Value Date   CHOL 95 (L) 11/01/2020   HDL 42 11/01/2020   LDLCALC 37 11/01/2020   TRIG 77 11/01/2020   CHOLHDL 2.3 11/01/2020   Lab Results  Component Value Date   ALT 21 11/01/2020   AST 17 11/01/2020   PLT 273 11/01/2020   TSH 1.780 11/01/2020     He was last seen for this 6 months ago.  Management since that visit includes no medication changes.  He reports good compliance with treatment. He is not having side effects.   Symptoms: No chest pain No chest pressure/discomfort  No dyspnea No lower extremity edema  No numbness or tingling of extremity No orthopnea  No palpitations No paroxysmal nocturnal dyspnea  No speech difficulty No syncope   Current diet: well balanced Current exercise: no regular exercise  The ASCVD Risk score ([place]., et al., 2013) failed to calculate for the following reasons:   The 2013 ASCVD risk score is only valid for ages 66 to 47       Medications: Outpatient Medications Prior to Visit  Medication Sig   amLODipine (NORVASC) 5 MG tablet TAKE 1 TABLET EVERY EVENING   aspirin 81 MG tablet Take 81 mg by mouth daily.   Cholecalciferol (VITAMIN D3) 2000 units TABS Take 1 tablet by mouth daily.   co-enzyme Q-10 30 MG capsule Take 30 mg by mouth  daily.   dorzolamide (TRUSOPT) 2 % ophthalmic solution Place 1 drop 2 (two) times daily into both eyes.    doxazosin (CARDURA) 2 MG tablet TAKE 1 TABLET EVERY DAY   meclizine (ANTIVERT) 25 MG tablet TAKE 1 TABLET DAILY AS NEEDED FOR DIZZINESS.   fluticasone (FLONASE) 50 MCG/ACT nasal spray USE 2 SPRAYS IN EACH NOSTRIL EVERY DAY   ibuprofen (ADVIL,MOTRIN) 200 MG tablet Take 200 mg by mouth daily as needed for moderate pain.   labetalol (NORMODYNE) 200 MG tablet TAKE 1 TABLET TWICE DAILY   latanoprost (XALATAN) 0.005 % ophthalmic solution 1 drop at bedtime.   loratadine (CLARITIN) 10 MG tablet Take 1 tablet (10 mg total) by mouth daily.   LORazepam (ATIVAN) 0.5  MG tablet Take 1 tablet (0.5 mg total) by mouth at bedtime as needed. for sleep   losartan-hydrochlorothiazide (HYZAAR) 100-25 MG tablet TAKE 1 TABLET EVERY DAY   mometasone (ELOCON) 0.1 % cream APPLY TO ITCHY EARS TWICE DAILY FOR TWELVE DAYS. MAY REPEAT AS NEEDED FOR ITCHY EARS.   Omega-3 Fatty Acids (FISH OIL) 1000 MG CAPS Take by mouth daily.   pantoprazole (PROTONIX) 20 MG tablet TAKE 1 TABLET EVERY DAY   potassium chloride SA (KLOR-CON) 20 MEQ tablet Take 1 tablet (20 mEq total) by mouth daily.   RHOPRESSA 0.02 % SOLN Apply 1 drop to eye at bedtime.   rosuvastatin (CRESTOR) 40 MG tablet TAKE 1 TABLET EVERY DAY   No facility-administered medications prior to visit.    Review of Systems  Constitutional:  Negative for activity change and fatigue.  Respiratory:  Negative for cough and shortness of breath.   Cardiovascular:  Negative for chest pain, palpitations and leg swelling.  Musculoskeletal:  Negative for arthralgias and myalgias.  Neurological:  Negative for dizziness, light-headedness and headaches.       Objective    BP 114/63   Pulse 69   Temp 98.5 F (36.9 C)   Resp 16   Ht 5\' 8"  (1.727 m)   Wt 209 lb (94.8 kg)   BMI 31.78 kg/m  BP Readings from Last 3 Encounters:  05/01/21 114/63  11/01/20 130/76  09/17/20 139/69   Wt Readings from Last 3 Encounters:  05/01/21 209 lb (94.8 kg)  11/01/20 212 lb (96.2 kg)  09/17/20 224 lb (101.6 kg)       Physical Exam Vitals reviewed.  Constitutional:      Appearance: He is well-developed.  HENT:     Head: Normocephalic and atraumatic.     Right Ear: External ear normal.     Left Ear: External ear normal.  Eyes:     General: No scleral icterus.    Conjunctiva/sclera: Conjunctivae normal.  Neck:     Thyroid: No thyromegaly.  Cardiovascular:     Rate and Rhythm: Normal rate and regular rhythm.     Heart sounds: Normal heart sounds.  Pulmonary:     Effort: Pulmonary effort is normal.     Breath sounds: Normal  breath sounds.  Abdominal:     Palpations: Abdomen is soft.  Musculoskeletal:     Comments: He has had a lipoma at the proximal portion of the left clavicle for years.  Skin:    General: Skin is warm and dry.     Comments: Many SKs.  Neurological:     General: No focal deficit present.     Mental Status: He is alert and oriented to person, place, and time.  Psychiatric:  Mood and Affect: Mood normal.        Behavior: Behavior normal.        Thought Content: Thought content normal.        Judgment: Judgment normal.      No results found for any visits on 05/01/21.  Assessment & Plan     1. Arteriosclerosis of coronary artery Risk factors treated.  Sees cardiology.  2. Osteoarthritis, unspecified osteoarthritis type, unspecified site Also with back pain.  All arthritic versus degenerative disc disease versus spinal stenosis. For now as it does not day-to-day functioning.  3. Pure hypercholesterolemia Rosuvastatin 40  4. Essential hypertension On amlodipine 5 losartan HCT 100/25   No follow-ups on file.      I, [name], MD, have reviewed all documentation for this visit. The documentation on 05/05/21 for the exam, diagnosis, procedures, and orders are all accurate and complete.    Layken Doenges Cranford Mon, MD  [facility] [phone] (phone) [phone] (fax)  [place]

## 2021-05-09 ENCOUNTER — Other Ambulatory Visit: Payer: Self-pay | Admitting: Family Medicine

## 2021-05-09 ENCOUNTER — Other Ambulatory Visit: Payer: Self-pay

## 2021-05-09 DIAGNOSIS — G4700 Insomnia, unspecified: Secondary | ICD-10-CM

## 2021-05-09 DIAGNOSIS — K219 Gastro-esophageal reflux disease without esophagitis: Secondary | ICD-10-CM

## 2021-05-09 DIAGNOSIS — I10 Essential (primary) hypertension: Secondary | ICD-10-CM

## 2021-05-09 MED ORDER — LORAZEPAM 0.5 MG PO TABS: 0.5000 mg | ORAL_TABLET | Freq: Every evening | ORAL | 1 refills | Status: DC | PRN

## 2021-05-09 NOTE — Telephone Encounter (Signed)
Requested Prescriptions  Pending Prescriptions Disp Refills  . labetalol (NORMODYNE) 200 MG tablet [Pharmacy Med Name: LABETALOL HYDROCHLORIDE 200 MG Tablet] 180 tablet 0    Sig: TAKE 1 TABLET TWICE DAILY     Cardiovascular:  Beta Blockers Passed - 05/09/2021 12:46 AM      Passed - Last BP in normal range    BP Readings from Last 1 Encounters:  05/01/21 114/63         Passed - Last Heart Rate in normal range    Pulse Readings from Last 1 Encounters:  05/01/21 69         Passed - Valid encounter within last 6 months    Recent Outpatient Visits          1 week ago Arteriosclerosis of coronary artery   [facility] Jerrol Banana., MD   6 months ago Encounter for [employer] annual wellness exam   [facility] Jerrol Banana., MD   7 months ago Hearing loss of left ear, unspecified hearing loss type   [facility] Jerrol Banana., MD   11 months ago Black stools   [facility] Jerrol Banana., MD   11 months ago [place] Jerrol Banana., MD      Future Appointments            In 2 months Crenshaw, Denice Bors, MD [facility], [facility]           . pantoprazole (PROTONIX) 20 MG tablet [Pharmacy Med Name: PANTOPRAZOLE SODIUM 20 MG Tablet Delayed Release] 90 tablet 1    Sig: TAKE 1 TABLET EVERY DAY     Gastroenterology: Proton Pump Inhibitors Passed - 05/09/2021 12:46 AM      Passed - Valid encounter within last 12 months    Recent Outpatient Visits          1 week ago Arteriosclerosis of coronary artery   [facility] Jerrol Banana., MD   6 months ago Encounter for [employer] annual wellness exam   [facility] Jerrol Banana., MD   7 months ago Hearing loss of left ear, unspecified hearing loss type   [facility] Jerrol Banana., MD   11 months ago Black stools   [facility] Jerrol Banana., MD   11 months ago [place] Jerrol Banana., MD      Future Appointments            In 2 months Crenshaw, Denice Bors, MD [facility], [facility]           . losartan-hydrochlorothiazide (HYZAAR) 100-25 MG tablet [Pharmacy Med Name: LOSARTAN POTASSIUM/HYDROCHLOROTHIAZIDE 100-25 MG Tablet] 90 tablet 0    Sig: TAKE 1 TABLET EVERY DAY     Cardiovascular: ARB + Diuretic Combos Failed - 05/09/2021 12:46 AM      Failed - K in normal range and within 180 days    Potassium  Date Value Ref Range Status  11/01/2020 4.1 3.5 - 5.2 mmol/L Final  07/12/2012 3.7 3.5 - 5.1 mmol/L Final         Failed - Na in normal range and within 180 days    Sodium  Date Value Ref Range Status  11/01/2020 141 134 - 144 mmol/L Final  07/12/2012 143 136 - 145 mmol/L Final  Failed - Cr in normal range and within 180 days    Creat  Date Value Ref Range Status  09/02/2017 1.32 (H) 0.70 - 1.11 mg/dL Final    Comment:    For patients >28 years of age, the reference limit for Creatinine is approximately 13% higher for people identified as African-American. .    Creatinine, Ser  Date Value Ref Range Status  12/05/2020 1.30 (H) 0.61 - 1.24 mg/dL Final         Failed - Ca in normal range and within 180 days    Calcium  Date Value Ref Range Status  11/01/2020 10.0 8.6 - 10.2 mg/dL Final   Calcium, Total  Date Value Ref Range Status  07/12/2012 9.3 8.5 - 10.1 mg/dL Final         Passed - Patient is not pregnant      Passed - Last BP in normal range    BP Readings from Last 1 Encounters:  05/01/21 114/63         Passed - Valid encounter within last 6 months    Recent Outpatient Visits          1 week ago Arteriosclerosis of coronary artery   [facility] Jerrol Banana., MD   6 months ago Encounter for [employer] annual wellness exam   [facility] Jerrol Banana., MD   7  months ago Hearing loss of left ear, unspecified hearing loss type   [facility] Jerrol Banana., MD   11 months ago Black stools   [facility] Jerrol Banana., MD   11 months ago [place] Jerrol Banana., MD      Future Appointments            In 2 months Crenshaw, Denice Bors, MD [facility], [facility]           . doxazosin (CARDURA) 2 MG tablet [Pharmacy Med Name: DOXAZOSIN MESYLATE 2 MG Tablet] 90 tablet 0    Sig: TAKE 1 TABLET EVERY DAY     Cardiovascular:  Alpha Blockers Passed - 05/09/2021 12:46 AM      Passed - Last BP in normal range    BP Readings from Last 1 Encounters:  05/01/21 114/63         Passed - Valid encounter within last 6 months    Recent Outpatient Visits          1 week ago Arteriosclerosis of coronary artery   [facility] Jerrol Banana., MD   6 months ago Encounter for [employer] annual wellness exam   [facility] Jerrol Banana., MD   7 months ago Hearing loss of left ear, unspecified hearing loss type   [facility] Jerrol Banana., MD   11 months ago Black stools   [facility] Jerrol Banana., MD   11 months ago [place] Jerrol Banana., MD      Future Appointments            In 2 months Crenshaw, Denice Bors, MD [place]

## 2021-05-09 NOTE — Telephone Encounter (Signed)
Please review. Thanks!  

## 2021-05-09 NOTE — Telephone Encounter (Signed)
[facility] faxed refill request for the following medications:  LORazepam (ATIVAN) 0.5 MG tablet   Please advise.  

## 2021-06-05 DIAGNOSIS — H40153 Residual stage of open-angle glaucoma, bilateral: Secondary | ICD-10-CM | POA: Diagnosis not present

## 2021-06-10 DIAGNOSIS — C4442 Squamous cell carcinoma of skin of scalp and neck: Secondary | ICD-10-CM | POA: Diagnosis not present

## 2021-06-10 DIAGNOSIS — L538 Other specified erythematous conditions: Secondary | ICD-10-CM | POA: Diagnosis not present

## 2021-06-10 DIAGNOSIS — Z85828 Personal history of other malignant neoplasm of skin: Secondary | ICD-10-CM | POA: Diagnosis not present

## 2021-06-10 DIAGNOSIS — M71342 Other bursal cyst, left hand: Secondary | ICD-10-CM | POA: Diagnosis not present

## 2021-06-10 DIAGNOSIS — L570 Actinic keratosis: Secondary | ICD-10-CM | POA: Diagnosis not present

## 2021-06-10 DIAGNOSIS — D485 Neoplasm of uncertain behavior of skin: Secondary | ICD-10-CM | POA: Diagnosis not present

## 2021-06-10 DIAGNOSIS — D225 Melanocytic nevi of trunk: Secondary | ICD-10-CM | POA: Diagnosis not present

## 2021-06-10 DIAGNOSIS — D2262 Melanocytic nevi of left upper limb, including shoulder: Secondary | ICD-10-CM | POA: Diagnosis not present

## 2021-06-10 DIAGNOSIS — C44319 Basal cell carcinoma of skin of other parts of face: Secondary | ICD-10-CM | POA: Diagnosis not present

## 2021-06-10 DIAGNOSIS — L820 Inflamed seborrheic keratosis: Secondary | ICD-10-CM | POA: Diagnosis not present

## 2021-06-10 DIAGNOSIS — D2261 Melanocytic nevi of right upper limb, including shoulder: Secondary | ICD-10-CM | POA: Diagnosis not present

## 2021-06-12 DIAGNOSIS — Z20822 Contact with and (suspected) exposure to covid-19: Secondary | ICD-10-CM | POA: Diagnosis not present

## 2021-06-12 DIAGNOSIS — Z03818 Encounter for observation for suspected exposure to other biological agents ruled out: Secondary | ICD-10-CM | POA: Diagnosis not present

## 2021-07-17 NOTE — Progress Notes (Deleted)
HPI:FU coronary artery disease status post coronary bypassing graft in May 1997. He also has a history of atrial flutter ablation. Previous abdominal ultrasound in May 2006 showed no significant renal artery stenosis and normal caliber aorta. Nuclear study 5/16 showed inferior ischemia and EF 61; we are treating medically.  CTA February 2022 showed no evidence of aneurysm and mild cavernous ICA, right V4 segment and left P2 segment narrowing but no evidence of dissection or large vessel occlusion.  Since I last saw him,  Current Outpatient Medications  Medication Sig Dispense Refill   meclizine (ANTIVERT) 25 MG tablet TAKE 1 TABLET DAILY AS NEEDED FOR DIZZINESS. 90 tablet 3   amLODipine (NORVASC) 5 MG tablet TAKE 1 TABLET EVERY EVENING 90 tablet 3   aspirin 81 MG tablet Take 81 mg by mouth daily.     Cholecalciferol (VITAMIN D3) 2000 units TABS Take 1 tablet by mouth daily.     co-enzyme Q-10 30 MG capsule Take 30 mg by mouth daily.     dorzolamide (TRUSOPT) 2 % ophthalmic solution Place 1 drop 2 (two) times daily into both eyes.      doxazosin (CARDURA) 2 MG tablet TAKE 1 TABLET EVERY DAY 90 tablet 0   fluticasone (FLONASE) 50 MCG/ACT nasal spray USE 2 SPRAYS IN EACH NOSTRIL EVERY DAY 48 g 1   ibuprofen (ADVIL,MOTRIN) 200 MG tablet Take 200 mg by mouth daily as needed for moderate pain.     labetalol (NORMODYNE) 200 MG tablet TAKE 1 TABLET TWICE DAILY 180 tablet 0   latanoprost (XALATAN) 0.005 % ophthalmic solution 1 drop at bedtime.     loratadine (CLARITIN) 10 MG tablet Take 1 tablet (10 mg total) by mouth daily. 30 tablet 11   LORazepam (ATIVAN) 0.5 MG tablet Take 1 tablet (0.5 mg total) by mouth at bedtime as needed. for sleep 90 tablet 1   losartan-hydrochlorothiazide (HYZAAR) 100-25 MG tablet TAKE 1 TABLET EVERY DAY 90 tablet 0   mometasone (ELOCON) 0.1 % cream APPLY TO ITCHY EARS TWICE DAILY FOR TWELVE DAYS. MAY REPEAT AS NEEDED FOR ITCHY EARS. 45 g 2   Omega-3 Fatty Acids (FISH  OIL) 1000 MG CAPS Take by mouth daily.     pantoprazole (PROTONIX) 20 MG tablet TAKE 1 TABLET EVERY DAY 90 tablet 1   potassium chloride SA (KLOR-CON) 20 MEQ tablet Take 1 tablet (20 mEq total) by mouth daily. 90 tablet 3   RHOPRESSA 0.02 % SOLN Apply 1 drop to eye at bedtime.     rosuvastatin (CRESTOR) 40 MG tablet TAKE 1 TABLET EVERY DAY 90 tablet 3   No current facility-administered medications for this visit.     Past Medical History:  Diagnosis Date   Anemia, unspecified    Anxiety state, unspecified    Cancer ([place])    skin   Coronary atherosclerosis    GERD (gastroesophageal reflux disease)    Osteoarthritis    group B streptococcal septic arthritis   Unspecified essential hypertension     No past surgical history on file.  Social History   Socioeconomic History   Marital status: Divorced    Spouse name: Not on file   Number of children: 3   Years of education: Not on file   Highest education level: Some college, no degree  Occupational History   Occupation: retired  Tobacco Use   Smoking status: Never   Smokeless tobacco: Never  Vaping Use   Vaping Use: Never used  Substance and Sexual  Activity   Alcohol use: Not Currently    Comment: drinks a beer occasionally   Drug use: No   Sexual activity: Not on file  Other Topics Concern   Not on file  Social History Narrative   Not on file   Social Determinants of Health   Financial Resource Strain: Not on file  Food Insecurity: Not on file  Transportation Needs: Not on file  Physical Activity: Not on file  Stress: Not on file  Social Connections: Not on file  Intimate Partner Violence: Not on file    Family History  Problem Relation Age of Onset   Parkinson's disease Mother    Heart disease Mother    Dementia Mother    Cancer Father        lung cancer, smoker   Depression Sister    Fibromyalgia Sister    Arthritis Brother        B/L knee replacement   Sleep apnea Brother     ROS: no fevers or  chills, productive cough, hemoptysis, dysphasia, odynophagia, melena, hematochezia, dysuria, hematuria, rash, seizure activity, orthopnea, PND, pedal edema, claudication. Remaining systems are negative.  Physical Exam: Well-developed well-nourished in no acute distress.  Skin is warm and dry.  HEENT is normal.  Neck is supple.  Chest is clear to auscultation with normal expansion.  Cardiovascular exam is regular rate and rhythm.  Abdominal exam nontender or distended. No masses palpated. Extremities show no edema. neuro grossly intact  ECG- personally reviewed  A/P  1 coronary artery disease-patient doing well with no chest pain.  Continue aspirin and statin.  2 hypertension-blood pressure controlled.  Continue present medical regimen.  3 hyperlipidemia-continue statin.  Kirk Ruths, MD

## 2021-07-18 DIAGNOSIS — C4442 Squamous cell carcinoma of skin of scalp and neck: Secondary | ICD-10-CM | POA: Diagnosis not present

## 2021-07-18 DIAGNOSIS — L905 Scar conditions and fibrosis of skin: Secondary | ICD-10-CM | POA: Diagnosis not present

## 2021-07-23 ENCOUNTER — Telehealth: Payer: Self-pay

## 2021-07-23 NOTE — Telephone Encounter (Signed)
Copied from [place] [id number]. Topic: Appointment Scheduling - Scheduling Inquiry for Clinic >> Jul 23, 2021  1:45 PM Oneta Rack wrote: Reason for CRM:  patient has appointment with cardiology on Thursday and patient does not want to go. Patient wants to cancel appointment and see PCP for EKG. Patient states all specialist does is check his ankles and ask if he is doing fine, patient states PCP can do that. Patient states he will call back because he is doing yard work and he does not want to miss the call regarding what PCP advised. Patient would like to be work in today or prior to  Thursday.

## 2021-07-24 NOTE — Telephone Encounter (Signed)
Patient was advised.  

## 2021-07-25 ENCOUNTER — Ambulatory Visit: Payer: Medicare HMO | Admitting: Cardiology

## 2021-08-01 DIAGNOSIS — L905 Scar conditions and fibrosis of skin: Secondary | ICD-10-CM | POA: Diagnosis not present

## 2021-08-01 DIAGNOSIS — C44319 Basal cell carcinoma of skin of other parts of face: Secondary | ICD-10-CM | POA: Diagnosis not present

## 2021-09-12 DIAGNOSIS — H40153 Residual stage of open-angle glaucoma, bilateral: Secondary | ICD-10-CM | POA: Diagnosis not present

## 2021-09-12 DIAGNOSIS — H524 Presbyopia: Secondary | ICD-10-CM | POA: Diagnosis not present

## 2021-09-24 ENCOUNTER — Ambulatory Visit (INDEPENDENT_AMBULATORY_CARE_PROVIDER_SITE_OTHER): Payer: Medicare HMO | Admitting: Physician Assistant

## 2021-09-24 ENCOUNTER — Encounter: Payer: Self-pay | Admitting: Physician Assistant

## 2021-09-24 ENCOUNTER — Other Ambulatory Visit: Payer: Self-pay

## 2021-09-24 VITALS — BP 122/80 | HR 64 | Temp 97.3°F | Resp 16 | Wt 195.0 lb

## 2021-09-24 DIAGNOSIS — L720 Epidermal cyst: Secondary | ICD-10-CM

## 2021-09-24 NOTE — Progress Notes (Signed)
Established patient visit   Patient: [name]   DOB: [birth year]   87 y.o. Male  MRN: [id number] Visit Date: 09/24/2021  Today's healthcare provider: Almon Register, PA-C   Chief Complaint  Patient presents with   Cyst   Subjective    HPI  Patient is an 87 year old with c/o cyst. Location: Mid back. Reports that her daughter squeeze it and pus came out. Right now he reports it feels tender. He repots is about a month that he has been having it.  Patient reports that he was scratching his back and feels he may have been too aggressive with back scratcher and caused mild bleeding After cleaning area up he noticed it seemed to be a cyst  He had his daughter look at it and she was able to expel exudate and blood from the area.  Seemed as though the area was improving but he notes lingering tenderness and irritation  Reports continued drainage on his shirts.  He does not report a previous incident like this.   Medications: Outpatient Medications Prior to Visit  Medication Sig   amLODipine (NORVASC) 5 MG tablet TAKE 1 TABLET EVERY EVENING   aspirin 81 MG tablet Take 81 mg by mouth daily.   Cholecalciferol (VITAMIN D3) 2000 units TABS Take 1 tablet by mouth daily.   co-enzyme Q-10 30 MG capsule Take 30 mg by mouth daily.   dorzolamide (TRUSOPT) 2 % ophthalmic solution Place 1 drop 2 (two) times daily into both eyes.    doxazosin (CARDURA) 2 MG tablet TAKE 1 TABLET EVERY DAY   fluticasone (FLONASE) 50 MCG/ACT nasal spray USE 2 SPRAYS IN EACH NOSTRIL EVERY DAY   ibuprofen (ADVIL,MOTRIN) 200 MG tablet Take 200 mg by mouth daily as needed for moderate pain.   labetalol (NORMODYNE) 200 MG tablet TAKE 1 TABLET TWICE DAILY   latanoprost (XALATAN) 0.005 % ophthalmic solution 1 drop at bedtime.   loratadine (CLARITIN) 10 MG tablet Take 1 tablet (10 mg total) by mouth daily.   LORazepam (ATIVAN) 0.5 MG tablet Take 1 tablet (0.5 mg total) by mouth at bedtime as needed. for sleep    losartan-hydrochlorothiazide (HYZAAR) 100-25 MG tablet TAKE 1 TABLET EVERY DAY   meclizine (ANTIVERT) 25 MG tablet TAKE 1 TABLET DAILY AS NEEDED FOR DIZZINESS.   mometasone (ELOCON) 0.1 % cream APPLY TO ITCHY EARS TWICE DAILY FOR TWELVE DAYS. MAY REPEAT AS NEEDED FOR ITCHY EARS.   Omega-3 Fatty Acids (FISH OIL) 1000 MG CAPS Take by mouth daily.   pantoprazole (PROTONIX) 20 MG tablet TAKE 1 TABLET EVERY DAY   potassium chloride SA (KLOR-CON) 20 MEQ tablet Take 1 tablet (20 mEq total) by mouth daily.   RHOPRESSA 0.02 % SOLN Apply 1 drop to eye at bedtime.   rosuvastatin (CRESTOR) 40 MG tablet TAKE 1 TABLET EVERY DAY   No facility-administered medications prior to visit.    Review of Systems  Constitutional:  Negative for chills, fatigue, fever and unexpected weight change.  Gastrointestinal:  Negative for nausea and vomiting.  Skin:        Concern for cyst on back with active drainage    Neurological:  Negative for syncope, light-headedness and headaches.      Objective    BP 122/80 (BP Location: Left Arm, Patient Position: Sitting, Cuff Size: Large)    Pulse 64    Temp (!) 97.3 F (36.3 C) (Oral)    Resp 16    Wt 195  lb (88.5 kg)    SpO2 95%    BMI 29.65 kg/m  {Show previous vital signs (optional):23777}  Physical Exam Constitutional:      Appearance: Normal appearance.  HENT:     Head: Normocephalic.  Eyes:     General: Lids are normal.     Conjunctiva/sclera: Conjunctivae normal.  Pulmonary:     Effort: Pulmonary effort is normal.  Skin:    General: Skin is warm and dry.     Findings: Lesion present.       Neurological:     Mental Status: He is alert.  Psychiatric:        Behavior: Behavior is cooperative.      No results found for any visits on 09/24/21.  Assessment & Plan     Problem List Items Addressed This Visit       Musculoskeletal and Integument   Epidermoid cyst of skin of back    Patient presents with what appears to be a previously drained  epidermoid cyst on the left side of his back.  Area is dry with scab and does not have signs of active drainage or infection. Provided instructions on keeping area clean with mild soap and water, dry and reducing superficial trauma Patient can use Aquafor or Vaselin for moisture retention and reducing irritation/itchiness Return precautions given for signs of infection or re-emergence.         No follow-ups on file.   I, Haizlee Henton E Edwena Mayorga, PA-C, have reviewed all documentation for this visit. The documentation on 09/24/21 for the exam, diagnosis, procedures, and orders are all accurate and complete.   Siarra Gilkerson, [place] MPH [facility]   No follow-ups on file.      Almon Register, PA-C  [employer] [phone] (phone) [phone] (fax)  [place]

## 2021-09-24 NOTE — Assessment & Plan Note (Signed)
Patient presents with what appears to be a previously drained epidermoid cyst on the left side of his back.  Area is dry with scab and does not have signs of active drainage or infection. Provided instructions on keeping area clean with mild soap and water, dry and reducing superficial trauma Patient can use Aquafor or Vaselin for moisture retention and reducing irritation/itchiness Return precautions given for signs of infection or re-emergence.

## 2021-09-24 NOTE — Progress Notes (Deleted)
Acute Office Visit  Subjective:    Patient ID: [name] Clum, male    DOB: 1933-05-26, 87 y.o.   MRN: [id number]  No chief complaint on file.   HPI Patient is in today for ***  Past Medical History:  Diagnosis Date   Anemia, unspecified    Anxiety state, unspecified    Cancer ([place])    skin   Coronary atherosclerosis    GERD (gastroesophageal reflux disease)    Osteoarthritis    group B streptococcal septic arthritis   Unspecified essential hypertension     No past surgical history on file.  Family History  Problem Relation Age of Onset   Parkinson's disease Mother    Heart disease Mother    Dementia Mother    Cancer Father        lung cancer, smoker   Depression Sister    Fibromyalgia Sister    Arthritis Brother        B/L knee replacement   Sleep apnea Brother     Social History   Socioeconomic History   Marital status: Divorced    Spouse name: Not on file   Number of children: 3   Years of education: Not on file   Highest education level: Some college, no degree  Occupational History   Occupation: retired  Tobacco Use   Smoking status: Never   Smokeless tobacco: Never  Scientific laboratory technician Use: Never used  Substance and Sexual Activity   Alcohol use: Not Currently    Comment: drinks a beer occasionally   Drug use: No   Sexual activity: Not on file  Other Topics Concern   Not on file  Social History Narrative   Not on file   Social Determinants of Health   Financial Resource Strain: Not on file  Food Insecurity: Not on file  Transportation Needs: Not on file  Physical Activity: Not on file  Stress: Not on file  Social Connections: Not on file  Intimate Partner Violence: Not on file    Outpatient Medications Prior to Visit  Medication Sig Dispense Refill   meclizine (ANTIVERT) 25 MG tablet TAKE 1 TABLET DAILY AS NEEDED FOR DIZZINESS. 90 tablet 3   amLODipine (NORVASC) 5 MG tablet TAKE 1 TABLET EVERY EVENING 90 tablet 3   aspirin 81 MG  tablet Take 81 mg by mouth daily.     Cholecalciferol (VITAMIN D3) 2000 units TABS Take 1 tablet by mouth daily.     co-enzyme Q-10 30 MG capsule Take 30 mg by mouth daily.     dorzolamide (TRUSOPT) 2 % ophthalmic solution Place 1 drop 2 (two) times daily into both eyes.      doxazosin (CARDURA) 2 MG tablet TAKE 1 TABLET EVERY DAY 90 tablet 0   fluticasone (FLONASE) 50 MCG/ACT nasal spray USE 2 SPRAYS IN EACH NOSTRIL EVERY DAY 48 g 1   ibuprofen (ADVIL,MOTRIN) 200 MG tablet Take 200 mg by mouth daily as needed for moderate pain.     labetalol (NORMODYNE) 200 MG tablet TAKE 1 TABLET TWICE DAILY 180 tablet 0   latanoprost (XALATAN) 0.005 % ophthalmic solution 1 drop at bedtime.     loratadine (CLARITIN) 10 MG tablet Take 1 tablet (10 mg total) by mouth daily. 30 tablet 11   LORazepam (ATIVAN) 0.5 MG tablet Take 1 tablet (0.5 mg total) by mouth at bedtime as needed. for sleep 90 tablet 1   losartan-hydrochlorothiazide (HYZAAR) 100-25 MG tablet TAKE 1 TABLET EVERY DAY 90  tablet 0   mometasone (ELOCON) 0.1 % cream APPLY TO ITCHY EARS TWICE DAILY FOR TWELVE DAYS. MAY REPEAT AS NEEDED FOR ITCHY EARS. 45 g 2   Omega-3 Fatty Acids (FISH OIL) 1000 MG CAPS Take by mouth daily.     pantoprazole (PROTONIX) 20 MG tablet TAKE 1 TABLET EVERY DAY 90 tablet 1   potassium chloride SA (KLOR-CON) 20 MEQ tablet Take 1 tablet (20 mEq total) by mouth daily. 90 tablet 3   RHOPRESSA 0.02 % SOLN Apply 1 drop to eye at bedtime.     rosuvastatin (CRESTOR) 40 MG tablet TAKE 1 TABLET EVERY DAY 90 tablet 3   No facility-administered medications prior to visit.    No Known Allergies  Review of Systems     Objective:    Physical Exam  There were no vitals taken for this visit. Wt Readings from Last 3 Encounters:  05/01/21 209 lb (94.8 kg)  11/01/20 212 lb (96.2 kg)  09/17/20 224 lb (101.6 kg)    Health Maintenance Due  Topic Date Due   COVID-19 Vaccine (4 - Booster for Moderna series) 10/05/2020    There  are no preventive care reminders to display for this patient.   Lab Results  Component Value Date   TSH 1.780 11/01/2020   Lab Results  Component Value Date   WBC 6.0 11/01/2020   HGB 13.7 11/01/2020   HCT 40.2 11/01/2020   MCV 89 11/01/2020   PLT 273 11/01/2020   Lab Results  Component Value Date   NA 141 11/01/2020   K 4.1 11/01/2020   CO2 25 11/01/2020   GLUCOSE 121 (H) 11/01/2020   BUN 17 11/01/2020   CREATININE 1.30 (H) 12/05/2020   BILITOT 0.4 11/01/2020   ALKPHOS 101 11/01/2020   AST 17 11/01/2020   ALT 21 11/01/2020   PROT 6.5 11/01/2020   ALBUMIN 4.5 11/01/2020   CALCIUM 10.0 11/01/2020   ANIONGAP 11 08/07/2020   Lab Results  Component Value Date   CHOL 95 (L) 11/01/2020   Lab Results  Component Value Date   HDL 42 11/01/2020   Lab Results  Component Value Date   LDLCALC 37 11/01/2020   Lab Results  Component Value Date   TRIG 77 11/01/2020   Lab Results  Component Value Date   CHOLHDL 2.3 11/01/2020   Lab Results  Component Value Date   HGBA1C 6.0 (H) 08/07/2016       Assessment & Plan:   Problem List Items Addressed This Visit   None    No orders of the defined types were placed in this encounter.    Anzel Kearse E Onix Jumper, PA-C

## 2021-09-24 NOTE — Patient Instructions (Signed)
You likely had an epidermoid cyst that has been drained  Keep the area clean - mild soap and water should be sufficient while in the shower. Try to use a gentle sponge or washcloth to prevent further irritation to the area You do not need to keep applying peroxide or alcohol to the area anymore. Since it has scabbed over you can leave it open to the air to continue to heal If the area feels dry or itchy you can apply Aquafor or Vaselin to the area to keep it moist and prevent discomfort. If you are able you should examine the area everyday to make sure it isn't getting red, warm, draining pus or blood, swelling  If you notice any of those things please make an apt with [place] to re-examine the area

## 2021-09-27 ENCOUNTER — Other Ambulatory Visit: Payer: Self-pay | Admitting: Family Medicine

## 2021-09-27 ENCOUNTER — Other Ambulatory Visit: Payer: Self-pay | Admitting: Cardiology

## 2021-09-27 DIAGNOSIS — I25810 Atherosclerosis of coronary artery bypass graft(s) without angina pectoris: Secondary | ICD-10-CM

## 2021-09-27 DIAGNOSIS — R42 Dizziness and giddiness: Secondary | ICD-10-CM

## 2021-09-28 NOTE — Telephone Encounter (Signed)
Requested Prescriptions  Pending Prescriptions Disp Refills   fluticasone (FLONASE) 50 MCG/ACT nasal spray [Pharmacy Med Name: FLUTICASONE PROPIONATE 50 MCG/ACT Suspension] 48 g 1    Sig: USE 2 SPRAYS IN EACH NOSTRIL EVERY DAY     Ear, Nose, and Throat: Nasal Preparations - Corticosteroids Passed - 09/27/2021  4:49 PM      Passed - Valid encounter within last 12 months    Recent Outpatient Visits          4 days ago Epidermoid cyst of skin of back   [employer], [place] E, PA-C   5 months ago Arteriosclerosis of coronary artery   [facility] Jerrol Banana., MD   11 months ago Encounter for [employer] annual wellness exam   [facility] Jerrol Banana., MD   1 year ago Hearing loss of left ear, unspecified hearing loss type   [facility] Jerrol Banana., MD   1 year ago Black stools   [facility] Jerrol Banana., MD

## 2021-10-17 DIAGNOSIS — D2262 Melanocytic nevi of left upper limb, including shoulder: Secondary | ICD-10-CM | POA: Diagnosis not present

## 2021-10-17 DIAGNOSIS — D2261 Melanocytic nevi of right upper limb, including shoulder: Secondary | ICD-10-CM | POA: Diagnosis not present

## 2021-10-17 DIAGNOSIS — L570 Actinic keratosis: Secondary | ICD-10-CM | POA: Diagnosis not present

## 2021-10-17 DIAGNOSIS — L821 Other seborrheic keratosis: Secondary | ICD-10-CM | POA: Diagnosis not present

## 2021-10-17 DIAGNOSIS — X32XXXA Exposure to sunlight, initial encounter: Secondary | ICD-10-CM | POA: Diagnosis not present

## 2021-10-17 DIAGNOSIS — D2271 Melanocytic nevi of right lower limb, including hip: Secondary | ICD-10-CM | POA: Diagnosis not present

## 2021-10-17 DIAGNOSIS — D485 Neoplasm of uncertain behavior of skin: Secondary | ICD-10-CM | POA: Diagnosis not present

## 2021-10-17 DIAGNOSIS — Z85828 Personal history of other malignant neoplasm of skin: Secondary | ICD-10-CM | POA: Diagnosis not present

## 2021-10-17 DIAGNOSIS — B078 Other viral warts: Secondary | ICD-10-CM | POA: Diagnosis not present

## 2021-10-31 ENCOUNTER — Ambulatory Visit: Payer: Medicare HMO | Admitting: Cardiology

## 2021-11-04 ENCOUNTER — Encounter: Payer: Self-pay | Admitting: Family Medicine

## 2021-11-04 DIAGNOSIS — I10 Essential (primary) hypertension: Secondary | ICD-10-CM | POA: Diagnosis not present

## 2021-11-04 DIAGNOSIS — E7800 Pure hypercholesterolemia, unspecified: Secondary | ICD-10-CM | POA: Diagnosis not present

## 2021-11-04 DIAGNOSIS — I25810 Atherosclerosis of coronary artery bypass graft(s) without angina pectoris: Secondary | ICD-10-CM | POA: Insufficient documentation

## 2021-11-07 ENCOUNTER — Other Ambulatory Visit: Payer: Self-pay | Admitting: Family Medicine

## 2021-11-07 DIAGNOSIS — R42 Dizziness and giddiness: Secondary | ICD-10-CM

## 2021-11-07 DIAGNOSIS — I10 Essential (primary) hypertension: Secondary | ICD-10-CM

## 2021-11-07 NOTE — Telephone Encounter (Signed)
Requested medication (s) are due for refill today: yes  Requested medication (s) are on the active medication list: yes  Last refill:  08/21/21  Future visit scheduled: no  Notes to clinic:  This medication can not be delegated, please assess.    Requested Prescriptions  Pending Prescriptions Disp Refills   meclizine (ANTIVERT) 25 MG tablet [Pharmacy Med Name: MECLIZINE HYDROCHLORIDE 25 MG Tablet] 90 tablet 3    Sig: TAKE 1 TABLET DAILY AS NEEDED FOR DIZZINESS.     Not Delegated - Gastroenterology: Antiemetics Failed - 11/07/2021  5:39 AM      Failed - This refill cannot be delegated      Passed - Valid encounter within last 6 months    Recent Outpatient Visits           1 month ago Epidermoid cyst of skin of back   [place], Dani Gobble, PA-C   6 months ago Arteriosclerosis of coronary artery   [facility] Jerrol Banana., MD   1 year ago Encounter for Medicare annual wellness exam   [facility] Jerrol [facility] MD   1 year ago Hearing loss of left ear, unspecified hearing loss type   [facility] Jerrol Banana., MD   1 year ago Black stools   [facility] Jerrol Banana., MD              Signed Prescriptions Disp Refills   losartan-hydrochlorothiazide (HYZAAR) 100-25 MG tablet 90 tablet 0    Sig: TAKE 1 TABLET EVERY DAY     Cardiovascular: ARB + Diuretic Combos Failed - 11/07/2021  5:39 AM      Failed - K in normal range and within 180 days    Potassium  Date Value Ref Range Status  11/01/2020 4.1 3.5 - 5.2 mmol/L Final  07/12/2012 3.7 3.5 - 5.1 mmol/L Final          Failed - Na in normal range and within 180 days    Sodium  Date Value Ref Range Status  11/01/2020 141 134 - 144 mmol/L Final  07/12/2012 143 136 - 145 mmol/L Final          Failed - Cr in normal range and within 180 days    Creat  Date Value Ref Range Status  09/02/2017 1.32 (H) 0.70  - 1.11 mg/dL Final    Comment:    For patients >7 years of age, the reference limit for Creatinine is approximately 13% higher for people identified as African-American. .    Creatinine, Ser  Date Value Ref Range Status  12/05/2020 1.30 (H) 0.61 - 1.24 mg/dL Final          Failed - Ca in normal range and within 180 days    Calcium  Date Value Ref Range Status  11/01/2020 10.0 8.6 - 10.2 mg/dL Final   Calcium, Total  Date Value Ref Range Status  07/12/2012 9.3 8.5 - 10.1 mg/dL Final          Passed - Patient is not pregnant      Passed - Last BP in normal range    BP Readings from Last 1 Encounters:  09/24/21 122/80          Passed - Valid encounter within last 6 months    Recent Outpatient Visits           1 month ago Epidermoid cyst of skin of back  [employer], Dani Gobble, PA-C   6 months ago Arteriosclerosis of coronary artery   [facility] Jerrol Banana., MD   1 year ago Encounter for [employer] annual wellness exam   [facility] Jerrol Banana., MD   1 year ago Hearing loss of left ear, unspecified hearing loss type   [facility] Jerrol Banana., MD   1 year ago Black stools   [facility] Jerrol Banana., MD               labetalol (NORMODYNE) 200 MG tablet 180 tablet 0    Sig: TAKE 1 TABLET TWICE DAILY     Cardiovascular:  Beta Blockers Passed - 11/07/2021  5:39 AM      Passed - Last BP in normal range    BP Readings from Last 1 Encounters:  09/24/21 122/80          Passed - Last Heart Rate in normal range    Pulse Readings from Last 1 Encounters:  09/24/21 64          Passed - Valid encounter within last 6 months    Recent Outpatient Visits           1 month ago Epidermoid cyst of skin of back   [employer], [place] E, PA-C   6 months ago Arteriosclerosis of coronary artery   [facility] Jerrol [facility] MD   1 year ago Encounter for [employer] annual wellness exam   [facility] Jerrol Banana., MD   1 year ago Hearing loss of left ear, unspecified hearing loss type   [facility] Jerrol Banana., MD   1 year ago Black stools   [facility] Jerrol Banana., MD               doxazosin (CARDURA) 2 MG tablet 90 tablet 0    Sig: TAKE 1 TABLET EVERY DAY     Cardiovascular:  Alpha Blockers Passed - 11/07/2021  5:39 AM      Passed - Last BP in normal range    BP Readings from Last 1 Encounters:  09/24/21 122/80          Passed - Valid encounter within last 6 months    Recent Outpatient Visits           1 month ago Epidermoid cyst of skin of back   [place], [place] E, PA-C   6 months ago Arteriosclerosis of coronary artery   [facility] Jerrol Banana., MD   1 year ago Encounter for Medicare annual wellness exam   [facility] Jerrol Banana., MD   1 year ago Hearing loss of left ear, unspecified hearing loss type   [facility] Jerrol Banana., MD   1 year ago Black stools   [facility] Jerrol Banana., MD

## 2021-11-07 NOTE — Telephone Encounter (Signed)
Requested Prescriptions  Pending Prescriptions Disp Refills   meclizine (ANTIVERT) 25 MG tablet [Pharmacy Med Name: MECLIZINE HYDROCHLORIDE 25 MG Tablet] 90 tablet 3    Sig: TAKE 1 TABLET DAILY AS NEEDED FOR DIZZINESS.     Not Delegated - Gastroenterology: Antiemetics Failed - 11/07/2021  5:39 AM      Failed - This refill cannot be delegated      Passed - Valid encounter within last 6 months    Recent Outpatient Visits          1 month ago Epidermoid cyst of skin of back   [place], Dani Gobble, PA-C   6 months ago Arteriosclerosis of coronary artery   [facility] Jerrol Banana., MD   1 year ago Encounter for Medicare annual wellness exam   [facility] Jerrol Banana., MD   1 year ago Hearing loss of left ear, unspecified hearing loss type   [facility] Jerrol Banana., MD   1 year ago Black stools   [facility] Jerrol Banana., MD              losartan-hydrochlorothiazide (HYZAAR) 100-25 MG tablet [Pharmacy Med Name: LOSARTAN POTASSIUM/HYDROCHLOROTHIAZIDE 100-25 MG Tablet] 90 tablet 0    Sig: TAKE 1 TABLET EVERY DAY     Cardiovascular: ARB + Diuretic Combos Failed - 11/07/2021  5:39 AM      Failed - K in normal range and within 180 days    Potassium  Date Value Ref Range Status  11/01/2020 4.1 3.5 - 5.2 mmol/L Final  07/12/2012 3.7 3.5 - 5.1 mmol/L Final         Failed - Na in normal range and within 180 days    Sodium  Date Value Ref Range Status  11/01/2020 141 134 - 144 mmol/L Final  07/12/2012 143 136 - 145 mmol/L Final         Failed - Cr in normal range and within 180 days    Creat  Date Value Ref Range Status  09/02/2017 1.32 (H) 0.70 - 1.11 mg/dL Final    Comment:    For patients >44 years of age, the reference limit for Creatinine is approximately 13% higher for people identified as African-American. .    Creatinine, Ser  Date Value Ref Range  Status  12/05/2020 1.30 (H) 0.61 - 1.24 mg/dL Final         Failed - Ca in normal range and within 180 days    Calcium  Date Value Ref Range Status  11/01/2020 10.0 8.6 - 10.2 mg/dL Final   Calcium, Total  Date Value Ref Range Status  07/12/2012 9.3 8.5 - 10.1 mg/dL Final         Passed - Patient is not pregnant      Passed - Last BP in normal range    BP Readings from Last 1 Encounters:  09/24/21 122/80         Passed - Valid encounter within last 6 months    Recent Outpatient Visits          1 month ago Epidermoid cyst of skin of back   [employer], Dani Gobble, PA-C   6 months ago Arteriosclerosis of coronary artery   [facility] Jerrol Banana., MD   1 year ago Encounter for [employer] annual wellness exam   [facility] Jerrol Banana., MD   1 year  ago Hearing loss of left ear, unspecified hearing loss type   [facility] Jerrol Banana., MD   1 year ago Black stools   [facility] Jerrol Banana., MD              labetalol (NORMODYNE) 200 MG tablet [Pharmacy Med Name: LABETALOL HYDROCHLORIDE 200 MG Tablet] 180 tablet 0    Sig: TAKE 1 TABLET TWICE DAILY     Cardiovascular:  Beta Blockers Passed - 11/07/2021  5:39 AM      Passed - Last BP in normal range    BP Readings from Last 1 Encounters:  09/24/21 122/80         Passed - Last Heart Rate in normal range    Pulse Readings from Last 1 Encounters:  09/24/21 64         Passed - Valid encounter within last 6 months    Recent Outpatient Visits          1 month ago Epidermoid cyst of skin of back   [employer], [place] E, PA-C   6 months ago Arteriosclerosis of coronary artery   [facility] Jerrol Banana., MD   1 year ago Encounter for [employer] annual wellness exam   [facility] Jerrol Banana., MD   1 year ago Hearing loss of left ear,  unspecified hearing loss type   [facility] Jerrol Banana., MD   1 year ago Black stools   [facility] Jerrol Banana., MD              doxazosin (CARDURA) 2 MG tablet [Pharmacy Med Name: DOXAZOSIN MESYLATE 2 MG Tablet] 90 tablet 0    Sig: TAKE 1 TABLET EVERY DAY     Cardiovascular:  Alpha Blockers Passed - 11/07/2021  5:39 AM      Passed - Last BP in normal range    BP Readings from Last 1 Encounters:  09/24/21 122/80         Passed - Valid encounter within last 6 months    Recent Outpatient Visits          1 month ago Epidermoid cyst of skin of back   [place], [place] E, PA-C   6 months ago Arteriosclerosis of coronary artery   [facility] Jerrol Banana., MD   1 year ago Encounter for Medicare annual wellness exam   [facility] Jerrol Banana., MD   1 year ago Hearing loss of left ear, unspecified hearing loss type   [facility] Jerrol Banana., MD   1 year ago Black stools   [facility] Jerrol Banana., MD

## 2021-11-15 ENCOUNTER — Other Ambulatory Visit: Payer: Self-pay | Admitting: Family Medicine

## 2021-11-15 DIAGNOSIS — R42 Dizziness and giddiness: Secondary | ICD-10-CM

## 2021-11-15 NOTE — Telephone Encounter (Signed)
Requested medications are due for refill today.  No  Requested medications are on the active medications list.  yes  Last refill. 11/07/2021 #90 3 refills  Future visit scheduled.   no  Notes to clinic.  Medication not delegated. Medication was refilled 11/07/2021, and should not be due for refill.    Requested Prescriptions  Pending Prescriptions Disp Refills   meclizine (ANTIVERT) 25 MG tablet [Pharmacy Med Name: MECLIZINE HYDROCHLORIDE 25 MG Tablet] 90 tablet 3    Sig: TAKE 1 TABLET DAILY AS NEEDED FOR DIZZINESS.     Not Delegated - Gastroenterology: Antiemetics Failed - 11/15/2021  5:56 PM      Failed - This refill cannot be delegated      Passed - Valid encounter within last 6 months    Recent Outpatient Visits           1 month ago Epidermoid cyst of skin of back   [place], Dani Gobble, PA-C   6 months ago Arteriosclerosis of coronary artery   [facility] Jerrol Banana., MD   1 year ago Encounter for Medicare annual wellness exam   [facility] Jerrol Banana., MD   1 year ago Hearing loss of left ear, unspecified hearing loss type   [facility] Jerrol Banana., MD   1 year ago Black stools   [facility] Jerrol Banana., MD

## 2021-11-18 ENCOUNTER — Telehealth: Payer: Self-pay

## 2021-11-18 DIAGNOSIS — R42 Dizziness and giddiness: Secondary | ICD-10-CM

## 2021-11-18 MED ORDER — MECLIZINE HCL 25 MG PO TABS: ORAL_TABLET | ORAL | 3 refills | Status: DC

## 2021-11-18 NOTE — Addendum Note (Signed)
Addended by: Julieta Bellini on: 11/18/2021 03:46 PM   Modules accepted: Orders

## 2021-11-18 NOTE — Telephone Encounter (Signed)
Copied from [place] [phone]. Topic: Quick Communication - Rx Refill/Question >> Nov 18, 2021  2:27 PM Loma [place] wrote: Medication: meclizine (ANTIVERT) 25 MG tablet 90 tablet 3 11/07/2021   Sig: TAKE 1 TABLET DAILY AS NEEDED FOR DIZZINESS.  Sent to pharmacy as: meclizine (ANTIVERT) 25 MG tablet  E-Prescribing Status: Receipt confirmed by pharmacy (11/07/2021 8:02 AM EST)     Has the patient contacted their pharmacy? Yes.   (Agent: If no, request that the patient contact the pharmacy for the refill. If patient does not wish to contact the pharmacy document the reason why and proceed with request.) (Agent: If yes, when and what did the pharmacy advise?)Pharmacy called pt per patient, states to fax to [phone] however not # on file for [facility]  Preferred Pharmacy (with phone number or street name): [place], [place] [address] Phone: [phone] Fax: [phone]  Pt seemed confused about # to return request to pharmacy. Pt stated [facility] called him and said to resend this script/ Pt hard of hearing and stated that he had to play back message multiple times.   Has the patient been seen for an appointment in the last year OR does the patient have an upcoming appointment? Yes.    Agent: Please be advised that RX refills may take up to 3 business days. We ask that you follow-up with your pharmacy.

## 2021-11-27 ENCOUNTER — Other Ambulatory Visit: Payer: Self-pay | Admitting: Family Medicine

## 2021-11-27 DIAGNOSIS — G4700 Insomnia, unspecified: Secondary | ICD-10-CM

## 2021-11-28 NOTE — Telephone Encounter (Signed)
Pt wants to know if Dr. Rosanna Randy will send his medication in today and if he will put more than one refill on the order.

## 2021-12-09 ENCOUNTER — Ambulatory Visit: Payer: Self-pay | Admitting: *Deleted

## 2021-12-09 ENCOUNTER — Encounter: Payer: Self-pay | Admitting: Physician Assistant

## 2021-12-09 ENCOUNTER — Other Ambulatory Visit: Payer: Self-pay

## 2021-12-09 ENCOUNTER — Ambulatory Visit (INDEPENDENT_AMBULATORY_CARE_PROVIDER_SITE_OTHER): Payer: Medicare HMO | Admitting: Physician Assistant

## 2021-12-09 VITALS — BP 143/69 | HR 68 | Resp 16 | Ht 68.0 in | Wt 194.6 lb

## 2021-12-09 DIAGNOSIS — R0789 Other chest pain: Secondary | ICD-10-CM | POA: Diagnosis not present

## 2021-12-09 DIAGNOSIS — I10 Essential (primary) hypertension: Secondary | ICD-10-CM

## 2021-12-09 DIAGNOSIS — Z1329 Encounter for screening for other suspected endocrine disorder: Secondary | ICD-10-CM | POA: Diagnosis not present

## 2021-12-09 DIAGNOSIS — I25810 Atherosclerosis of coronary artery bypass graft(s) without angina pectoris: Secondary | ICD-10-CM | POA: Diagnosis not present

## 2021-12-09 DIAGNOSIS — R42 Dizziness and giddiness: Secondary | ICD-10-CM

## 2021-12-09 DIAGNOSIS — E7800 Pure hypercholesterolemia, unspecified: Secondary | ICD-10-CM

## 2021-12-09 NOTE — Telephone Encounter (Signed)
Pt requested appt with Dr. Rosanna Randy, explained none available within time frame appt advised. Pt agreed to see J. Ostwalt. Pt wanted to see an MD only. Pt given misinformation as noted in [place] Note. Attempted to reach pt, left VM to call back.

## 2021-12-09 NOTE — Patient Instructions (Addendum)
Please check Blood Pressure every morning until your 3-4 week follow-up appointment with PCP. Keep appointment for 01/27/2022 at 3:00 pm.

## 2021-12-09 NOTE — Progress Notes (Signed)
Established patient visit   Patient: [name]   DOB: 07/18/1933   88 y.o. Male  MRN: [id number] Visit Date: 12/09/2021  Today's healthcare provider: Mardene Speak, PA-C   Chief Complaint  Patient presents with   Dizziness   Subjective    Dizziness This is a new problem. The current episode started in the past 7 days.     Chief Complaint: Lightheadedness Symptoms: "Floaty" no spinning. No other associated symptoms. States in AMs only, "Get's better throughout day." Moderate severity as it interferes with his normal activities. Onset: 2-3 days ago Pertinent Negatives: Patient denies palpitations, SOB, CP, congestion, sinus issues, fever, chest pain, vomiting, diarrhea, bleeding. Reports he had a vertigo before but this dizziness is different.  "Not spinning".  Patient is not sure what is causing dizziness.   DIZZINESS Description of symptoms: lightheaded Triggered by rolling over in bed: no Triggered by bending over: no Aggravated by head movement: no Aggravated by exertion, coughing, loud noises: no Recent head injury: no Recent or current viral symptoms: no History of vasovagal episodes: no Nausea: no Vomiting: no Tinnitus: no Hearing loss: yes Headache: yes Photophobia/phonophobia: no Unsteady gait: yes Postural instability: no Diplopia, dysarthria, dysphagia or weakness: no Related to exertion: no Pallor: no Diaphoresis: no Dyspnea: no Chest pain: no   Medications: Outpatient Medications Prior to Visit  Medication Sig   amLODipine (NORVASC) 5 MG tablet TAKE 1 TABLET EVERY EVENING   aspirin 81 MG tablet Take 81 mg by mouth daily.   Cholecalciferol (VITAMIN D3) 2000 units TABS Take 1 tablet by mouth daily.   co-enzyme Q-10 30 MG capsule Take 30 mg by mouth daily.   dorzolamide (TRUSOPT) 2 % ophthalmic solution Place 1 drop 2 (two) times daily into both eyes.    doxazosin (CARDURA) 2 MG tablet TAKE 1 TABLET EVERY DAY   fluticasone (FLONASE) 50 MCG/ACT  nasal spray USE 2 SPRAYS IN EACH NOSTRIL EVERY DAY   ibuprofen (ADVIL,MOTRIN) 200 MG tablet Take 200 mg by mouth daily as needed for moderate pain.   labetalol (NORMODYNE) 200 MG tablet TAKE 1 TABLET TWICE DAILY   latanoprost (XALATAN) 0.005 % ophthalmic solution 1 drop at bedtime.   loratadine (CLARITIN) 10 MG tablet Take 1 tablet (10 mg total) by mouth daily.   LORazepam (ATIVAN) 0.5 MG tablet TAKE 1 TABLET AT BEDTIME AS NEEDED FOR SLEEP   losartan-hydrochlorothiazide (HYZAAR) 100-25 MG tablet TAKE 1 TABLET EVERY DAY   meclizine (ANTIVERT) 25 MG tablet TAKE 1 TABLET DAILY AS NEEDED FOR DIZZINESS.   mometasone (ELOCON) 0.1 % cream APPLY TO ITCHY EARS TWICE DAILY FOR TWELVE DAYS. MAY REPEAT AS NEEDED FOR ITCHY EARS.   Omega-3 Fatty Acids (FISH OIL) 1000 MG CAPS Take by mouth daily.   pantoprazole (PROTONIX) 20 MG tablet TAKE 1 TABLET EVERY DAY   potassium chloride SA (KLOR-CON) 20 MEQ tablet Take 1 tablet (20 mEq total) by mouth daily.   RHOPRESSA 0.02 % SOLN Apply 1 drop to eye at bedtime.   rosuvastatin (CRESTOR) 40 MG tablet TAKE 1 TABLET EVERY DAY   No facility-administered medications prior to visit.   Review of Systems  Neurological:  Positive for dizziness.  See HPI    Objective    BP (!) 143/69 (BP Location: Right Arm, Patient Position: Sitting, Cuff Size: Large)    Pulse 68    Resp 16    Ht 5\' 8"  (1.727 m)    Wt 194 lb 9.6 oz (88.3 kg)  BMI 29.59 kg/m    Physical Exam Constitutional:      Appearance: Normal appearance.  HENT:     Head: Normocephalic and atraumatic.  Eyes:     Extraocular Movements: Extraocular movements intact.     Pupils: Pupils are equal, round, and reactive to light.  Neck:     Vascular: No carotid bruit.  Cardiovascular:     Rate and Rhythm: Normal rate and regular rhythm.     Pulses: Normal pulses.     Heart sounds: Normal heart sounds.  Pulmonary:     Effort: Pulmonary effort is normal.     Breath sounds: Normal breath sounds.   Musculoskeletal:     Cervical back: Normal range of motion and neck supple. No tenderness.  Neurological:     General: No focal deficit present.     Mental Status: He is alert.     Cranial Nerves: No cranial nerve deficit.     Motor: No weakness.     Gait: Gait normal.     Assessment & Plan     1. Dizziness Unspecified etiology - Orthostatic BP was assessed 136/73 (lying) vs 121/66(standing) - EKG 12-Lead: Sinus rhythm at a rate of 61. First degree AV block. Personally reviewed. - CBC with Differential/Platelet - Comprehensive metabolic panel - Lipid panel\ - TSH  2. Essential hypertension BP today is 143/69, not at goal - CBC with Differential/Platelet - Comprehensive metabolic panel - Lipid panel - TSH  3. Atherosclerosis of coronary artery bypass graft of native heart without angina pectoris Stable. Continue ASA and Statin. Last seen by Cardiology on Follow-up with Cardiology on 05/04/22 - CBC with Differential/Platelet - Comprehensive metabolic panel - Lipid panel - TSH  4. Thyroid disorder screening Last TSH was 1 year ago. (1.780) - CBC with Differential/Platelet - Comprehensive metabolic panel - Lipid panel - TSH  5. Pure hypercholesterolemia Last lipid panel normal 1 year ago, at goal. Continue rosuvastatin 40mg  Ordered updates: - CBC with Differential/Platelet - Comprehensive metabolic panel - Lipid panel   Call if symptoms change or if not rapidly improving.    I discussed the assessment and treatment plan with Dr.Gilbert, the patient's primary physician. Dr. Rosanna Randy personally saw and assessed patient. Patient was adamant to see PA provider.The patient was provided an opportunity to ask questions and all were answered. The patient agreed with the plan and demonstrated an understanding of the instructions.   The patient was advised to call back or seek an in-person evaluation if the symptoms worsen or if the condition fails to improve as  anticipated.   The entirety of the information documented in the History of Present Illness, Review of Systems and Physical Exam were personally obtained by me. Portions of this information were initially documented by the CMA and reviewed by me for thoroughness and accuracy.     Mardene Speak, PA-C  [facility] [phone] (phone) [phone] (fax)  [place]

## 2021-12-09 NOTE — Telephone Encounter (Signed)
° °  Chief Complaint: Lightheadedness Symptoms: "Floaty" no spinning. No other associated symptoms. States in AMs only, "Get's better throughout day." Frequency: 2-3 days ago Pertinent Negatives: Patient denies palpitations, SOB, CP, congestion, sinus issues Disposition: [] ED /[] Urgent Care (no appt availability in office) / [x] Appointment(In office/virtual)/ []  [place] [facility]/ [] Home Care/ [] Refused Recommended Disposition /[] [place] [facility]/ []  Follow-up with PCP Additional Notes: Pt has appt today. States will follow disposition.    Reason for Disposition  [1] MODERATE dizziness (e.g., interferes with normal activities) AND [2] has been evaluated by physician for this  Answer Assessment - Initial Assessment Questions 1. DESCRIPTION: "Describe your dizziness."     Floaty 2. LIGHTHEADED: "Do you feel lightheaded?" (e.g., somewhat faint, woozy, weak upon standing)     Yes 3. VERTIGO: "Do you feel like either you or the room is spinning or tilting?" (i.e. vertigo)     no 4. SEVERITY: "How bad is it?"  "Do you feel like you are going to faint?" "Can you stand and walk?"   - MILD: Feels slightly dizzy, but walking normally.   - MODERATE: Feels unsteady when walking, but not falling; interferes with normal activities (e.g., school, work).   - SEVERE: Unable to walk without falling, or requires assistance to walk without falling; feels like passing out now.      Mornings moderate, "Wears off during day." 5. ONSET:  "When did the dizziness begin?"     2-3 days ago 6. AGGRAVATING FACTORS: "Does anything make it worse?" (e.g., standing, change in head position)     No 7. HEART RATE: "Can you tell me your heart rate?" "How many beats in 15 seconds?"  (Note: not all patients can do this)       no 8. CAUSE: "What do you think is causing the dizziness?"     Unsure 9. RECURRENT SYMPTOM: "Have you had dizziness before?" If Yes, ask: "When was the last time?" "What happened that  time?"     Yes vertigo. "Not spinning" 10. OTHER SYMPTOMS: "Do you have any other symptoms?" (e.g., fever, chest pain, vomiting, diarrhea, bleeding)       None  Protocols used: Dizziness - Lightheadedness-A-AH

## 2021-12-10 DIAGNOSIS — I25810 Atherosclerosis of coronary artery bypass graft(s) without angina pectoris: Secondary | ICD-10-CM | POA: Diagnosis not present

## 2021-12-10 DIAGNOSIS — Z1329 Encounter for screening for other suspected endocrine disorder: Secondary | ICD-10-CM | POA: Diagnosis not present

## 2021-12-10 DIAGNOSIS — E7800 Pure hypercholesterolemia, unspecified: Secondary | ICD-10-CM | POA: Diagnosis not present

## 2021-12-10 DIAGNOSIS — R42 Dizziness and giddiness: Secondary | ICD-10-CM | POA: Diagnosis not present

## 2021-12-10 DIAGNOSIS — I10 Essential (primary) hypertension: Secondary | ICD-10-CM | POA: Diagnosis not present

## 2021-12-11 LAB — LIPID PANEL
Chol/HDL Ratio: 2.1 ratio (ref 0.0–5.0)
Cholesterol, Total: 100 mg/dL (ref 100–199)
HDL: 47 mg/dL (ref >39)
LDL Chol Calc (NIH): 41 mg/dL (ref 0–99)
Triglycerides: 49 mg/dL (ref 0–149)
VLDL Cholesterol Cal: 12 mg/dL (ref 5–40)

## 2021-12-11 LAB — COMPREHENSIVE METABOLIC PANEL
ALT: 19 IU/L (ref 0–44)
AST: 16 IU/L (ref 0–40)
Albumin/Globulin Ratio: 2.1 (ref 1.2–2.2)
Albumin: 4.5 g/dL (ref 3.6–4.6)
Alkaline Phosphatase: 87 IU/L (ref 44–121)
BUN/Creatinine Ratio: 14 (ref 10–24)
BUN: 17 mg/dL (ref 8–27)
Bilirubin Total: 0.4 mg/dL (ref 0.0–1.2)
CO2: 23 mmol/L (ref 20–29)
Calcium: 9.8 mg/dL (ref 8.6–10.2)
Chloride: 104 mmol/L (ref 96–106)
Creatinine, Ser: 1.24 mg/dL (ref 0.76–1.27)
Globulin, Total: 2.1 g/dL (ref 1.5–4.5)
Glucose: 105 mg/dL — ABNORMAL HIGH (ref 70–99)
Potassium: 4.2 mmol/L (ref 3.5–5.2)
Sodium: 141 mmol/L (ref 134–144)
Total Protein: 6.6 g/dL (ref 6.0–8.5)
eGFR: 57 mL/min/{1.73_m2} — ABNORMAL LOW (ref >59)

## 2021-12-11 LAB — CBC WITH DIFFERENTIAL/PLATELET
Basophils Absolute: 0 10*3/uL (ref 0.0–0.2)
Basos: 1 %
EOS (ABSOLUTE): 0.2 10*3/uL (ref 0.0–0.4)
Eos: 3 %
Hematocrit: 43.6 % (ref 37.5–51.0)
Hemoglobin: 14.7 g/dL (ref 13.0–17.7)
Immature Grans (Abs): 0 10*3/uL (ref 0.0–0.1)
Immature Granulocytes: 0 %
Lymphocytes Absolute: 0.8 10*3/uL (ref 0.7–3.1)
Lymphs: 14 %
MCH: 31 pg (ref 26.6–33.0)
MCHC: 33.7 g/dL (ref 31.5–35.7)
MCV: 92 fL (ref 79–97)
Monocytes Absolute: 0.6 10*3/uL (ref 0.1–0.9)
Monocytes: 11 %
Neutrophils Absolute: 4.3 10*3/uL (ref 1.4–7.0)
Neutrophils: 71 %
Platelets: 215 10*3/uL (ref 150–450)
RBC: 4.74 x10E6/uL (ref 4.14–5.80)
RDW: 13.5 % (ref 11.6–15.4)
WBC: 6 10*3/uL (ref 3.4–10.8)

## 2021-12-11 LAB — TSH: TSH: 2.72 u[IU]/mL (ref 0.450–4.500)

## 2021-12-17 DIAGNOSIS — H40153 Residual stage of open-angle glaucoma, bilateral: Secondary | ICD-10-CM | POA: Diagnosis not present

## 2021-12-29 ENCOUNTER — Other Ambulatory Visit: Payer: Self-pay | Admitting: Cardiology

## 2022-01-06 ENCOUNTER — Other Ambulatory Visit: Payer: Self-pay | Admitting: Family Medicine

## 2022-01-06 DIAGNOSIS — K219 Gastro-esophageal reflux disease without esophagitis: Secondary | ICD-10-CM

## 2022-01-07 NOTE — Progress Notes (Signed)
?  ? ? ?Established patient visit ? ? ?Patient: [name]   DOB: 08-01-1933   88 y.o. Male  MRN: [id number] ?Visit Date: 01/08/2022 ? ?Today's healthcare provider: [name], MD  ? ?Chief Complaint  ?Patient presents with  ? Follow-up  ? ?Subjective  ?  ?HPI  ?He states he is feeling better.  His main thing in the notes overall is that his balance and walking are not as steady as it used to be.  This was not a sudden event.  No palpitations chest pain or shortness of breath.  Syncope or presyncope ?He has had no falls.  He continues to be living independently and alone. ?Mostly he states he is stable and doing well. ?He sees cardiology yearly and is doing well and is stable from that standpoint.  He is taking his medications as prescribed. ?Follow up for Dizziness: ? ?The patient was last seen for this 1 months ago. ?Changes made at last visit include; seen by Mardene Speak. EKG and Labs were obtained.  ?He feels that condition is Unchanged. Patient states that his balance is still of and denies dizziness, he states that when he bends down he experiences symptom. Patient has been keeping track of blood pressure readings and states that systolic is ranging from 932-355 and diastolic >73 but <22.  ? ?----------------------------------------------------------------------------------------- ? ? ?Medications: ?Outpatient Medications Prior to Visit  ?Medication Sig  ? amLODipine (NORVASC) 5 MG tablet TAKE 1 TABLET EVERY EVENING  ? aspirin 81 MG tablet Take 81 mg by mouth daily.  ? Cholecalciferol (VITAMIN D3) 2000 units TABS Take 1 tablet by mouth daily.  ? co-enzyme Q-10 30 MG capsule Take 30 mg by mouth daily.  ? dorzolamide (TRUSOPT) 2 % ophthalmic solution Place 1 drop 2 (two) times daily into both eyes.   ? doxazosin (CARDURA) 2 MG tablet TAKE 1 TABLET EVERY DAY  ? fluticasone (FLONASE) 50 MCG/ACT nasal spray USE 2 SPRAYS IN EACH NOSTRIL EVERY DAY  ? ibuprofen (ADVIL,MOTRIN) 200 MG tablet Take 200 mg by  mouth daily as needed for moderate pain.  ? labetalol (NORMODYNE) 200 MG tablet TAKE 1 TABLET TWICE DAILY  ? latanoprost (XALATAN) 0.005 % ophthalmic solution 1 drop at bedtime.  ? loratadine (CLARITIN) 10 MG tablet Take 1 tablet (10 mg total) by mouth daily.  ? LORazepam (ATIVAN) 0.5 MG tablet TAKE 1 TABLET AT BEDTIME AS NEEDED FOR SLEEP  ? losartan-hydrochlorothiazide (HYZAAR) 100-25 MG tablet TAKE 1 TABLET EVERY DAY  ? meclizine (ANTIVERT) 25 MG tablet TAKE 1 TABLET DAILY AS NEEDED FOR DIZZINESS.  ? mometasone (ELOCON) 0.1 % cream APPLY TO ITCHY EARS TWICE DAILY FOR TWELVE DAYS. MAY REPEAT AS NEEDED FOR ITCHY EARS.  ? Omega-3 Fatty Acids (FISH OIL) 1000 MG CAPS Take by mouth daily.  ? pantoprazole (PROTONIX) 20 MG tablet TAKE 1 TABLET EVERY DAY  ? potassium chloride SA (KLOR-CON M) 20 MEQ tablet Take 1 tablet (20 mEq total) by mouth daily. PATIENT MUST KEEP APPOINTMENT FOR FUTURE REFILLS.  ? RHOPRESSA 0.02 % SOLN Apply 1 drop to eye at bedtime.  ? rosuvastatin (CRESTOR) 40 MG tablet TAKE 1 TABLET EVERY DAY  ? ?No facility-administered medications prior to visit.  ? ? ?Review of Systems  ?Constitutional:  Negative for appetite change, chills and fever.  ?Respiratory:  Negative for chest tightness, shortness of breath and wheezing.   ?Cardiovascular:  Negative for chest pain and palpitations.  ?Gastrointestinal:  Negative for abdominal pain, nausea and vomiting.  ? ?  Last lipids ?Lab Results  ?Component Value Date  ? CHOL 100 12/10/2021  ? HDL 47 12/10/2021  ? [place] 41 12/10/2021  ? TRIG 49 12/10/2021  ? CHOLHDL 2.1 12/10/2021  ? ?  ?  Objective  ?  ?BP 139/70   Pulse 70   Resp 16   Wt 193 lb 11.2 oz (87.9 kg)   SpO2 98%   BMI 29.45 kg/m?  ?BP Readings from Last 3 Encounters:  ?01/08/22 139/70  ?12/09/21 (!) 143/69  ?09/24/21 122/80  ? ?Wt Readings from Last 3 Encounters:  ?01/08/22 193 lb 11.2 oz (87.9 kg)  ?12/09/21 194 lb 9.6 oz (88.3 kg)  ?09/24/21 195 lb (88.5 kg)  ? ?  ? ?Physical Exam ?Vitals reviewed.   ?Constitutional:   ?   Appearance: He is well-developed.  ?HENT:  ?   Head: Normocephalic and atraumatic.  ?   Right Ear: External ear normal.  ?   Left Ear: External ear normal.  ?Eyes:  ?   General: No scleral icterus. ?   Conjunctiva/sclera: Conjunctivae normal.  ?Neck:  ?   Thyroid: No thyromegaly.  ?Cardiovascular:  ?   Rate and Rhythm: Normal rate and regular rhythm.  ?   Heart sounds: Normal heart sounds.  ?Pulmonary:  ?   Effort: Pulmonary effort is normal.  ?   Breath sounds: Normal breath sounds.  ?Abdominal:  ?   Palpations: Abdomen is soft.  ?Musculoskeletal:  ?   Comments: He has had a lipoma at the proximal portion of the left clavicle for years.  ?Skin: ?   General: Skin is warm and dry.  ?   Comments: Many SKs.  ?Neurological:  ?   General: No focal deficit present.  ?   Mental Status: He is alert and oriented to person, place, and time.  ?   Comments: Gait is is not very steady not unexpected at age 60  ?Psychiatric:     ?   Mood and Affect: Mood normal.     ?   Behavior: Behavior normal.     ?   Thought Content: Thought content normal.     ?   Judgment: Judgment normal.  ?  ? ? ?No results found for any visits on 01/08/22. ? Assessment & Plan  ?  ? ?1. Gait disturbance ?Refer to physical therapy ?- Ambulatory referral to Physical Therapy ? ?2. Atherosclerosis of coronary artery bypass graft of native heart without angina pectoris ?All risk factors treated ?Followed by cardiology yearly ?3. Essential hypertension ?Good control ? ?4. Pure hypercholesterolemia ?On rosuvastatin 40 ? ?5. Generalized anxiety disorder ?Uses regular lorazepam which I would like for him to get away from. ? ?6. Hearing loss of left ear, unspecified hearing loss type ? ? ? ?No follow-ups on file.  ?   ? ?I, [name], MD, have reviewed all documentation for this visit. The documentation on 01/10/22 for the exam, diagnosis, procedures, and orders are all accurate and complete. ? ? ?[employer] as a  Education administrator for [employer], MD.,have documented all relevant documentation on the behalf of [name], MD,as directed by  [name], MD while in the presence of [name], MD.  ? ?[name], MD  ?[facility] ?[phone] (phone) ?[phone] (fax) ? ?[facility] ?

## 2022-01-08 ENCOUNTER — Encounter: Payer: Self-pay | Admitting: Family Medicine

## 2022-01-08 ENCOUNTER — Ambulatory Visit (INDEPENDENT_AMBULATORY_CARE_PROVIDER_SITE_OTHER): Payer: Medicare HMO | Admitting: Family Medicine

## 2022-01-08 VITALS — BP 139/70 | HR 70 | Resp 16 | Wt 193.7 lb

## 2022-01-08 DIAGNOSIS — H9192 Unspecified hearing loss, left ear: Secondary | ICD-10-CM | POA: Diagnosis not present

## 2022-01-08 DIAGNOSIS — I10 Essential (primary) hypertension: Secondary | ICD-10-CM | POA: Diagnosis not present

## 2022-01-08 DIAGNOSIS — E7800 Pure hypercholesterolemia, unspecified: Secondary | ICD-10-CM

## 2022-01-08 DIAGNOSIS — R269 Unspecified abnormalities of gait and mobility: Secondary | ICD-10-CM

## 2022-01-08 DIAGNOSIS — I25810 Atherosclerosis of coronary artery bypass graft(s) without angina pectoris: Secondary | ICD-10-CM | POA: Diagnosis not present

## 2022-01-08 DIAGNOSIS — F411 Generalized anxiety disorder: Secondary | ICD-10-CM | POA: Diagnosis not present

## 2022-01-14 DIAGNOSIS — M6281 Muscle weakness (generalized): Secondary | ICD-10-CM | POA: Diagnosis not present

## 2022-01-14 DIAGNOSIS — R269 Unspecified abnormalities of gait and mobility: Secondary | ICD-10-CM | POA: Diagnosis not present

## 2022-01-16 DIAGNOSIS — M6281 Muscle weakness (generalized): Secondary | ICD-10-CM | POA: Diagnosis not present

## 2022-01-16 DIAGNOSIS — R269 Unspecified abnormalities of gait and mobility: Secondary | ICD-10-CM | POA: Diagnosis not present

## 2022-01-20 DIAGNOSIS — M6281 Muscle weakness (generalized): Secondary | ICD-10-CM | POA: Diagnosis not present

## 2022-01-20 DIAGNOSIS — R269 Unspecified abnormalities of gait and mobility: Secondary | ICD-10-CM | POA: Diagnosis not present

## 2022-01-23 DIAGNOSIS — M6281 Muscle weakness (generalized): Secondary | ICD-10-CM | POA: Diagnosis not present

## 2022-01-23 DIAGNOSIS — R269 Unspecified abnormalities of gait and mobility: Secondary | ICD-10-CM | POA: Diagnosis not present

## 2022-01-27 ENCOUNTER — Ambulatory Visit: Payer: Medicare HMO | Admitting: Family Medicine

## 2022-02-19 ENCOUNTER — Telehealth: Payer: Self-pay | Admitting: Family Medicine

## 2022-02-19 NOTE — Telephone Encounter (Signed)
Copied from [place] [id number]. Topic: Medicare AWV ?>> Feb 19, 2022 11:12 AM Cher Nakai R wrote: ?Reason for CRM:  ?Left message for patient to call back and schedule Medicare Annual Wellness Visit (AWV) in office.  ? ?If unable to come into the office for AWV,  please offer to do virtually or by telephone. ? ?Last AWV: 11/01/2020 ? ?Please schedule at anytime with [facility] Health Advisor. ? ?30 minute appointment for Virtual or phone ?45 minute appointment for in office or Initial virtual/phone ? ?Any questions, please contact me at [phone] ?

## 2022-02-25 ENCOUNTER — Ambulatory Visit (INDEPENDENT_AMBULATORY_CARE_PROVIDER_SITE_OTHER): Payer: Medicare HMO

## 2022-02-25 VITALS — Wt 193.0 lb

## 2022-02-25 DIAGNOSIS — Z0000 Encounter for general adult medical examination without abnormal findings: Secondary | ICD-10-CM | POA: Diagnosis not present

## 2022-02-25 NOTE — Patient Instructions (Signed)
[name] , ?Thank you for taking time to come for your Medicare Wellness Visit. I appreciate your ongoing commitment to your health goals. Please review the following plan we discussed and let me know if I can assist you in the future.  ? ?Screening recommendations/referrals: ?Colonoscopy: [facility]T 05/16/20 ?Recommended yearly ophthalmology/optometry visit for glaucoma screening and checkup ?Recommended yearly dental visit for hygiene and checkup ? ?Vaccinations: ?Influenza vaccine: n/d ?Pneumococcal vaccine: 04/11/14 ?Tdap vaccine: 06/12/15 ?Shingles vaccine: Shingrix 06/04/18, 08/10/18  Zostavax 02/05/11   ?Covid-19: 11/10/19, 12/07/19, 08/10/20 ? ?Advanced directives: no ? ?Conditions/risks identified: none ? ?Next appointment: Follow up in one year for your annual wellness visit. 03/02/23 @ 11:15am by phone ? ?Preventive Care 16 Years and Older, Male ?Preventive care refers to lifestyle choices and visits with your health care provider that can promote health and wellness. ?What does preventive care include? ?A yearly physical exam. This is also called an annual well check. ?Dental exams once or twice a year. ?Routine eye exams. Ask your health care provider how often you should have your eyes checked. ?Personal lifestyle choices, including: ?Daily care of your teeth and gums. ?Regular physical activity. ?Eating a healthy diet. ?Avoiding tobacco and drug use. ?Limiting alcohol use. ?Practicing safe sex. ?Taking low doses of aspirin every day. ?Taking vitamin and mineral supplements as recommended by your health care provider. ?What happens during an annual well check? ?The services and screenings done by your health care provider during your annual well check will depend on your age, overall health, lifestyle risk factors, and family history of disease. ?Counseling  ?Your health care provider may ask you questions about your: ?Alcohol use. ?Tobacco use. ?Drug use. ?Emotional well-being. ?Home and relationship  well-being. ?Sexual activity. ?Eating habits. ?History of falls. ?Memory and ability to understand (cognition). ?Work and work Statistician. ?Screening  ?You may have the following tests or measurements: ?Height, weight, and BMI. ?Blood pressure. ?Lipid and cholesterol levels. These may be checked every 5 years, or more frequently if you are over 39 years old. ?Skin check. ?Lung cancer screening. You may have this screening every year starting at age 26 if you have a 30-pack-year history of smoking and currently smoke or have quit within the past 15 years. ?Fecal occult blood test (FOBT) of the stool. You may have this test every year starting at age 34. ?Flexible sigmoidoscopy or colonoscopy. You may have a sigmoidoscopy every 5 years or a colonoscopy every 10 years starting at age 20. ?Prostate cancer screening. Recommendations will vary depending on your family history and other risks. ?Hepatitis C blood test. ?Hepatitis B blood test. ?Sexually transmitted disease (STD) testing. ?Diabetes screening. This is done by checking your blood sugar (glucose) after you have not eaten for a while (fasting). You may have this done every 1-3 years. ?Abdominal aortic aneurysm (AAA) screening. You may need this if you are a current or former smoker. ?Osteoporosis. You may be screened starting at age 37 if you are at high risk. ?Talk with your health care provider about your test results, treatment options, and if necessary, the need for more tests. ?Vaccines  ?Your health care provider may recommend certain vaccines, such as: ?Influenza vaccine. This is recommended every year. ?Tetanus, diphtheria, and acellular pertussis (Tdap, Td) vaccine. You may need a Td booster every 10 years. ?Zoster vaccine. You may need this after age 81. ?Pneumococcal 13-valent conjugate (PCV13) vaccine. One dose is recommended after age 18. ?Pneumococcal polysaccharide (PPSV23) vaccine. One dose is recommended after age 18. ?  Talk to your health care  provider about which screenings and vaccines you need and how often you need them. ?This information is not intended to replace advice given to you by your health care provider. Make sure you discuss any questions you have with your health care provider. ?Document Released: 10/26/2015 Document Revised: 06/18/2016 Document Reviewed: 07/31/2015 ?Elsevier Interactive Patient Education ? 2017 [place]. ? ?Fall Prevention in the Home ?Falls can cause injuries. They can happen to people of all ages. There are many things you can do to make your home safe and to help prevent falls. ?What can I do on the outside of my home? ?Regularly fix the edges of walkways and driveways and fix any cracks. ?Remove anything that might make you trip as you walk through a door, such as a raised step or threshold. ?Trim any bushes or trees on the path to your home. ?Use bright outdoor lighting. ?Clear any walking paths of anything that might make someone trip, such as rocks or tools. ?Regularly check to see if handrails are loose or broken. Make sure that both sides of any steps have handrails. ?Any raised decks and porches should have guardrails on the edges. ?Have any leaves, snow, or ice cleared regularly. ?Use sand or salt on walking paths during winter. ?Clean up any spills in your garage right away. This includes oil or grease spills. ?What can I do in the bathroom? ?Use night lights. ?Install grab bars by the toilet and in the tub and shower. Do not use towel bars as grab bars. ?Use non-skid mats or decals in the tub or shower. ?If you need to sit down in the shower, use a plastic, non-slip stool. ?Keep the floor dry. Clean up any water that spills on the floor as soon as it happens. ?Remove soap buildup in the tub or shower regularly. ?Attach bath mats securely with double-sided non-slip rug tape. ?Do not have throw rugs and other things on the floor that can make you trip. ?What can I do in the bedroom? ?Use night lights. ?Make  sure that you have a light by your bed that is easy to reach. ?Do not use any sheets or blankets that are too big for your bed. They should not hang down onto the floor. ?Have a firm chair that has side arms. You can use this for support while you get dressed. ?Do not have throw rugs and other things on the floor that can make you trip. ?What can I do in the kitchen? ?Clean up any spills right away. ?Avoid walking on wet floors. ?Keep items that you use a lot in easy-to-reach places. ?If you need to reach something above you, use a strong step stool that has a grab bar. ?Keep electrical cords out of the way. ?Do not use floor polish or wax that makes floors slippery. If you must use wax, use non-skid floor wax. ?Do not have throw rugs and other things on the floor that can make you trip. ?What can I do with my stairs? ?Do not leave any items on the stairs. ?Make sure that there are handrails on both sides of the stairs and use them. Fix handrails that are broken or loose. Make sure that handrails are as long as the stairways. ?Check any carpeting to make sure that it is firmly attached to the stairs. Fix any carpet that is loose or worn. ?Avoid having throw rugs at the top or bottom of the stairs. If you do have throw  rugs, attach them to the floor with carpet tape. ?Make sure that you have a light switch at the top of the stairs and the bottom of the stairs. If you do not have them, ask someone to add them for you. ?What else can I do to help prevent falls? ?Wear shoes that: ?Do not have high heels. ?Have rubber bottoms. ?Are comfortable and fit you well. ?Are closed at the toe. Do not wear sandals. ?If you use a stepladder: ?Make sure that it is fully opened. Do not climb a closed stepladder. ?Make sure that both sides of the stepladder are locked into place. ?Ask someone to hold it for you, if possible. ?Clearly mark and make sure that you can see: ?Any grab bars or handrails. ?First and last steps. ?Where the  edge of each step is. ?Use tools that help you move around (mobility aids) if they are needed. These include: ?Canes. ?Walkers. ?Scooters. ?Crutches. ?Turn on the lights when you go into a dark area. Replace any lig

## 2022-02-25 NOTE — Progress Notes (Signed)
Virtual Visit via Telephone Note  I connected with  [name] on 02/25/22 at 11:00 AM EDT by telephone and verified that I am speaking with the correct person using two identifiers.  Location: Patient: home Provider: [facility] Persons participating in the virtual visit: [place]   I discussed the limitations, risks, security and privacy concerns of performing an evaluation and management service by telephone and the availability of in person appointments. The patient expressed understanding and agreed to proceed.  Interactive audio and video telecommunications were attempted between this nurse and patient, however failed, due to patient having technical difficulties OR patient did not have access to video capability.  We continued and completed visit with audio only.  Some vital signs may be absent or patient reported.   Dionisio David, LPN  Subjective:   [name] is a 88 y.o. male who presents for Medicare Annual/Subsequent preventive examination.  Review of Systems           Objective:    There were no vitals filed for this visit. There is no height or weight on file to calculate BMI.     08/07/2020    1:44 AM 09/20/2019    2:47 PM 12/02/2018    8:58 AM 09/15/2018    3:00 PM 09/01/2017    9:22 AM 08/06/2016    9:52 AM 08/06/2016    9:01 AM  Advanced Directives  Does Patient Have a Medical Advance Directive? No Yes Yes Yes Yes Yes Yes  Type of Social research officer, government;Living will Living will;Healthcare Power of [place];Living will Living will  [place];Living will  Copy of [place] in Chart?  No - copy requested  No - copy requested   No - copy requested    Current Medications (verified) Outpatient Encounter Medications as of 02/25/2022  Medication Sig   amLODipine (NORVASC) 5 MG tablet TAKE 1 TABLET EVERY EVENING   aspirin 81 MG tablet Take 81 mg by mouth  daily.   Cholecalciferol (VITAMIN D3) 2000 units TABS Take 1 tablet by mouth daily.   co-enzyme Q-10 30 MG capsule Take 30 mg by mouth daily.   dorzolamide (TRUSOPT) 2 % ophthalmic solution Place 1 drop 2 (two) times daily into both eyes.    doxazosin (CARDURA) 2 MG tablet TAKE 1 TABLET EVERY DAY   fluticasone (FLONASE) 50 MCG/ACT nasal spray USE 2 SPRAYS IN EACH NOSTRIL EVERY DAY   ibuprofen (ADVIL,MOTRIN) 200 MG tablet Take 200 mg by mouth daily as needed for moderate pain.   labetalol (NORMODYNE) 200 MG tablet TAKE 1 TABLET TWICE DAILY   latanoprost (XALATAN) 0.005 % ophthalmic solution 1 drop at bedtime.   loratadine (CLARITIN) 10 MG tablet Take 1 tablet (10 mg total) by mouth daily.   LORazepam (ATIVAN) 0.5 MG tablet TAKE 1 TABLET AT BEDTIME AS NEEDED FOR SLEEP   losartan-hydrochlorothiazide (HYZAAR) 100-25 MG tablet TAKE 1 TABLET EVERY DAY   meclizine (ANTIVERT) 25 MG tablet TAKE 1 TABLET DAILY AS NEEDED FOR DIZZINESS.   mometasone (ELOCON) 0.1 % cream APPLY TO ITCHY EARS TWICE DAILY FOR TWELVE DAYS. MAY REPEAT AS NEEDED FOR ITCHY EARS.   Omega-3 Fatty Acids (FISH OIL) 1000 MG CAPS Take by mouth daily.   pantoprazole (PROTONIX) 20 MG tablet TAKE 1 TABLET EVERY DAY   potassium chloride SA (KLOR-CON M) 20 MEQ tablet Take 1 tablet (20 mEq total) by mouth daily. PATIENT MUST KEEP APPOINTMENT  FOR FUTURE REFILLS.   RHOPRESSA 0.02 % SOLN Apply 1 drop to eye at bedtime.   rosuvastatin (CRESTOR) 40 MG tablet TAKE 1 TABLET EVERY DAY   No facility-administered encounter medications on file as of 02/25/2022.    Allergies (verified) Patient has no known allergies.   History: Past Medical History:  Diagnosis Date   Anemia, unspecified    Anxiety state, unspecified    Cancer ([place])    skin   Coronary atherosclerosis    GERD (gastroesophageal reflux disease)    Osteoarthritis    group B streptococcal septic arthritis   Unspecified essential hypertension    No past surgical history on  file. Family History  Problem Relation Age of Onset   Parkinson's disease Mother    Heart disease Mother    Dementia Mother    Cancer Father        lung cancer, smoker   Depression Sister    Fibromyalgia Sister    Arthritis Brother        B/L knee replacement   Sleep apnea Brother    Social History   Socioeconomic History   Marital status: Divorced    Spouse name: Not on file   Number of children: 3   Years of education: Not on file   Highest education level: Some college, no degree  Occupational History   Occupation: retired  Tobacco Use   Smoking status: Never   Smokeless tobacco: Never  Scientific laboratory technician Use: Never used  Substance and Sexual Activity   Alcohol use: Not Currently    Comment: drinks a beer occasionally   Drug use: No   Sexual activity: Not on file  Other Topics Concern   Not on file  Social History Narrative   Not on file   Social Determinants of Health   Financial Resource Strain: Not on file  Food Insecurity: Not on file  Transportation Needs: Not on file  Physical Activity: Not on file  Stress: Not on file  Social Connections: Not on file    Tobacco Counseling Counseling given: Not Answered   Clinical Intake:  Pre-visit preparation completed: Yes  Pain : No/denies pain     Diabetes: No  How often do you need to have someone help you when you read instructions, pamphlets, or other written materials from your doctor or pharmacy?: 1 - Never  Diabetic?no  Interpreter Needed?: No  Information entered by :: Kirke Shaggy, LPN   Activities of Daily Living    12/09/2021    3:45 PM  In your present state of health, do you have any difficulty performing the following activities:  Hearing? 1  Vision? 0  Difficulty concentrating or making decisions? 0  Walking or climbing stairs? 0  Dressing or bathing? 0  Doing errands, shopping? 0    Patient Care Team: Jerrol Banana., MD as PCP - General (Unknown Physician  Specialty) Stanford Breed Denice Bors, MD (Cardiology) Lorelee Cover., MD as Consulting Physician (Ophthalmology) Beverly Gust, MD (Otolaryngology) Dasher, Rayvon Char, MD (Dermatology)  Indicate any recent Medical Services you may have received from other than [facility] providers in the past year (date may be approximate).     Assessment:   This is a routine wellness examination for [name].  Hearing/Vision screen No results found.  Dietary issues and exercise activities discussed:     Goals Addressed   None    Depression Screen    12/09/2021    3:44 PM 11/01/2020    9:15  AM 09/17/2020   10:16 AM 09/28/2019    2:09 PM 09/20/2019    2:53 PM 09/15/2018    3:05 PM 09/15/2018    3:01 PM  PHQ 2/9 Scores  PHQ - 2 Score '2 1 1 1 '$ 0 1 1  PHQ- 9 Score '6 1 1 5  4     '$ Fall Risk    12/09/2021    3:45 PM 11/01/2020    9:14 AM 09/17/2020   10:16 AM 09/28/2019    2:09 PM 09/20/2019    2:48 PM  [place] in the past year? 0 0 0 0 0  Number falls in past yr: 0 0 0 0 0  Injury with Fall? 0 0 0 0 1  Risk for fall due to :  No Fall Risks   Other (Comment)  Risk for fall due to: Comment     vertigo  Follow up  Falls evaluation completed Falls evaluation completed Falls evaluation completed Falls prevention discussed    [place]:  Any stairs in or around the home? No  If so, are there any without handrails? No  Home free of loose throw rugs in walkways, pet beds, electrical cords, etc? Yes  Adequate lighting in your home to reduce risk of falls? Yes   ASSISTIVE DEVICES UTILIZED TO PREVENT FALLS:  Life alert? No  Use of a cane, walker or w/c? Yes  Grab bars in the bathroom? No  Shower chair or bench in shower? No  Elevated toilet seat or a handicapped toilet? No   Cognitive Function:0 points 6CIT        11/01/2020    9:15 AM 09/20/2019    2:56 PM 09/15/2018    3:05 PM 09/01/2017    9:29 AM 08/06/2016    9:07 AM  6CIT Screen  What Year? 0 points 0  points 0 points 0 points 0 points  What month? 0 points 0 points 0 points 0 points 0 points  What time? 0 points 0 points 0 points 0 points 0 points  Count back from 20 0 points 0 points 0 points 0 points 0 points  Months in reverse 0 points 0 points 0 points 0 points 0 points  Repeat phrase 0 points 0 points 0 points 2 points 0 points  Total Score 0 points 0 points 0 points 2 points 0 points    Immunizations Immunization History  Administered Date(s) Administered   Fluad Quad(high Dose 65+) 08/02/2021   Influenza, High Dose Seasonal PF 08/09/2015, 07/14/2016, 07/23/2017, 09/08/2018, 07/24/2020   Influenza,inj,Quad PF,6+ Mos 07/15/2019   Influenza-Unspecified 07/13/2013   Moderna Sars-Covid-2 Vaccination 11/10/2019, 12/07/2019, 08/10/2020   Pneumococcal Conjugate-13 04/11/2014   Pneumococcal Polysaccharide-23 08/19/2006   Td 05/01/2005   Tdap 06/12/2015   Zoster Recombinat (Shingrix) 06/04/2018, 08/10/2018   Zoster, Live 02/05/2011    TDAP status: Up to date  Flu Vaccine status: Up to date  Pneumococcal vaccine status: Up to date  Covid-19 vaccine status: Completed vaccines  Qualifies for Shingles Vaccine? Yes   Zostavax completed Yes   Shingrix Completed?: Yes  Screening Tests Health Maintenance  Topic Date Due   COVID-19 Vaccine (4 - Booster for Moderna series) 10/05/2020   INFLUENZA VACCINE  05/13/2022   TETANUS/TDAP  06/11/2025   Pneumonia Vaccine 30+ Years old  Completed   Zoster Vaccines- Shingrix  Completed   HPV VACCINES  Aged Out    Health Maintenance  Health Maintenance Due  Topic Date  Due   COVID-19 Vaccine (4 - Booster for Moderna series) 10/05/2020    Colorectal cancer screening: Type of screening: FOBT/FIT. Completed 05/16/20. Repeat every 1 years  Lung Cancer Screening: (Low Dose CT Chest recommended if Age 79-80 years, 30 pack-year currently smoking OR have quit w/in 15years.) does not qualify.   Additional Screening:  Hepatitis C Screening:  does not qualify; Completed no  Vision Screening: Recommended annual ophthalmology exams for early detection of glaucoma and other disorders of the eye. Is the patient up to date with their annual eye exam?  Yes  Who is the provider or what is the name of the office in which the patient attends annual eye exams? Dr.Bell If pt is not established with a provider, would they like to be referred to a provider to establish care? No .   Dental Screening: Recommended annual dental exams for proper oral hygiene  Community Resource Referral / Chronic Care Management: CRR required this visit?  No   CCM required this visit?  No      Plan:     I have personally reviewed and noted the following in the patient's chart:   Medical and social history Use of alcohol, tobacco or illicit drugs  Current medications and supplements including opioid prescriptions. Patient is not currently taking opioid prescriptions. Functional ability and status Nutritional status Physical activity Advanced directives List of other physicians Hospitalizations, surgeries, and ER visits in previous 12 months Vitals Screenings to include cognitive, depression, and falls Referrals and appointments  In addition, I have reviewed and discussed with patient certain preventive protocols, quality metrics, and best practice recommendations. A written personalized care plan for preventive services as well as general preventive health recommendations were provided to patient.     Dionisio David, LPN   04/14/5008   Nurse Notes: none

## 2022-03-03 ENCOUNTER — Encounter: Payer: Medicare HMO | Admitting: Family Medicine

## 2022-03-10 ENCOUNTER — Other Ambulatory Visit: Payer: Self-pay | Admitting: Cardiology

## 2022-03-10 DIAGNOSIS — I25810 Atherosclerosis of coronary artery bypass graft(s) without angina pectoris: Secondary | ICD-10-CM

## 2022-03-24 ENCOUNTER — Other Ambulatory Visit: Payer: Self-pay | Admitting: Family Medicine

## 2022-03-24 DIAGNOSIS — I10 Essential (primary) hypertension: Secondary | ICD-10-CM

## 2022-03-24 DIAGNOSIS — G4700 Insomnia, unspecified: Secondary | ICD-10-CM

## 2022-04-14 DIAGNOSIS — L0889 Other specified local infections of the skin and subcutaneous tissue: Secondary | ICD-10-CM | POA: Diagnosis not present

## 2022-04-14 DIAGNOSIS — D2261 Melanocytic nevi of right upper limb, including shoulder: Secondary | ICD-10-CM | POA: Diagnosis not present

## 2022-04-14 DIAGNOSIS — D485 Neoplasm of uncertain behavior of skin: Secondary | ICD-10-CM | POA: Diagnosis not present

## 2022-04-14 DIAGNOSIS — B078 Other viral warts: Secondary | ICD-10-CM | POA: Diagnosis not present

## 2022-04-14 DIAGNOSIS — R238 Other skin changes: Secondary | ICD-10-CM | POA: Diagnosis not present

## 2022-04-14 DIAGNOSIS — L538 Other specified erythematous conditions: Secondary | ICD-10-CM | POA: Diagnosis not present

## 2022-04-14 DIAGNOSIS — D225 Melanocytic nevi of trunk: Secondary | ICD-10-CM | POA: Diagnosis not present

## 2022-04-14 DIAGNOSIS — D2271 Melanocytic nevi of right lower limb, including hip: Secondary | ICD-10-CM | POA: Diagnosis not present

## 2022-04-14 DIAGNOSIS — D2262 Melanocytic nevi of left upper limb, including shoulder: Secondary | ICD-10-CM | POA: Diagnosis not present

## 2022-04-14 DIAGNOSIS — D044 Carcinoma in situ of skin of scalp and neck: Secondary | ICD-10-CM | POA: Diagnosis not present

## 2022-04-17 DIAGNOSIS — H40153 Residual stage of open-angle glaucoma, bilateral: Secondary | ICD-10-CM | POA: Diagnosis not present

## 2022-04-22 ENCOUNTER — Other Ambulatory Visit: Payer: Self-pay | Admitting: Cardiology

## 2022-04-22 DIAGNOSIS — I25810 Atherosclerosis of coronary artery bypass graft(s) without angina pectoris: Secondary | ICD-10-CM

## 2022-05-01 DIAGNOSIS — D044 Carcinoma in situ of skin of scalp and neck: Secondary | ICD-10-CM | POA: Diagnosis not present

## 2022-05-05 DIAGNOSIS — E7800 Pure hypercholesterolemia, unspecified: Secondary | ICD-10-CM | POA: Diagnosis not present

## 2022-05-05 DIAGNOSIS — I10 Essential (primary) hypertension: Secondary | ICD-10-CM | POA: Diagnosis not present

## 2022-05-05 DIAGNOSIS — I25810 Atherosclerosis of coronary artery bypass graft(s) without angina pectoris: Secondary | ICD-10-CM | POA: Diagnosis not present

## 2022-05-21 ENCOUNTER — Other Ambulatory Visit: Payer: Self-pay | Admitting: Family Medicine

## 2022-05-21 DIAGNOSIS — R42 Dizziness and giddiness: Secondary | ICD-10-CM

## 2022-05-21 NOTE — Telephone Encounter (Signed)
Requested Prescriptions  Pending Prescriptions Disp Refills  . fluticasone (FLONASE) 50 MCG/ACT nasal spray [Pharmacy Med Name: FLUTICASONE PROPIONATE 50 MCG/ACT Suspension] 48 g 1    Sig: USE 2 SPRAYS IN EACH NOSTRIL EVERY DAY     Ear, Nose, and Throat: Nasal Preparations - Corticosteroids Passed - 05/21/2022 12:11 PM      Passed - Valid encounter within last 12 months    Recent Outpatient Visits          4 months ago Gait disturbance   [facility] Jerrol Banana., MD   5 months ago [place], [place], PA-C   7 months ago Epidermoid cyst of skin of back   [employer], Dani Gobble, PA-C   1 year ago Arteriosclerosis of coronary artery   [facility] Jerrol Banana., MD   1 year ago Encounter for [employer] annual wellness exam   [facility] Jerrol Banana., MD      Future Appointments            In 1 month Jerrol Banana., MD [facility], [place]

## 2022-06-23 DIAGNOSIS — S76011A Strain of muscle, fascia and tendon of right hip, initial encounter: Secondary | ICD-10-CM | POA: Diagnosis not present

## 2022-06-25 ENCOUNTER — Ambulatory Visit: Payer: Self-pay

## 2022-06-25 NOTE — Telephone Encounter (Signed)
Patient called, left VM to return the call to the office to speak to a nurse.   Summary: frequent urination   Patients daughter called in, says patient is having frequent urination , says pt was dx with pulled muscle in buttocks, and thinks that maybe causing a Uti with frequent urination and wants to bring in a urine sample. She didn't want to make an appt

## 2022-06-25 NOTE — Telephone Encounter (Signed)
    Chief Complaint: Urinary frequency, increased thirst. Daughter is asking if they can bring in a urine specimen. He has had a recent injury and has mobility issues.States "Orene Desanctis will bring it in."  Symptoms: Above Frequency: 1 week ago Pertinent Negatives: Patient denies any pain Disposition: '[]'$ ED /'[]'$ Urgent Care (no appt availability in office) / '[]'$ Appointment(In office/virtual)/ '[]'$  [facility]/ '[]'$ Home Care/ '[]'$ Refused Recommended Disposition /'[]'$ [place] [facility]/ '[x]'$  Follow-up with PCP Additional Notes: Please advise daughter, Arby Barrette.  Answer Assessment - Initial Assessment Questions 1. SYMPTOM: "What's the main symptom you're concerned about?" (e.g., frequency, incontinence)     Frequency 2. ONSET: "When did the    start?"     1 week ago 3. PAIN: "Is there any pain?" If Yes, ask: "How bad is it?" (Scale: 1-10; mild, moderate, severe)     No 4. CAUSE: "What do you think is causing the symptoms?"     Unsure 5. OTHER SYMPTOMS: "Do you have any other symptoms?" (e.g., blood in urine, fever, flank pain, pain with urination)     Thirsty 6. PREGNANCY: "Is there any chance you are pregnant?" "When was your last menstrual period?"     N/a  Protocols used: Urinary Symptoms-A-AH

## 2022-06-25 NOTE — Telephone Encounter (Signed)
Patient's daughter came by office and was informed to schedule ov.

## 2022-06-26 ENCOUNTER — Ambulatory Visit (INDEPENDENT_AMBULATORY_CARE_PROVIDER_SITE_OTHER): Payer: Medicare HMO | Admitting: Physician Assistant

## 2022-06-26 ENCOUNTER — Encounter: Payer: Self-pay | Admitting: Physician Assistant

## 2022-06-26 VITALS — BP 83/47 | HR 75 | Ht 66.0 in | Wt 172.9 lb

## 2022-06-26 DIAGNOSIS — N3001 Acute cystitis with hematuria: Secondary | ICD-10-CM | POA: Diagnosis not present

## 2022-06-26 DIAGNOSIS — R269 Unspecified abnormalities of gait and mobility: Secondary | ICD-10-CM

## 2022-06-26 DIAGNOSIS — S76011A Strain of muscle, fascia and tendon of right hip, initial encounter: Secondary | ICD-10-CM | POA: Diagnosis not present

## 2022-06-26 DIAGNOSIS — I959 Hypotension, unspecified: Secondary | ICD-10-CM | POA: Diagnosis not present

## 2022-06-26 DIAGNOSIS — R350 Frequency of micturition: Secondary | ICD-10-CM

## 2022-06-26 LAB — POCT URINALYSIS DIPSTICK
Bilirubin, UA: NEGATIVE
Glucose, UA: NEGATIVE
Ketones, UA: NEGATIVE
Leukocytes, UA: NEGATIVE
Nitrite, UA: NEGATIVE
Protein, UA: POSITIVE — AB
Spec Grav, UA: 1.02 (ref 1.010–1.025)
Urobilinogen, UA: 0.2 E.U./dL
pH, UA: 6 (ref 5.0–8.0)

## 2022-06-26 MED ORDER — SULFAMETHOXAZOLE-TRIMETHOPRIM 800-160 MG PO TABS: 1.0000 | ORAL_TABLET | Freq: Two times a day (BID) | ORAL | 0 refills | Status: AC

## 2022-06-26 NOTE — Assessment & Plan Note (Addendum)
Ref to pt -- home health for improvement in strength, mobility, balance. Currently ambulates with cane. High fall risk

## 2022-06-26 NOTE — Patient Instructions (Signed)
Stop losartan 100 mg hctz 25 mg for the next 3-4 days. Blood pressure goal is 120s-130s / 70s-80s

## 2022-06-26 NOTE — Assessment & Plan Note (Signed)
Reviewed note from [facility] all xrays negative Ref to home pt

## 2022-06-26 NOTE — Assessment & Plan Note (Signed)
D/c losartan hctz for 3-4 days. Advised pt f/u in office Monday to repeat bp  Advised pt and family check bp throughout the weekend Okay to restart losartan hctz if bp >130s/90s. Pt and family aware of recommendations Any questions/concerns please call office

## 2022-06-26 NOTE — Progress Notes (Signed)
I,Sha'taria Tyson,acting as a Education administrator for [employer], PA-C.,have documented all relevant documentation on the behalf of Mikey Kirschner, PA-C,as directed by  Mikey Kirschner, PA-C while in the presence of Mikey Kirschner, PA-C.   Established patient visit   Patient: [name]   DOB: Aug 23, 1933   88 y.o. Male  MRN: [id number] Visit Date: 06/26/2022  Today's healthcare provider: Mikey Kirschner, PA-C   Cc. Urinary frequency   Subjective    HPI  Pt presents today with his son and sister. Urinary symptoms  He reports new onset urinary urgency associated with excessive thirst and no appetite. The current episode started about a week ago and is staying constant. Patient states symptoms are occurring every 2 hours. He  has not been recently treated for similar symptoms.    Associated symptoms: No abdominal pain Yes back pain  No chills No constipation  No cramping No diarrhea  No discharge No fever  No hematuria No nausea  No vomiting    Pt had a recent mechanical fall 9/7 missed a step and landed hard on right foot/leg but still standing did not fall. [facility] seen at [facility], xrays all negative. Reports residual R hip and R sided back pain. Questions about physical therapy to improve balance and mobility.  ---------------------------------------------------------------------------------------    Medications: Outpatient Medications Prior to Visit  Medication Sig   amLODipine (NORVASC) 5 MG tablet TAKE 1 TABLET EVERY EVENING   aspirin 81 MG tablet Take 81 mg by mouth daily.   Cholecalciferol (VITAMIN D3) 2000 units TABS Take 1 tablet by mouth daily.   co-enzyme Q-10 30 MG capsule Take 30 mg by mouth daily.   dorzolamide (TRUSOPT) 2 % ophthalmic solution Place 1 drop 2 (two) times daily into both eyes.    doxazosin (CARDURA) 2 MG tablet TAKE 1 TABLET EVERY DAY   fluticasone (FLONASE) 50 MCG/ACT nasal spray USE 2 SPRAYS IN EACH NOSTRIL EVERY DAY   ibuprofen (ADVIL,MOTRIN) 200  MG tablet Take 200 mg by mouth daily as needed for moderate pain.   labetalol (NORMODYNE) 200 MG tablet TAKE 1 TABLET TWICE DAILY   latanoprost (XALATAN) 0.005 % ophthalmic solution 1 drop at bedtime.   loratadine (CLARITIN) 10 MG tablet Take 1 tablet (10 mg total) by mouth daily.   LORazepam (ATIVAN) 0.5 MG tablet TAKE 1 TABLET AT BEDTIME AS NEEDED FOR SLEEP   losartan-hydrochlorothiazide (HYZAAR) 100-25 MG tablet TAKE 1 TABLET EVERY DAY   meclizine (ANTIVERT) 25 MG tablet TAKE 1 TABLET DAILY AS NEEDED FOR DIZZINESS.   mometasone (ELOCON) 0.1 % cream APPLY TO ITCHY EARS TWICE DAILY FOR TWELVE DAYS. MAY REPEAT AS NEEDED FOR ITCHY EARS.   Omega-3 Fatty Acids (FISH OIL) 1000 MG CAPS Take by mouth daily.   pantoprazole (PROTONIX) 20 MG tablet TAKE 1 TABLET EVERY DAY   potassium chloride SA (KLOR-CON M) 20 MEQ tablet Take 1 tablet (20 mEq total) by mouth daily. PATIENT MUST KEEP APPOINTMENT FOR FUTURE REFILLS.   RHOPRESSA 0.02 % SOLN Apply 1 drop to eye at bedtime.   rosuvastatin (CRESTOR) 40 MG tablet Take 1 tablet (40 mg total) by mouth daily. SCHEDULE OFFICE VISIT FOR FUTURE REFILLS.   No facility-administered medications prior to visit.    Review of Systems  Constitutional:  Negative for fatigue and fever.  Respiratory:  Negative for cough and shortness of breath.   Cardiovascular:  Negative for chest pain, palpitations and leg swelling.  Genitourinary:  Positive for frequency and urgency. Negative for  difficulty urinating, dysuria, flank pain and hematuria.  Neurological:  Positive for weakness. Negative for dizziness and headaches.      Objective    BP (!) 83/47 (BP Location: Left Arm, Patient Position: Sitting, Cuff Size: Normal)   Pulse 75   Ht '5\' 6"'$  (1.676 m)   Wt 172 lb 14.4 oz (78.4 kg)   SpO2 98%   BMI 27.91 kg/m  BP Readings from Last 3 Encounters:  06/26/22 (!) 83/47  01/08/22 139/70  12/09/21 (!) 143/69   Wt Readings from Last 3 Encounters:  06/26/22 172 lb 14.4 oz  (78.4 kg)  02/25/22 193 lb (87.5 kg)  01/08/22 193 lb 11.2 oz (87.9 kg)  Physical Exam Constitutional:      General: He is not in acute distress.    Appearance: He is not ill-appearing.  Eyes:     Conjunctiva/sclera: Conjunctivae normal.  Cardiovascular:     Rate and Rhythm: Normal rate and regular rhythm.     Heart sounds: No murmur heard. Pulmonary:     Effort: Pulmonary effort is normal. No respiratory distress.     Breath sounds: Normal breath sounds.  Musculoskeletal:     Right lower leg: No edema.     Left lower leg: No edema.  Neurological:     General: No focal deficit present.     Mental Status: He is oriented to person, place, and time.      Problem List Items Addressed This Visit       Cardiovascular and Mediastinum   Hypotension    D/c losartan hctz for 3-4 days. Advised pt f/u in office Monday to repeat bp  Advised pt and family check bp throughout the weekend Okay to restart losartan hctz if bp >130s/90s. Pt and family aware of recommendations Any questions/concerns please call office        Musculoskeletal and Integument   Muscle strain of right gluteal region    Reviewed note from [facility] all xrays negative Ref to home pt       Relevant Orders   Ambulatory referral to [place]     Other   Gait disturbance    Ref to pt -- home health for improvement in strength, mobility, balance. Currently ambulates with cane. High fall risk      Relevant Orders   Ambulatory referral to [place]   Other Visit Diagnoses     Acute cystitis with hematuria    -  Primary   Relevant Medications   sulfamethoxazole-trimethoprim (BACTRIM DS) 800-160 MG tablet   Other Relevant Orders   Urine Culture   Comprehensive Metabolic Panel (CMET)   CBC w/Diff/Platelet   Frequency of urination       Relevant Orders   POCT Urinalysis Dipstick (Completed)       Acute cystitis Sent for culture. Rx bactrim ds pobid x 5 days will contact family if needs to be  changed or stopped Will check cmp cbc  Pt was also evaluated today by PCP, Dr Miguel Aschoff Return in about 4 days (around 06/30/2022) for hypotension.      I, Mikey Kirschner, PA-C have reviewed all documentation for this visit. The documentation on  06/26/2022 for the exam, diagnosis, procedures, and orders are all accurate and complete.Mikey Kirschner, PA-C [facility] [address] [place], [place], [address] Office: [phone] Fax: [place]

## 2022-06-27 ENCOUNTER — Ambulatory Visit: Payer: Medicare HMO | Admitting: Physician Assistant

## 2022-06-27 LAB — COMPREHENSIVE METABOLIC PANEL
ALT: 132 IU/L — ABNORMAL HIGH (ref 0–44)
AST: 60 IU/L — ABNORMAL HIGH (ref 0–40)
Albumin/Globulin Ratio: 1.2 (ref 1.2–2.2)
Albumin: 3.3 g/dL — ABNORMAL LOW (ref 3.7–4.7)
Alkaline Phosphatase: 266 IU/L — ABNORMAL HIGH (ref 44–121)
BUN/Creatinine Ratio: 33 — ABNORMAL HIGH (ref 10–24)
BUN: 83 mg/dL (ref 8–27)
Bilirubin Total: 0.8 mg/dL (ref 0.0–1.2)
CO2: 18 mmol/L — ABNORMAL LOW (ref 20–29)
Calcium: 9.3 mg/dL (ref 8.6–10.2)
Chloride: 101 mmol/L (ref 96–106)
Creatinine, Ser: 2.51 mg/dL — ABNORMAL HIGH (ref 0.76–1.27)
Globulin, Total: 2.7 g/dL (ref 1.5–4.5)
Glucose: 117 mg/dL — ABNORMAL HIGH (ref 70–99)
Potassium: 3.5 mmol/L (ref 3.5–5.2)
Sodium: 139 mmol/L (ref 134–144)
Total Protein: 6 g/dL (ref 6.0–8.5)
eGFR: 24 mL/min/{1.73_m2} — ABNORMAL LOW (ref >59)

## 2022-06-27 LAB — CBC WITH DIFFERENTIAL/PLATELET
Basophils Absolute: 0 10*3/uL (ref 0.0–0.2)
Basos: 0 %
EOS (ABSOLUTE): 0 10*3/uL (ref 0.0–0.4)
Eos: 0 %
Hematocrit: 39.2 % (ref 37.5–51.0)
Hemoglobin: 13.6 g/dL (ref 13.0–17.7)
Immature Grans (Abs): 0.2 10*3/uL — ABNORMAL HIGH (ref 0.0–0.1)
Immature Granulocytes: 2 %
Lymphocytes Absolute: 0.4 10*3/uL — ABNORMAL LOW (ref 0.7–3.1)
Lymphs: 5 %
MCH: 31 pg (ref 26.6–33.0)
MCHC: 34.7 g/dL (ref 31.5–35.7)
MCV: 89 fL (ref 79–97)
Monocytes Absolute: 1 10*3/uL — ABNORMAL HIGH (ref 0.1–0.9)
Monocytes: 12 %
Neutrophils Absolute: 6.8 10*3/uL (ref 1.4–7.0)
Neutrophils: 81 %
Platelets: 351 10*3/uL (ref 150–450)
RBC: 4.39 x10E6/uL (ref 4.14–5.80)
RDW: 13.6 % (ref 11.6–15.4)
WBC: 8.4 10*3/uL (ref 3.4–10.8)

## 2022-06-28 LAB — URINE CULTURE

## 2022-06-28 LAB — SPECIMEN STATUS REPORT

## 2022-06-30 ENCOUNTER — Ambulatory Visit: Payer: Medicare HMO | Admitting: Family Medicine

## 2022-06-30 ENCOUNTER — Other Ambulatory Visit: Payer: Self-pay

## 2022-06-30 ENCOUNTER — Telehealth: Payer: Self-pay | Admitting: Family Medicine

## 2022-06-30 ENCOUNTER — Ambulatory Visit: Payer: Self-pay

## 2022-06-30 DIAGNOSIS — I959 Hypotension, unspecified: Secondary | ICD-10-CM

## 2022-06-30 DIAGNOSIS — N289 Disorder of kidney and ureter, unspecified: Secondary | ICD-10-CM

## 2022-06-30 NOTE — Telephone Encounter (Signed)
BP readings if up- 133/65 Per note written 06/26/22 BP goal 122-583 and diastolic 46-21'V Called office and spoke to [place] who informed Dr. Rosanna Randy of Bp readings. Advised if pt is feeling better may cancel appt.  Son stated pt is feeling much better. Canceled the appt and will bring pt to lab today or tomorrow.  Reason for Disposition  [1] Caller requesting NON-URGENT health information AND [2] PCP's office is the best resource  Answer Assessment - Initial Assessment Questions 1. REASON FOR CALL or QUESTION: "What is your reason for calling today?" or "How can I best help you?" or "What question do you have that I can help answer?"     BP reading and if can cancel appt  Protocols used: Information Only Call - No Triage-A-AH

## 2022-07-01 LAB — COMPREHENSIVE METABOLIC PANEL
ALT: 119 IU/L — ABNORMAL HIGH (ref 0–44)
AST: 64 IU/L — ABNORMAL HIGH (ref 0–40)
Albumin/Globulin Ratio: 1.1 — ABNORMAL LOW (ref 1.2–2.2)
Albumin: 3.1 g/dL — ABNORMAL LOW (ref 3.7–4.7)
Alkaline Phosphatase: 267 IU/L — ABNORMAL HIGH (ref 44–121)
BUN/Creatinine Ratio: 25 — ABNORMAL HIGH (ref 10–24)
BUN: 43 mg/dL — ABNORMAL HIGH (ref 8–27)
Bilirubin Total: 0.4 mg/dL (ref 0.0–1.2)
CO2: 21 mmol/L (ref 20–29)
Calcium: 9.5 mg/dL (ref 8.6–10.2)
Chloride: 102 mmol/L (ref 96–106)
Creatinine, Ser: 1.69 mg/dL — ABNORMAL HIGH (ref 0.76–1.27)
Globulin, Total: 2.7 g/dL (ref 1.5–4.5)
Glucose: 100 mg/dL — ABNORMAL HIGH (ref 70–99)
Potassium: 4.1 mmol/L (ref 3.5–5.2)
Sodium: 137 mmol/L (ref 134–144)
Total Protein: 5.8 g/dL — ABNORMAL LOW (ref 6.0–8.5)
eGFR: 39 mL/min/{1.73_m2} — ABNORMAL LOW (ref >59)

## 2022-07-01 NOTE — Addendum Note (Signed)
Addended by: Smitty Knudsen on: 07/01/2022 03:22 PM   Modules accepted: Orders

## 2022-07-03 NOTE — Telephone Encounter (Signed)
Home Health Verbal Orders - Caller/AgencyJacklyn Shell Number: [phone]  Requesting OT/PT/Skilled Nursing/Social Work/Speech Therapy: PT  Frequency: 2 week 4  1 week 5

## 2022-07-07 NOTE — Telephone Encounter (Signed)
Angie advised of approved verbal orders.

## 2022-07-08 DIAGNOSIS — N289 Disorder of kidney and ureter, unspecified: Secondary | ICD-10-CM | POA: Diagnosis not present

## 2022-07-09 ENCOUNTER — Telehealth: Payer: Self-pay

## 2022-07-09 LAB — COMPREHENSIVE METABOLIC PANEL
ALT: 39 IU/L (ref 0–44)
AST: 21 IU/L (ref 0–40)
Albumin/Globulin Ratio: 1.4 (ref 1.2–2.2)
Albumin: 3.4 g/dL — ABNORMAL LOW (ref 3.7–4.7)
Alkaline Phosphatase: 195 IU/L — ABNORMAL HIGH (ref 44–121)
BUN/Creatinine Ratio: 20 (ref 10–24)
BUN: 21 mg/dL (ref 8–27)
Bilirubin Total: 0.5 mg/dL (ref 0.0–1.2)
CO2: 20 mmol/L (ref 20–29)
Calcium: 8.9 mg/dL (ref 8.6–10.2)
Chloride: 106 mmol/L (ref 96–106)
Creatinine, Ser: 1.04 mg/dL (ref 0.76–1.27)
Globulin, Total: 2.5 g/dL (ref 1.5–4.5)
Glucose: 103 mg/dL — ABNORMAL HIGH (ref 70–99)
Potassium: 4.8 mmol/L (ref 3.5–5.2)
Sodium: 140 mmol/L (ref 134–144)
Total Protein: 5.9 g/dL — ABNORMAL LOW (ref 6.0–8.5)
eGFR: 69 mL/min/{1.73_m2} (ref >59)

## 2022-07-09 NOTE — Telephone Encounter (Signed)
Noted, was present during phone call pt continued to d/c losartan hctz he will continue to monitor bp no changes for now

## 2022-07-09 NOTE — Telephone Encounter (Unsigned)
Copied from [place] [id number]. Topic: General - Other >> Jul 09, 2022  3:36 PM Jacinto Reap M wrote: Reason for CRM: Pt called for most recent lab results. Pt requests return call. Cb# [phone]

## 2022-07-14 ENCOUNTER — Telehealth: Payer: Self-pay | Admitting: Physician Assistant

## 2022-07-14 NOTE — Telephone Encounter (Signed)
Noted will see pt tomorrow

## 2022-07-14 NOTE — Progress Notes (Signed)
I,Sha'taria Tyson,acting as a Education administrator for [employer], PA-C.,have documented all relevant documentation on the behalf of Mikey Kirschner, PA-C,as directed by  Mikey Kirschner, PA-C while in the presence of Mikey Kirschner, PA-C.   Established patient visit   Patient: [name]   DOB: 1933-03-11   88 y.o. Male  MRN: [id number] Visit Date: 07/15/2022  Today's healthcare provider: Mikey Kirschner, PA-C   Cc. Urinary frequency x 2-3 weeks  Subjective     Pt reports increased urination frequency for the last few days, no real improvement since last month. Reports nocturia-- 4 times a night. Reports more difficult to urinate and low volume. Denies dysuria, visible hematuria, fevers. Reports yesterday and today symptoms are improved.   Medications: Outpatient Medications Prior to Visit  Medication Sig   amLODipine (NORVASC) 5 MG tablet TAKE 1 TABLET EVERY EVENING   aspirin 81 MG tablet Take 81 mg by mouth daily.   Cholecalciferol (VITAMIN D3) 2000 units TABS Take 1 tablet by mouth daily.   co-enzyme Q-10 30 MG capsule Take 30 mg by mouth daily.   dorzolamide (TRUSOPT) 2 % ophthalmic solution Place 1 drop 2 (two) times daily into both eyes.    doxazosin (CARDURA) 2 MG tablet TAKE 1 TABLET EVERY DAY   fluticasone (FLONASE) 50 MCG/ACT nasal spray USE 2 SPRAYS IN EACH NOSTRIL EVERY DAY   ibuprofen (ADVIL,MOTRIN) 200 MG tablet Take 200 mg by mouth daily as needed for moderate pain.   labetalol (NORMODYNE) 200 MG tablet TAKE 1 TABLET TWICE DAILY   latanoprost (XALATAN) 0.005 % ophthalmic solution 1 drop at bedtime.   loratadine (CLARITIN) 10 MG tablet Take 1 tablet (10 mg total) by mouth daily.   LORazepam (ATIVAN) 0.5 MG tablet TAKE 1 TABLET AT BEDTIME AS NEEDED FOR SLEEP   meclizine (ANTIVERT) 25 MG tablet TAKE 1 TABLET DAILY AS NEEDED FOR DIZZINESS.   mometasone (ELOCON) 0.1 % cream APPLY TO ITCHY EARS TWICE DAILY FOR TWELVE DAYS. MAY REPEAT AS NEEDED FOR ITCHY EARS.   Omega-3 Fatty  Acids (FISH OIL) 1000 MG CAPS Take by mouth daily.   pantoprazole (PROTONIX) 20 MG tablet TAKE 1 TABLET EVERY DAY   potassium chloride SA (KLOR-CON M) 20 MEQ tablet Take 1 tablet (20 mEq total) by mouth daily. PATIENT MUST KEEP APPOINTMENT FOR FUTURE REFILLS.   RHOPRESSA 0.02 % SOLN Apply 1 drop to eye at bedtime.   rosuvastatin (CRESTOR) 40 MG tablet Take 1 tablet (40 mg total) by mouth daily. SCHEDULE OFFICE VISIT FOR FUTURE REFILLS.   [DISCONTINUED] losartan-hydrochlorothiazide (HYZAAR) 100-25 MG tablet TAKE 1 TABLET EVERY DAY   No facility-administered medications prior to visit.    Review of Systems  Genitourinary:  Positive for frequency.     Objective    Blood pressure 117/64, pulse 71, weight 178 lb 14.4 oz (81.1 kg), SpO2 98 %.   Physical Exam Vitals reviewed.  Constitutional:      Appearance: He is not ill-appearing.  HENT:     Head: Normocephalic.  Eyes:     Conjunctiva/sclera: Conjunctivae normal.  Cardiovascular:     Rate and Rhythm: Normal rate.  Pulmonary:     Effort: Pulmonary effort is normal. No respiratory distress.  Neurological:     General: No focal deficit present.     Mental Status: He is alert and oriented to person, place, and time.  Psychiatric:        Mood and Affect: Mood normal.        Behavior:  Behavior normal.     Results for orders placed or performed in visit on 07/15/22  POCT Urinalysis Dipstick  Result Value Ref Range   Color, UA     Clarity, UA     Glucose, UA Negative Negative   Bilirubin, UA negative    Ketones, UA negative    Spec Grav, UA 1.020 1.010 - 1.025   Blood, UA moderate    pH, UA 6.5 5.0 - 8.0   Protein, UA Positive (A) Negative   Urobilinogen, UA 2.0 (A) 0.2 or 1.0 E.U./dL   Nitrite, UA positive    Leukocytes, UA Moderate (2+) (A) Negative   Appearance     Odor      Assessment & Plan     Problem List Items Addressed This Visit       Cardiovascular and Mediastinum   Essential hypertension    Continue to  d/c losartan-hctz due to soft bp Continue amlodipine, labetalol, doxazosin        Other   Urinary frequency - Primary    bph w/ luts vs cystitis UA + blood and nitrites sending for culture will base abx therapy off of results d/t recent abx use psa ordered- pt already on alpha 1 blocker, if psa elevated and UTI is not the cause of increased symptoms-- will ref to uro      Relevant Orders   POCT Urinalysis Dipstick (Completed)   PSA   Urine Culture   Abnormal kidney function   Relevant Orders   Comprehensive Metabolic Panel (CMET)   Will repeat labs today instead of next week. If stable or improved further can repeat in 4 mo  Return in about 4 months (around 11/15/2022) for CPE.      I, Mikey Kirschner, PA-C have reviewed all documentation for this visit. The documentation on  07/15/2022 for the exam, diagnosis, procedures, and orders are all accurate and complete.  Mikey Kirschner, PA-C [facility] [address] [place], [place], [address] Office: [phone] Fax: [place]

## 2022-07-14 NOTE — Telephone Encounter (Signed)
Patient came by the office today saying that he is urinating every hour and even at night. He cant sleep because he is up every hour urinating. He is wanting to know if there is something he can take to get that to subside at night. He would like a call back on Tues (10/3) and for someone to speak loud and clear to him. He has hearing difficulty.

## 2022-07-14 NOTE — Telephone Encounter (Signed)
Patient scheduled to be seen tomorrow morning at 10:00 am  FYI

## 2022-07-15 ENCOUNTER — Ambulatory Visit (INDEPENDENT_AMBULATORY_CARE_PROVIDER_SITE_OTHER): Payer: Medicare HMO | Admitting: Physician Assistant

## 2022-07-15 ENCOUNTER — Encounter: Payer: Self-pay | Admitting: Physician Assistant

## 2022-07-15 VITALS — BP 117/64 | HR 71 | Wt 178.9 lb

## 2022-07-15 DIAGNOSIS — N289 Disorder of kidney and ureter, unspecified: Secondary | ICD-10-CM | POA: Diagnosis not present

## 2022-07-15 DIAGNOSIS — R350 Frequency of micturition: Secondary | ICD-10-CM | POA: Diagnosis not present

## 2022-07-15 DIAGNOSIS — I10 Essential (primary) hypertension: Secondary | ICD-10-CM | POA: Diagnosis not present

## 2022-07-15 LAB — POCT URINALYSIS DIPSTICK
Bilirubin, UA: NEGATIVE
Glucose, UA: NEGATIVE
Ketones, UA: NEGATIVE
Nitrite, UA: POSITIVE
Protein, UA: POSITIVE — AB
Spec Grav, UA: 1.02 (ref 1.010–1.025)
Urobilinogen, UA: 2 E.U./dL — AB
pH, UA: 6.5 (ref 5.0–8.0)

## 2022-07-15 NOTE — Assessment & Plan Note (Signed)
bph w/ luts vs cystitis UA + blood and nitrites sending for culture will base abx therapy off of results d/t recent abx use psa ordered- pt already on alpha 1 blocker, if psa elevated and UTI is not the cause of increased symptoms-- will ref to uro

## 2022-07-15 NOTE — Assessment & Plan Note (Signed)
Continue to d/c losartan-hctz due to soft bp Continue amlodipine, labetalol, doxazosin

## 2022-07-16 ENCOUNTER — Encounter: Payer: Medicare HMO | Admitting: Family Medicine

## 2022-07-16 LAB — COMPREHENSIVE METABOLIC PANEL
ALT: 24 IU/L (ref 0–44)
AST: 16 IU/L (ref 0–40)
Albumin/Globulin Ratio: 1.4 (ref 1.2–2.2)
Albumin: 3.4 g/dL — ABNORMAL LOW (ref 3.7–4.7)
Alkaline Phosphatase: 194 IU/L — ABNORMAL HIGH (ref 44–121)
BUN/Creatinine Ratio: 21 (ref 10–24)
BUN: 22 mg/dL (ref 8–27)
Bilirubin Total: 0.4 mg/dL (ref 0.0–1.2)
CO2: 22 mmol/L (ref 20–29)
Calcium: 9.5 mg/dL (ref 8.6–10.2)
Chloride: 106 mmol/L (ref 96–106)
Creatinine, Ser: 1.06 mg/dL (ref 0.76–1.27)
Globulin, Total: 2.5 g/dL (ref 1.5–4.5)
Glucose: 99 mg/dL (ref 70–99)
Potassium: 4.8 mmol/L (ref 3.5–5.2)
Sodium: 141 mmol/L (ref 134–144)
Total Protein: 5.9 g/dL — ABNORMAL LOW (ref 6.0–8.5)
eGFR: 68 mL/min/{1.73_m2} (ref >59)

## 2022-07-16 LAB — PSA: Prostate Specific Ag, Serum: 3 ng/mL (ref 0.0–4.0)

## 2022-07-17 ENCOUNTER — Other Ambulatory Visit: Payer: Self-pay | Admitting: Physician Assistant

## 2022-07-17 ENCOUNTER — Encounter: Payer: Medicare HMO | Admitting: Physician Assistant

## 2022-07-17 LAB — URINE CULTURE

## 2022-07-17 MED ORDER — CIPROFLOXACIN HCL 250 MG PO TABS: 250.0000 mg | ORAL_TABLET | Freq: Two times a day (BID) | ORAL | 0 refills | Status: AC

## 2022-07-29 ENCOUNTER — Other Ambulatory Visit (INDEPENDENT_AMBULATORY_CARE_PROVIDER_SITE_OTHER): Payer: Medicare HMO | Admitting: Family Medicine

## 2022-07-29 DIAGNOSIS — R269 Unspecified abnormalities of gait and mobility: Secondary | ICD-10-CM | POA: Diagnosis not present

## 2022-07-29 DIAGNOSIS — N3001 Acute cystitis with hematuria: Secondary | ICD-10-CM

## 2022-07-29 DIAGNOSIS — M1990 Unspecified osteoarthritis, unspecified site: Secondary | ICD-10-CM | POA: Diagnosis not present

## 2022-07-29 DIAGNOSIS — F411 Generalized anxiety disorder: Secondary | ICD-10-CM | POA: Diagnosis not present

## 2022-07-29 DIAGNOSIS — S76011A Strain of muscle, fascia and tendon of right hip, initial encounter: Secondary | ICD-10-CM | POA: Diagnosis not present

## 2022-07-29 DIAGNOSIS — I25810 Atherosclerosis of coronary artery bypass graft(s) without angina pectoris: Secondary | ICD-10-CM

## 2022-07-29 DIAGNOSIS — I499 Cardiac arrhythmia, unspecified: Secondary | ICD-10-CM

## 2022-07-29 DIAGNOSIS — I872 Venous insufficiency (chronic) (peripheral): Secondary | ICD-10-CM | POA: Diagnosis not present

## 2022-07-29 DIAGNOSIS — I10 Essential (primary) hypertension: Secondary | ICD-10-CM

## 2022-07-29 DIAGNOSIS — R350 Frequency of micturition: Secondary | ICD-10-CM

## 2022-07-29 DIAGNOSIS — D649 Anemia, unspecified: Secondary | ICD-10-CM

## 2022-07-29 DIAGNOSIS — Z9181 History of falling: Secondary | ICD-10-CM

## 2022-07-29 DIAGNOSIS — E669 Obesity, unspecified: Secondary | ICD-10-CM

## 2022-07-29 DIAGNOSIS — R32 Unspecified urinary incontinence: Secondary | ICD-10-CM

## 2022-07-29 DIAGNOSIS — K219 Gastro-esophageal reflux disease without esophagitis: Secondary | ICD-10-CM

## 2022-07-29 DIAGNOSIS — E7800 Pure hypercholesterolemia, unspecified: Secondary | ICD-10-CM

## 2022-07-29 DIAGNOSIS — K429 Umbilical hernia without obstruction or gangrene: Secondary | ICD-10-CM

## 2022-07-29 NOTE — Progress Notes (Signed)
Received home health orders orders from [facility]. Start of care 06/30/2022.   Certification and orders from 06/30/2022 through 08/28/2022 are reviewed, signed and faxed back to home health company.  Need of intermittent skilled services at home: Pain with ambulation  The home health care plan has been established by me and will be reviewed and updated as needed to maximize patient recovery.  I certify that all home health services have been and will be furnished to the patient while under my care or other physician in my practice.  Face-to-face encounter in which the need for home health services was established: 06/26/2022 with Sharen Counter, PA  Patient is receiving home health services for the following diagnoses: Problem List Items Addressed This Visit       Cardiovascular and Mediastinum   Essential hypertension   Coronary atherosclerosis   Cardiac dysrhythmia   Venous insufficiency of leg     Digestive   Acid reflux     Musculoskeletal and Integument   Osteoarthritis   Muscle strain of right gluteal region - Primary     Other   ANEMIA   Generalized anxiety disorder   Adiposity   Umbilical hernia without obstruction and without gangrene   Pure hypercholesterolemia   Gait disturbance   Urinary frequency   Other Visit Diagnoses     Acute cystitis with hematuria       Urinary incontinence, unspecified type       History of bad fall            Lavon Paganini, MD

## 2022-07-31 DIAGNOSIS — H524 Presbyopia: Secondary | ICD-10-CM | POA: Diagnosis not present

## 2022-07-31 DIAGNOSIS — Z0100 Encounter for examination of eyes and vision without abnormal findings: Secondary | ICD-10-CM | POA: Diagnosis not present

## 2022-07-31 DIAGNOSIS — H40153 Residual stage of open-angle glaucoma, bilateral: Secondary | ICD-10-CM | POA: Diagnosis not present

## 2022-08-26 ENCOUNTER — Other Ambulatory Visit: Payer: Self-pay | Admitting: Cardiology

## 2022-08-26 ENCOUNTER — Telehealth: Payer: Self-pay | Admitting: Physician Assistant

## 2022-08-26 ENCOUNTER — Other Ambulatory Visit: Payer: Self-pay

## 2022-08-26 DIAGNOSIS — G4700 Insomnia, unspecified: Secondary | ICD-10-CM

## 2022-08-26 DIAGNOSIS — R42 Dizziness and giddiness: Secondary | ICD-10-CM

## 2022-08-26 DIAGNOSIS — I25810 Atherosclerosis of coronary artery bypass graft(s) without angina pectoris: Secondary | ICD-10-CM

## 2022-08-26 DIAGNOSIS — K219 Gastro-esophageal reflux disease without esophagitis: Secondary | ICD-10-CM

## 2022-08-26 MED ORDER — PANTOPRAZOLE SODIUM 20 MG PO TBEC: 20.0000 mg | DELAYED_RELEASE_TABLET | Freq: Every day | ORAL | 1 refills | Status: AC

## 2022-08-26 MED ORDER — MECLIZINE HCL 25 MG PO TABS: ORAL_TABLET | ORAL | 3 refills | Status: AC

## 2022-08-26 MED ORDER — LORAZEPAM 0.5 MG PO TABS: 0.5000 mg | ORAL_TABLET | Freq: Every evening | ORAL | 0 refills | Status: DC | PRN

## 2022-08-26 NOTE — Addendum Note (Signed)
Addended by: Doristine Devoid on: 08/26/2022 01:40 PM   Modules accepted: Orders

## 2022-08-26 NOTE — Telephone Encounter (Signed)
Patient is need of refills for Lorazepam 0.5 mg. Please send to [place]

## 2022-08-26 NOTE — Telephone Encounter (Signed)
Please Review

## 2022-08-26 NOTE — Telephone Encounter (Signed)
Fax received from [facility] for refill request on the following medication:  Pantoprazole 20 mg tab Meclizine 25gmg  Patient scheduled to see Mikey Kirschner for CPE 11/19/2022

## 2022-08-28 NOTE — Telephone Encounter (Addendum)
Patient has 5 pills left and states pharmacy stated they have not received 08/26/2022 prescription. Patient spoke to pharmacy today. Patient would like a 90 day supply because he stated he does not want to go through this again.. Patient requesting nurse re send LORazepam (ATIVAN) 0.5 MG tablet  and would like a follow up call.    [place], [place] Phone: [phone]  Fax: [phone]

## 2022-08-28 NOTE — Telephone Encounter (Signed)
I spoke to [place] at 4:39 p.m.   They needed Dr. Ruffin Frederick correct DEA # and I gave that to [facility], Pharmacist.  Patient should be able to get the RX sent in on 08/26/22 for #30 Lorazepam.  I called patient to advise.

## 2022-08-29 MED ORDER — LORAZEPAM 0.5 MG PO TABS: 0.5000 mg | ORAL_TABLET | Freq: Every evening | ORAL | 0 refills | Status: DC | PRN

## 2022-08-29 NOTE — Telephone Encounter (Signed)
Rx for lorazepam resubmitted to pharmacy for 30 day supply.   Recommend frequent reassessment of appropriateness of this continued prescription in patient >65 with increased risk of adverse effects.   Eulis Foster, MD  [facility]

## 2022-08-29 NOTE — Telephone Encounter (Signed)
Please Review

## 2022-08-29 NOTE — Addendum Note (Signed)
Addended by: Adolph Pollack on: 08/29/2022 02:44 PM   Modules accepted: Orders

## 2022-09-05 IMAGING — CT CT ANGIO HEAD
1 of 11 series · 4 of 33 positions shown · IV contrast (omnipaque)
Comparison: 09/28/2020 and prior.

CLINICAL DATA: Left ear ringing.

EXAM:
CT ANGIOGRAPHY HEAD AND NECK
TECHNIQUE: Multidetector CT imaging of the head and neck was performed using
the standard protocol during bolus administration of intravenous
contrast. Multiplanar CT image reconstructions and MIPs were
obtained to evaluate the vascular anatomy. Carotid stenosis
measurements (when applicable) are obtained utilizing NASCET
criteria, using the distal internal carotid diameter as the
denominator.
CONTRAST:  75mL OMNIPAQUE IOHEXOL 350 MG/ML SOLN

[Series 16: ax thin mips cta head & neck 1.00 · axial · 0.65mm/px · z∈[-689,-471]mm · 4 of 364 slices shown]
[im 73/364  soft-tissue]
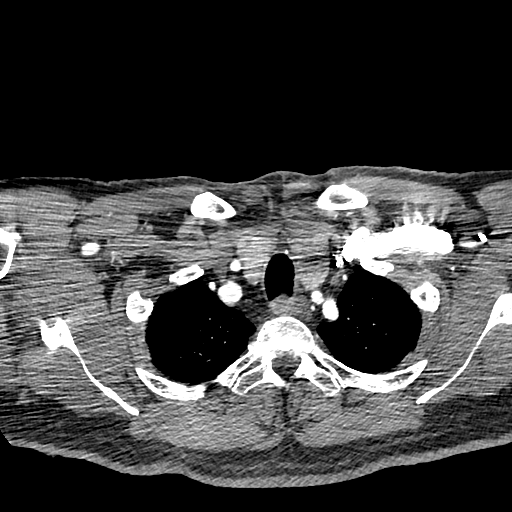
[im 146/364  bone]
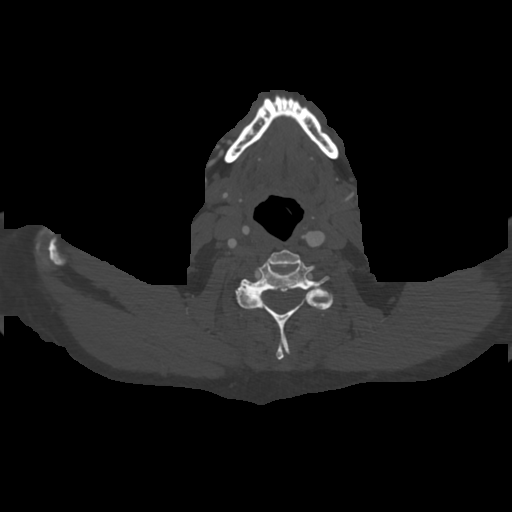
[im 218/364  soft-tissue]
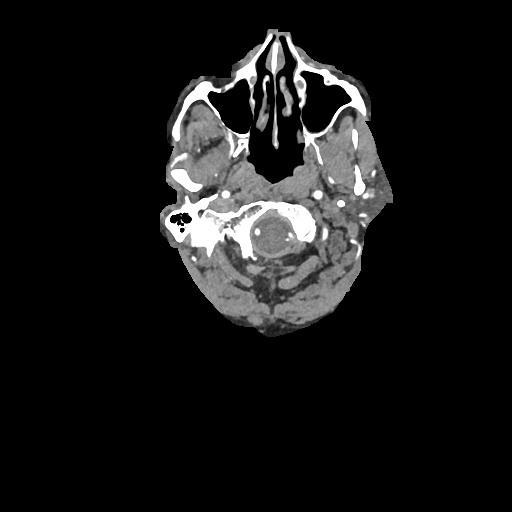
[im 291/364  bone]
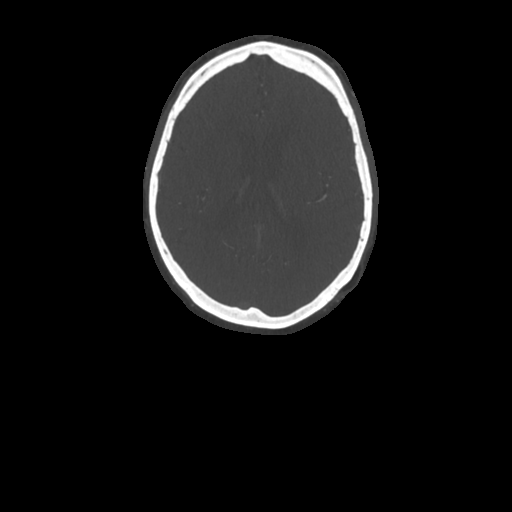

[4 of 33 positions shown; findings below may reference images not displayed]

FINDINGS: CT HEAD FINDINGS

Brain: No acute infarct or intracranial hemorrhage. Benign bilateral
basal ganglia calcifications. No mass lesion. No midline shift,
ventriculomegaly or extra-axial fluid collection. Chronic
microvascular ischemic changes.

Vascular: No hyperdense vessel or unexpected calcification.

Skull: Negative for fracture or focal lesion.

Sinuses/Orbits: Normal orbits. Clear paranasal sinuses. No mastoid
effusion.

Other: Small left parietal scalp lipoma.

Review of the MIP images confirms the above findings

CTA NECK FINDINGS

Aortic arch: Standard branching. Imaged portion shows no evidence of
aneurysm or dissection. No significant stenosis of the major arch
vessel origins. Mild arch atherosclerotic calcifications.

Right carotid system: Patent.

Left carotid system: Patent. Proximal ICA atherosclerotic
calcifications with approximately 20% luminal narrowing.

Vertebral arteries: Aortic origin of the left vertebral artery.
Dominant right vertebral artery. Patent.

Skeleton: Multilevel spondylosis. No suspicious osseous lesion. Post
sternotomy sequela.

Other neck: No adenopathy. Left thyroid hypointensity measuring
cm, partially obscured by adjacent artifact. Smaller right thyroid
hypointensity.

Upper chest: Atelectasis.

Review of the MIP images confirms the above findings

CTA HEAD FINDINGS

Anterior circulation: Patent ICAs. Bilateral carotid siphon
atherosclerotic calcifications with mild narrowing. Patent bilateral
ophthalmic artery origins. No evidence of paraophthalmic aneurysm.
Right A1 segment aplasia. Patent ACAs. Patent left MCA. Patent right
MCA.

Posterior circulation: Patent PICA. Right V4 segment atherosclerotic
calcifications with mild narrowing. Patent basilar artery. Patent
superior cerebellar arteries. Fetal origin of the right PCA, patent.
Patent left PCA with mild P2 segment narrowing.

Venous sinuses: As permitted by contrast timing, patent.

Anatomic variants: Please see above.

Review of the MIP images confirms the above findings
IMPRESSION: Head CT:

Chronic microvascular ischemic changes. Otherwise unremarkable head
CT.

CTA head and neck:

No evidence of aneurysm. Mild cavernous ICA, right V4 segment and
left P2 segment narrowing.

No evidence of dissection or large vessel occlusion.

## 2022-10-16 DIAGNOSIS — L820 Inflamed seborrheic keratosis: Secondary | ICD-10-CM | POA: Diagnosis not present

## 2022-10-16 DIAGNOSIS — D485 Neoplasm of uncertain behavior of skin: Secondary | ICD-10-CM | POA: Diagnosis not present

## 2022-10-16 DIAGNOSIS — D2271 Melanocytic nevi of right lower limb, including hip: Secondary | ICD-10-CM | POA: Diagnosis not present

## 2022-10-16 DIAGNOSIS — L538 Other specified erythematous conditions: Secondary | ICD-10-CM | POA: Diagnosis not present

## 2022-10-16 DIAGNOSIS — D2262 Melanocytic nevi of left upper limb, including shoulder: Secondary | ICD-10-CM | POA: Diagnosis not present

## 2022-10-16 DIAGNOSIS — L821 Other seborrheic keratosis: Secondary | ICD-10-CM | POA: Diagnosis not present

## 2022-10-16 DIAGNOSIS — D044 Carcinoma in situ of skin of scalp and neck: Secondary | ICD-10-CM | POA: Diagnosis not present

## 2022-10-16 DIAGNOSIS — D225 Melanocytic nevi of trunk: Secondary | ICD-10-CM | POA: Diagnosis not present

## 2022-10-16 DIAGNOSIS — D2261 Melanocytic nevi of right upper limb, including shoulder: Secondary | ICD-10-CM | POA: Diagnosis not present

## 2022-10-16 DIAGNOSIS — D2272 Melanocytic nevi of left lower limb, including hip: Secondary | ICD-10-CM | POA: Diagnosis not present

## 2022-10-31 ENCOUNTER — Telehealth: Payer: Self-pay | Admitting: Family Medicine

## 2022-10-31 NOTE — Telephone Encounter (Signed)
Patient is in need of Lorazepam 0.5 mg. sent to [facility].

## 2022-11-04 ENCOUNTER — Telehealth: Payer: Self-pay

## 2022-11-04 ENCOUNTER — Telehealth: Payer: Self-pay | Admitting: Physician Assistant

## 2022-11-04 DIAGNOSIS — G4700 Insomnia, unspecified: Secondary | ICD-10-CM

## 2022-11-04 MED ORDER — LORAZEPAM 0.5 MG PO TABS: 0.5000 mg | ORAL_TABLET | Freq: Every evening | ORAL | 0 refills | Status: AC | PRN

## 2022-11-04 NOTE — Telephone Encounter (Signed)
-----  Message from Mikey Kirschner, PA-C sent at 11/04/2022  3:17 PM EST ----- Can you change his appt in feb to dr Quentin Cornwall as she is his pcp ty

## 2022-11-04 NOTE — Addendum Note (Signed)
Addended by: Adolph Pollack on: 11/04/2022 02:43 PM   Modules accepted: Orders

## 2022-11-04 NOTE — Telephone Encounter (Signed)
Patient is calling back for a 2nd time regarding refills of Lorazepam 0.5 mg to be sent to [facility].

## 2022-11-04 NOTE — Telephone Encounter (Signed)
Refill for lorazepam sent to pharmacy.   PDMP consistently with monthly refills   Next appt with Sharen Counter on 11/19/22.   Eulis Foster, MD  [facility]

## 2022-11-05 ENCOUNTER — Telehealth: Payer: Self-pay | Admitting: Family Medicine

## 2022-11-05 NOTE — Telephone Encounter (Signed)
[facility] faxed refill request for the following medications:  LORazepam (ATIVAN) 0.5 MG tablet   Please advise.

## 2022-11-05 NOTE — Telephone Encounter (Signed)
Looks like patient's Lorazepam was sent to [facility] as per patient request., Please see telephone encounter from 10/31/2022

## 2022-11-11 ENCOUNTER — Other Ambulatory Visit: Payer: Self-pay | Admitting: Family Medicine

## 2022-11-11 DIAGNOSIS — G4700 Insomnia, unspecified: Secondary | ICD-10-CM

## 2022-11-17 DIAGNOSIS — C4442 Squamous cell carcinoma of skin of scalp and neck: Secondary | ICD-10-CM | POA: Diagnosis not present

## 2022-11-17 DIAGNOSIS — L570 Actinic keratosis: Secondary | ICD-10-CM | POA: Diagnosis not present

## 2022-11-19 ENCOUNTER — Ambulatory Visit: Payer: Medicare HMO | Admitting: Family Medicine

## 2022-11-20 DIAGNOSIS — H40153 Residual stage of open-angle glaucoma, bilateral: Secondary | ICD-10-CM | POA: Diagnosis not present

## 2022-11-26 DIAGNOSIS — S76011S Strain of muscle, fascia and tendon of right hip, sequela: Secondary | ICD-10-CM | POA: Diagnosis not present

## 2022-11-26 DIAGNOSIS — I25810 Atherosclerosis of coronary artery bypass graft(s) without angina pectoris: Secondary | ICD-10-CM | POA: Diagnosis not present

## 2022-11-26 DIAGNOSIS — R269 Unspecified abnormalities of gait and mobility: Secondary | ICD-10-CM | POA: Diagnosis not present

## 2022-11-26 DIAGNOSIS — F411 Generalized anxiety disorder: Secondary | ICD-10-CM | POA: Diagnosis not present

## 2022-11-26 DIAGNOSIS — I10 Essential (primary) hypertension: Secondary | ICD-10-CM | POA: Diagnosis not present

## 2022-11-26 DIAGNOSIS — E7800 Pure hypercholesterolemia, unspecified: Secondary | ICD-10-CM | POA: Diagnosis not present

## 2023-01-06 DIAGNOSIS — H903 Sensorineural hearing loss, bilateral: Secondary | ICD-10-CM | POA: Diagnosis not present

## 2023-01-12 DIAGNOSIS — I25810 Atherosclerosis of coronary artery bypass graft(s) without angina pectoris: Secondary | ICD-10-CM | POA: Diagnosis not present

## 2023-01-12 DIAGNOSIS — E7800 Pure hypercholesterolemia, unspecified: Secondary | ICD-10-CM | POA: Diagnosis not present

## 2023-01-12 DIAGNOSIS — I10 Essential (primary) hypertension: Secondary | ICD-10-CM | POA: Diagnosis not present

## 2023-01-12 DIAGNOSIS — E782 Mixed hyperlipidemia: Secondary | ICD-10-CM | POA: Diagnosis not present

## 2023-01-13 ENCOUNTER — Other Ambulatory Visit: Payer: Self-pay | Admitting: Physician Assistant

## 2023-01-13 DIAGNOSIS — R42 Dizziness and giddiness: Secondary | ICD-10-CM

## 2023-01-13 MED ORDER — FLUTICASONE PROPIONATE 50 MCG/ACT NA SUSP: 2.0000 | Freq: Every day | NASAL | 3 refills | Status: AC

## 2023-01-16 DIAGNOSIS — H903 Sensorineural hearing loss, bilateral: Secondary | ICD-10-CM | POA: Diagnosis not present

## 2023-01-21 ENCOUNTER — Other Ambulatory Visit: Payer: Self-pay | Admitting: Physician Assistant

## 2023-01-21 DIAGNOSIS — K219 Gastro-esophageal reflux disease without esophagitis: Secondary | ICD-10-CM

## 2023-01-28 DIAGNOSIS — R58 Hemorrhage, not elsewhere classified: Secondary | ICD-10-CM | POA: Diagnosis not present

## 2023-01-28 DIAGNOSIS — C44329 Squamous cell carcinoma of skin of other parts of face: Secondary | ICD-10-CM | POA: Diagnosis not present

## 2023-01-28 DIAGNOSIS — D485 Neoplasm of uncertain behavior of skin: Secondary | ICD-10-CM | POA: Diagnosis not present

## 2023-02-24 DIAGNOSIS — R269 Unspecified abnormalities of gait and mobility: Secondary | ICD-10-CM | POA: Diagnosis not present

## 2023-02-24 DIAGNOSIS — I10 Essential (primary) hypertension: Secondary | ICD-10-CM | POA: Diagnosis not present

## 2023-02-24 DIAGNOSIS — I25810 Atherosclerosis of coronary artery bypass graft(s) without angina pectoris: Secondary | ICD-10-CM | POA: Diagnosis not present

## 2023-02-24 DIAGNOSIS — D049 Carcinoma in situ of skin, unspecified: Secondary | ICD-10-CM | POA: Diagnosis not present

## 2023-02-24 DIAGNOSIS — E7800 Pure hypercholesterolemia, unspecified: Secondary | ICD-10-CM | POA: Diagnosis not present

## 2023-02-24 DIAGNOSIS — Z0000 Encounter for general adult medical examination without abnormal findings: Secondary | ICD-10-CM | POA: Diagnosis not present

## 2023-02-24 DIAGNOSIS — R634 Abnormal weight loss: Secondary | ICD-10-CM | POA: Diagnosis not present

## 2023-02-24 DIAGNOSIS — F411 Generalized anxiety disorder: Secondary | ICD-10-CM | POA: Diagnosis not present

## 2023-03-05 DIAGNOSIS — C44329 Squamous cell carcinoma of skin of other parts of face: Secondary | ICD-10-CM | POA: Diagnosis not present

## 2023-03-05 DIAGNOSIS — L988 Other specified disorders of the skin and subcutaneous tissue: Secondary | ICD-10-CM | POA: Diagnosis not present

## 2023-03-05 DIAGNOSIS — L578 Other skin changes due to chronic exposure to nonionizing radiation: Secondary | ICD-10-CM | POA: Diagnosis not present

## 2023-03-05 DIAGNOSIS — L814 Other melanin hyperpigmentation: Secondary | ICD-10-CM | POA: Diagnosis not present

## 2023-03-31 DIAGNOSIS — H40153 Residual stage of open-angle glaucoma, bilateral: Secondary | ICD-10-CM | POA: Diagnosis not present

## 2023-04-21 DIAGNOSIS — L820 Inflamed seborrheic keratosis: Secondary | ICD-10-CM | POA: Diagnosis not present

## 2023-04-21 DIAGNOSIS — D2261 Melanocytic nevi of right upper limb, including shoulder: Secondary | ICD-10-CM | POA: Diagnosis not present

## 2023-04-21 DIAGNOSIS — L538 Other specified erythematous conditions: Secondary | ICD-10-CM | POA: Diagnosis not present

## 2023-04-21 DIAGNOSIS — D2262 Melanocytic nevi of left upper limb, including shoulder: Secondary | ICD-10-CM | POA: Diagnosis not present

## 2023-04-21 DIAGNOSIS — C44212 Basal cell carcinoma of skin of right ear and external auricular canal: Secondary | ICD-10-CM | POA: Diagnosis not present

## 2023-04-21 DIAGNOSIS — L570 Actinic keratosis: Secondary | ICD-10-CM | POA: Diagnosis not present

## 2023-04-21 DIAGNOSIS — D225 Melanocytic nevi of trunk: Secondary | ICD-10-CM | POA: Diagnosis not present

## 2023-04-21 DIAGNOSIS — D2272 Melanocytic nevi of left lower limb, including hip: Secondary | ICD-10-CM | POA: Diagnosis not present

## 2023-04-21 DIAGNOSIS — D485 Neoplasm of uncertain behavior of skin: Secondary | ICD-10-CM | POA: Diagnosis not present

## 2023-04-21 DIAGNOSIS — Z85828 Personal history of other malignant neoplasm of skin: Secondary | ICD-10-CM | POA: Diagnosis not present

## 2023-04-21 DIAGNOSIS — C44329 Squamous cell carcinoma of skin of other parts of face: Secondary | ICD-10-CM | POA: Diagnosis not present

## 2023-05-19 ENCOUNTER — Other Ambulatory Visit: Payer: Self-pay | Admitting: Family Medicine

## 2023-05-19 DIAGNOSIS — G4700 Insomnia, unspecified: Secondary | ICD-10-CM

## 2023-05-25 ENCOUNTER — Other Ambulatory Visit: Payer: Self-pay

## 2023-05-25 ENCOUNTER — Encounter: Payer: Self-pay | Admitting: Emergency Medicine

## 2023-05-25 ENCOUNTER — Emergency Department
Admission: EM | Admit: 2023-05-25 | Discharge: 2023-05-25 | Disposition: A | Discharge status: Home / Self Care | Payer: Medicare HMO | Source: Home / Self Care | Attending: Emergency Medicine | Admitting: Emergency Medicine

## 2023-05-25 DIAGNOSIS — I25810 Atherosclerosis of coronary artery bypass graft(s) without angina pectoris: Secondary | ICD-10-CM | POA: Diagnosis not present

## 2023-05-25 DIAGNOSIS — Z0001 Encounter for general adult medical examination with abnormal findings: Secondary | ICD-10-CM | POA: Diagnosis not present

## 2023-05-25 DIAGNOSIS — M25522 Pain in left elbow: Secondary | ICD-10-CM | POA: Insufficient documentation

## 2023-05-25 DIAGNOSIS — M79642 Pain in left hand: Secondary | ICD-10-CM | POA: Diagnosis not present

## 2023-05-25 DIAGNOSIS — M79602 Pain in left arm: Secondary | ICD-10-CM | POA: Diagnosis not present

## 2023-05-25 DIAGNOSIS — Z139 Encounter for screening, unspecified: Secondary | ICD-10-CM

## 2023-05-25 NOTE — ED Triage Notes (Signed)
Pt to ED for left elbow pain this morning. Denies injuries. Reports lifted garbage yesterday. Denies pain at this time.

## 2023-05-25 NOTE — ED Provider Notes (Signed)
[facility] Provider Note    Event Date/Time   First MD Initiated Contact with Patient 05/25/23 0710     (approximate)   History   Elbow Pain   HPI  [name] is a 89 y.o. male with a history of CAD who presents with complaints of left arm pain.  Patient reports that this morning he had pain in his left elbow and left hand.  He thinks this is likely related to arthritis and lifting heavy trash cans yesterday however also notes that in the past he had similar discomfort and then 3 days later had a heart attack.  No chest pain, no shortness of breath, reports pain is resolved in his arm.     Physical Exam   Triage Vital Signs: ED Triage Vitals [05/25/23 0704]  Encounter Vitals Group     BP 131/74     Systolic BP Percentile      Diastolic BP Percentile      Pulse Rate 72     Resp 18     Temp 97.7 F (36.5 C)     Temp src      SpO2 95 %     Weight 80.7 kg (178 lb)     Height 1.727 m (5\' 8" )     Head Circumference      Peak Flow      Pain Score 0     Pain Loc      Pain Education      Exclude from Growth Chart     Most recent vital signs: Vitals:   05/25/23 0704  BP: 131/74  Pulse: 72  Resp: 18  Temp: 97.7 F (36.5 C)  SpO2: 95%     General: Awake, no distress.  CV:  Good peripheral perfusion.  Resp:  Normal effort.  Abd:  No distention.  Other:  Normal range of motion of the left elbow, no rash, warm and well-perfused distally, no swelling.  No pain   ED Results / Procedures / Treatments   Labs (all labs ordered are listed, but only abnormal results are displayed) Labs Reviewed - No data to display   EKG  ED ECG REPORT I, Jene Every, the attending physician, personally viewed and interpreted this ECG.  Date: 05/25/2023  Rhythm: normal sinus rhythm QRS Axis: Left axis Intervals: normal ST/T Wave abnormalities: normal Narrative Interpretation: no evidence of acute  ischemia    RADIOLOGY     PROCEDURES:  Critical Care performed:   Procedures   MEDICATIONS ORDERED IN ED: Medications - No data to display   IMPRESSION / MDM / ASSESSMENT AND PLAN / ED COURSE  I reviewed the triage vital signs and the nursing notes. Patient's presentation is most consistent with acute complicated illness / injury requiring diagnostic workup.  Patient presents with left arm pain this morning as detailed above.  No chest pain, no shortness of breath, no palpitations.  No diaphoresis.  Asymptomatic at this time  Will check EKG, suspect musculoskeletal pain  ----------------------------------------- 7:36 AM on 05/25/2023 ----------------------------------------- EKG is reassuring, again given that the patient is asymptomatic without chest pain shortness of breath no indication for admission or further workup at this time.  Referral to cardiology as needed, return precautions discussed.      FINAL CLINICAL IMPRESSION(S) / ED DIAGNOSES   Final diagnoses:  Encounter for medical screening examination  Coronary atherosclerosis of autologous artery bypass graft without angina     Rx / DC Orders  ED Discharge Orders          Ordered    Ambulatory referral to Cardiology       Comments: If you have not heard from the Cardiology office within the next 72 hours please call [phone].   05/25/23 0737             Note:  This document was prepared using Dragon voice recognition software and may include unintentional dictation errors.   Jene Every, MD 05/25/23 [phone]

## 2023-06-01 ENCOUNTER — Telehealth: Payer: Self-pay

## 2023-06-01 NOTE — Telephone Encounter (Signed)
Transition Care Management Unsuccessful Follow-up Telephone Call  Date of discharge and from where:  05/25/2023 [facility]  Attempts:  1st Attempt  Reason for unsuccessful TCM follow-up call:  No answer/busy  Cassey Bacigalupo [facility]  [facility] Population Health Community Resource Care Guide   [email or web address]  ?? [phone]   Website: [email or web address]  [email or web address]

## 2023-06-02 ENCOUNTER — Telehealth: Payer: Self-pay

## 2023-06-02 NOTE — Telephone Encounter (Signed)
Transition Care Management Follow-up Telephone Call Date of discharge and from where: 05/25/2023 [facility] How have you been since you were released from the hospital? Patient stated that he is feeling better. Any questions or concerns? No  Items Reviewed: Did the pt receive and understand the discharge instructions provided? Yes  Medications obtained and verified?  No medication prescribed. Other? No  Any new allergies since your discharge? No  Dietary orders reviewed? Yes Do you have support at home? Yes   Follow up appointments reviewed:  PCP Hospital f/u appt confirmed? Yes  Scheduled to see Doyce Loose, MD on 07/24/2023 @ [facility]. Specialist Hospital f/u appt confirmed? No  Scheduled to see  on  @ . Are transportation arrangements needed? No  If their condition worsens, is the pt aware to call PCP or go to the Emergency Dept.? Yes Was the patient provided with contact information for the PCP's office or ED? Yes Was to pt encouraged to call back with questions or concerns? Yes  Patricie Geeslin [facility]  [facility] Population Health Community Resource Care Guide   [email or web address]  ?? [phone]   Website: [email or web address]  [email or web address]

## 2023-06-09 DIAGNOSIS — D0439 Carcinoma in situ of skin of other parts of face: Secondary | ICD-10-CM | POA: Diagnosis not present

## 2023-06-09 DIAGNOSIS — C44329 Squamous cell carcinoma of skin of other parts of face: Secondary | ICD-10-CM | POA: Diagnosis not present

## 2023-06-29 DIAGNOSIS — C44212 Basal cell carcinoma of skin of right ear and external auricular canal: Secondary | ICD-10-CM | POA: Diagnosis not present

## 2023-07-09 ENCOUNTER — Other Ambulatory Visit: Payer: Self-pay | Admitting: Physician Assistant

## 2023-07-09 DIAGNOSIS — R42 Dizziness and giddiness: Secondary | ICD-10-CM

## 2023-07-14 DIAGNOSIS — E7800 Pure hypercholesterolemia, unspecified: Secondary | ICD-10-CM | POA: Diagnosis not present

## 2023-07-14 DIAGNOSIS — I10 Essential (primary) hypertension: Secondary | ICD-10-CM | POA: Diagnosis not present

## 2023-07-14 DIAGNOSIS — Z23 Encounter for immunization: Secondary | ICD-10-CM | POA: Diagnosis not present

## 2023-07-14 DIAGNOSIS — I25810 Atherosclerosis of coronary artery bypass graft(s) without angina pectoris: Secondary | ICD-10-CM | POA: Diagnosis not present

## 2023-07-24 DIAGNOSIS — E7800 Pure hypercholesterolemia, unspecified: Secondary | ICD-10-CM | POA: Diagnosis not present

## 2023-07-24 DIAGNOSIS — I10 Essential (primary) hypertension: Secondary | ICD-10-CM | POA: Diagnosis not present

## 2023-07-24 DIAGNOSIS — I25810 Atherosclerosis of coronary artery bypass graft(s) without angina pectoris: Secondary | ICD-10-CM | POA: Diagnosis not present

## 2023-07-24 DIAGNOSIS — I872 Venous insufficiency (chronic) (peripheral): Secondary | ICD-10-CM | POA: Diagnosis not present

## 2023-07-27 DIAGNOSIS — H40153 Residual stage of open-angle glaucoma, bilateral: Secondary | ICD-10-CM | POA: Diagnosis not present

## 2023-07-27 DIAGNOSIS — H524 Presbyopia: Secondary | ICD-10-CM | POA: Diagnosis not present

## 2023-09-01 ENCOUNTER — Other Ambulatory Visit: Payer: Self-pay | Admitting: Cardiology

## 2023-09-01 DIAGNOSIS — I25810 Atherosclerosis of coronary artery bypass graft(s) without angina pectoris: Secondary | ICD-10-CM

## 2023-09-08 ENCOUNTER — Other Ambulatory Visit: Payer: Self-pay | Admitting: Physician Assistant

## 2023-09-08 DIAGNOSIS — R42 Dizziness and giddiness: Secondary | ICD-10-CM

## 2023-11-23 DIAGNOSIS — D2272 Melanocytic nevi of left lower limb, including hip: Secondary | ICD-10-CM | POA: Diagnosis not present

## 2023-11-23 DIAGNOSIS — D2262 Melanocytic nevi of left upper limb, including shoulder: Secondary | ICD-10-CM | POA: Diagnosis not present

## 2023-11-23 DIAGNOSIS — L820 Inflamed seborrheic keratosis: Secondary | ICD-10-CM | POA: Diagnosis not present

## 2023-11-23 DIAGNOSIS — L538 Other specified erythematous conditions: Secondary | ICD-10-CM | POA: Diagnosis not present

## 2023-11-23 DIAGNOSIS — D225 Melanocytic nevi of trunk: Secondary | ICD-10-CM | POA: Diagnosis not present

## 2023-11-23 DIAGNOSIS — D0439 Carcinoma in situ of skin of other parts of face: Secondary | ICD-10-CM | POA: Diagnosis not present

## 2023-11-23 DIAGNOSIS — L2989 Other pruritus: Secondary | ICD-10-CM | POA: Diagnosis not present

## 2023-11-23 DIAGNOSIS — D485 Neoplasm of uncertain behavior of skin: Secondary | ICD-10-CM | POA: Diagnosis not present

## 2023-11-23 DIAGNOSIS — D2261 Melanocytic nevi of right upper limb, including shoulder: Secondary | ICD-10-CM | POA: Diagnosis not present

## 2023-11-23 DIAGNOSIS — Z85828 Personal history of other malignant neoplasm of skin: Secondary | ICD-10-CM | POA: Diagnosis not present

## 2023-12-15 DIAGNOSIS — H40153 Residual stage of open-angle glaucoma, bilateral: Secondary | ICD-10-CM | POA: Diagnosis not present

## 2024-01-04 DIAGNOSIS — D0439 Carcinoma in situ of skin of other parts of face: Secondary | ICD-10-CM | POA: Diagnosis not present

## 2024-01-12 DIAGNOSIS — E782 Mixed hyperlipidemia: Secondary | ICD-10-CM | POA: Diagnosis not present

## 2024-01-12 DIAGNOSIS — E7800 Pure hypercholesterolemia, unspecified: Secondary | ICD-10-CM | POA: Diagnosis not present

## 2024-01-12 DIAGNOSIS — I25810 Atherosclerosis of coronary artery bypass graft(s) without angina pectoris: Secondary | ICD-10-CM | POA: Diagnosis not present

## 2024-01-12 DIAGNOSIS — I10 Essential (primary) hypertension: Secondary | ICD-10-CM | POA: Diagnosis not present

## 2024-01-27 DIAGNOSIS — M79644 Pain in right finger(s): Secondary | ICD-10-CM | POA: Diagnosis not present

## 2024-01-27 DIAGNOSIS — Z0000 Encounter for general adult medical examination without abnormal findings: Secondary | ICD-10-CM | POA: Diagnosis not present

## 2024-01-27 DIAGNOSIS — I25810 Atherosclerosis of coronary artery bypass graft(s) without angina pectoris: Secondary | ICD-10-CM | POA: Diagnosis not present

## 2024-01-27 DIAGNOSIS — I10 Essential (primary) hypertension: Secondary | ICD-10-CM | POA: Diagnosis not present

## 2024-01-27 DIAGNOSIS — E7800 Pure hypercholesterolemia, unspecified: Secondary | ICD-10-CM | POA: Diagnosis not present

## 2024-02-16 DIAGNOSIS — M65311 Trigger thumb, right thumb: Secondary | ICD-10-CM | POA: Diagnosis not present

## 2024-02-16 DIAGNOSIS — M65312 Trigger thumb, left thumb: Secondary | ICD-10-CM | POA: Diagnosis not present

## 2024-05-16 DIAGNOSIS — H40153 Residual stage of open-angle glaucoma, bilateral: Secondary | ICD-10-CM | POA: Diagnosis not present

## 2024-06-02 DIAGNOSIS — D2261 Melanocytic nevi of right upper limb, including shoulder: Secondary | ICD-10-CM | POA: Diagnosis not present

## 2024-06-02 DIAGNOSIS — D225 Melanocytic nevi of trunk: Secondary | ICD-10-CM | POA: Diagnosis not present

## 2024-06-02 DIAGNOSIS — D2272 Melanocytic nevi of left lower limb, including hip: Secondary | ICD-10-CM | POA: Diagnosis not present

## 2024-06-02 DIAGNOSIS — L820 Inflamed seborrheic keratosis: Secondary | ICD-10-CM | POA: Diagnosis not present

## 2024-06-02 DIAGNOSIS — D485 Neoplasm of uncertain behavior of skin: Secondary | ICD-10-CM | POA: Diagnosis not present

## 2024-06-02 DIAGNOSIS — Z85828 Personal history of other malignant neoplasm of skin: Secondary | ICD-10-CM | POA: Diagnosis not present

## 2024-06-02 DIAGNOSIS — L570 Actinic keratosis: Secondary | ICD-10-CM | POA: Diagnosis not present

## 2024-06-02 DIAGNOSIS — D2262 Melanocytic nevi of left upper limb, including shoulder: Secondary | ICD-10-CM | POA: Diagnosis not present

## 2024-06-02 DIAGNOSIS — L821 Other seborrheic keratosis: Secondary | ICD-10-CM | POA: Diagnosis not present

## 2024-07-13 DIAGNOSIS — E782 Mixed hyperlipidemia: Secondary | ICD-10-CM | POA: Diagnosis not present

## 2024-07-13 DIAGNOSIS — E7800 Pure hypercholesterolemia, unspecified: Secondary | ICD-10-CM | POA: Diagnosis not present

## 2024-07-13 DIAGNOSIS — I10 Essential (primary) hypertension: Secondary | ICD-10-CM | POA: Diagnosis not present

## 2024-07-13 DIAGNOSIS — I25810 Atherosclerosis of coronary artery bypass graft(s) without angina pectoris: Secondary | ICD-10-CM | POA: Diagnosis not present

## 2024-07-28 DIAGNOSIS — Z1331 Encounter for screening for depression: Secondary | ICD-10-CM | POA: Diagnosis not present

## 2024-07-28 DIAGNOSIS — M170 Bilateral primary osteoarthritis of knee: Secondary | ICD-10-CM | POA: Diagnosis not present

## 2024-07-28 DIAGNOSIS — I25810 Atherosclerosis of coronary artery bypass graft(s) without angina pectoris: Secondary | ICD-10-CM | POA: Diagnosis not present

## 2024-07-28 DIAGNOSIS — M545 Low back pain, unspecified: Secondary | ICD-10-CM | POA: Diagnosis not present

## 2024-09-15 DIAGNOSIS — H40153 Residual stage of open-angle glaucoma, bilateral: Secondary | ICD-10-CM | POA: Diagnosis not present
